# Patient Record
Sex: Female | Born: 1935
Health system: Southern US, Community
[De-identification: ages and names within clinical notes are randomized; demographics above are authoritative.]

## PROBLEM LIST (undated history)

## (undated) DIAGNOSIS — G459 Transient cerebral ischemic attack, unspecified: Secondary | ICD-10-CM

## (undated) DIAGNOSIS — M1990 Unspecified osteoarthritis, unspecified site: Secondary | ICD-10-CM

## (undated) DIAGNOSIS — I10 Essential (primary) hypertension: Secondary | ICD-10-CM

## (undated) DIAGNOSIS — F419 Anxiety disorder, unspecified: Secondary | ICD-10-CM

## (undated) DIAGNOSIS — E785 Hyperlipidemia, unspecified: Secondary | ICD-10-CM

## (undated) DIAGNOSIS — K645 Perianal venous thrombosis: Secondary | ICD-10-CM

## (undated) DIAGNOSIS — C50919 Malignant neoplasm of unspecified site of unspecified female breast: Secondary | ICD-10-CM

## (undated) DIAGNOSIS — R05 Cough: Secondary | ICD-10-CM

## (undated) DIAGNOSIS — K219 Gastro-esophageal reflux disease without esophagitis: Secondary | ICD-10-CM

## (undated) DIAGNOSIS — K222 Esophageal obstruction: Secondary | ICD-10-CM

## (undated) DIAGNOSIS — R059 Cough, unspecified: Secondary | ICD-10-CM

## (undated) HISTORY — DX: Perianal venous thrombosis: K64.5

## (undated) HISTORY — PX: ABDOMINAL HYSTERECTOMY: SHX81

## (undated) HISTORY — DX: Esophageal obstruction: K22.2

## (undated) HISTORY — PX: TONSILLECTOMY: SUR1361

## (undated) HISTORY — PX: MASTECTOMY, PARTIAL: SHX709

## (undated) HISTORY — PX: CYSTECTOMY: SUR359

## (undated) HISTORY — PX: HEEL SPUR SURGERY: SHX665

## (undated) HISTORY — DX: Cough, unspecified: R05.9

## (undated) HISTORY — DX: Cough: R05

## (undated) HISTORY — DX: Essential (primary) hypertension: I10

## (undated) HISTORY — DX: Malignant neoplasm of unspecified site of unspecified female breast: C50.919

## (undated) HISTORY — DX: Gastro-esophageal reflux disease without esophagitis: K21.9

## (undated) HISTORY — DX: Anxiety disorder, unspecified: F41.9

## (undated) HISTORY — DX: Hyperlipidemia, unspecified: E78.5

## (undated) HISTORY — DX: Transient cerebral ischemic attack, unspecified: G45.9

## (undated) HISTORY — DX: Unspecified osteoarthritis, unspecified site: M19.90

---

## 1998-03-15 ENCOUNTER — Ambulatory Visit (HOSPITAL_COMMUNITY): Admission: RE | Admit: 1998-03-15 | Discharge: 1998-03-15 | Payer: Self-pay | Admitting: Cardiology

## 1998-03-30 ENCOUNTER — Observation Stay (HOSPITAL_COMMUNITY): Admission: RE | Admit: 1998-03-30 | Discharge: 1998-03-31 | Payer: Self-pay | Admitting: Surgery

## 1999-10-16 ENCOUNTER — Encounter: Admission: RE | Admit: 1999-10-16 | Discharge: 1999-10-16 | Payer: Self-pay | Admitting: Cardiology

## 1999-10-16 ENCOUNTER — Encounter: Payer: Self-pay | Admitting: Cardiology

## 2000-10-07 HISTORY — PX: CHOLECYSTECTOMY: SHX55

## 2000-10-17 ENCOUNTER — Encounter: Admission: RE | Admit: 2000-10-17 | Discharge: 2000-10-17 | Payer: Self-pay | Admitting: Internal Medicine

## 2000-10-17 ENCOUNTER — Encounter: Payer: Self-pay | Admitting: Internal Medicine

## 2000-12-16 ENCOUNTER — Encounter: Payer: Self-pay | Admitting: Internal Medicine

## 2000-12-16 ENCOUNTER — Ambulatory Visit (HOSPITAL_COMMUNITY): Admission: RE | Admit: 2000-12-16 | Discharge: 2000-12-16 | Payer: Self-pay | Admitting: Internal Medicine

## 2001-02-03 ENCOUNTER — Other Ambulatory Visit: Admission: RE | Admit: 2001-02-03 | Discharge: 2001-02-03 | Payer: Self-pay | Admitting: Internal Medicine

## 2001-04-21 ENCOUNTER — Encounter: Admission: RE | Admit: 2001-04-21 | Discharge: 2001-04-21 | Payer: Self-pay | Admitting: *Deleted

## 2001-04-21 ENCOUNTER — Encounter: Payer: Self-pay | Admitting: *Deleted

## 2001-07-31 ENCOUNTER — Encounter: Payer: Self-pay | Admitting: *Deleted

## 2001-07-31 ENCOUNTER — Encounter: Admission: RE | Admit: 2001-07-31 | Discharge: 2001-07-31 | Payer: Self-pay | Admitting: *Deleted

## 2001-10-19 ENCOUNTER — Encounter: Admission: RE | Admit: 2001-10-19 | Discharge: 2001-10-19 | Payer: Self-pay | Admitting: Internal Medicine

## 2001-10-19 ENCOUNTER — Encounter: Payer: Self-pay | Admitting: Internal Medicine

## 2002-10-20 ENCOUNTER — Encounter: Admission: RE | Admit: 2002-10-20 | Discharge: 2002-10-20 | Payer: Self-pay | Admitting: Internal Medicine

## 2002-10-20 ENCOUNTER — Encounter: Payer: Self-pay | Admitting: Internal Medicine

## 2002-10-22 ENCOUNTER — Encounter: Payer: Self-pay | Admitting: Internal Medicine

## 2002-10-22 ENCOUNTER — Encounter: Admission: RE | Admit: 2002-10-22 | Discharge: 2002-10-22 | Payer: Self-pay | Admitting: Internal Medicine

## 2002-10-27 ENCOUNTER — Ambulatory Visit (HOSPITAL_COMMUNITY): Admission: RE | Admit: 2002-10-27 | Discharge: 2002-10-27 | Payer: Self-pay | Admitting: Internal Medicine

## 2002-10-27 ENCOUNTER — Encounter: Payer: Self-pay | Admitting: Internal Medicine

## 2003-03-31 ENCOUNTER — Encounter: Payer: Self-pay | Admitting: Internal Medicine

## 2003-03-31 ENCOUNTER — Encounter: Admission: RE | Admit: 2003-03-31 | Discharge: 2003-03-31 | Payer: Self-pay | Admitting: Internal Medicine

## 2004-04-17 ENCOUNTER — Encounter: Admission: RE | Admit: 2004-04-17 | Discharge: 2004-04-17 | Payer: Self-pay | Admitting: Internal Medicine

## 2004-08-08 ENCOUNTER — Ambulatory Visit: Payer: Self-pay | Admitting: Internal Medicine

## 2004-08-21 ENCOUNTER — Ambulatory Visit: Payer: Self-pay | Admitting: Internal Medicine

## 2004-08-21 ENCOUNTER — Encounter (INDEPENDENT_AMBULATORY_CARE_PROVIDER_SITE_OTHER): Payer: Self-pay | Admitting: *Deleted

## 2005-04-24 ENCOUNTER — Encounter: Admission: RE | Admit: 2005-04-24 | Discharge: 2005-04-24 | Payer: Self-pay | Admitting: Internal Medicine

## 2005-05-11 ENCOUNTER — Emergency Department (HOSPITAL_COMMUNITY): Admission: EM | Admit: 2005-05-11 | Discharge: 2005-05-11 | Payer: Self-pay | Admitting: Emergency Medicine

## 2006-04-29 ENCOUNTER — Encounter: Admission: RE | Admit: 2006-04-29 | Discharge: 2006-04-29 | Payer: Self-pay | Admitting: Internal Medicine

## 2007-05-01 ENCOUNTER — Encounter: Admission: RE | Admit: 2007-05-01 | Discharge: 2007-05-01 | Payer: Self-pay | Admitting: Internal Medicine

## 2007-05-08 ENCOUNTER — Ambulatory Visit (HOSPITAL_COMMUNITY): Admission: RE | Admit: 2007-05-08 | Discharge: 2007-05-08 | Payer: Self-pay | Admitting: Internal Medicine

## 2007-08-27 ENCOUNTER — Encounter (HOSPITAL_COMMUNITY): Admission: RE | Admit: 2007-08-27 | Discharge: 2007-10-07 | Payer: Self-pay | Admitting: Internal Medicine

## 2008-01-13 ENCOUNTER — Encounter: Admission: RE | Admit: 2008-01-13 | Discharge: 2008-01-13 | Payer: Self-pay | Admitting: Internal Medicine

## 2008-04-15 DIAGNOSIS — K222 Esophageal obstruction: Secondary | ICD-10-CM

## 2008-04-15 DIAGNOSIS — E785 Hyperlipidemia, unspecified: Secondary | ICD-10-CM | POA: Insufficient documentation

## 2008-04-15 DIAGNOSIS — M129 Arthropathy, unspecified: Secondary | ICD-10-CM | POA: Insufficient documentation

## 2008-04-15 DIAGNOSIS — K219 Gastro-esophageal reflux disease without esophagitis: Secondary | ICD-10-CM

## 2008-04-15 DIAGNOSIS — I10 Essential (primary) hypertension: Secondary | ICD-10-CM | POA: Insufficient documentation

## 2008-04-15 DIAGNOSIS — G459 Transient cerebral ischemic attack, unspecified: Secondary | ICD-10-CM | POA: Insufficient documentation

## 2008-04-18 ENCOUNTER — Ambulatory Visit: Payer: Self-pay | Admitting: Internal Medicine

## 2008-04-18 DIAGNOSIS — K644 Residual hemorrhoidal skin tags: Secondary | ICD-10-CM | POA: Insufficient documentation

## 2008-04-18 DIAGNOSIS — K5900 Constipation, unspecified: Secondary | ICD-10-CM | POA: Insufficient documentation

## 2008-04-18 LAB — CONVERTED CEMR LAB
Basophils Relative: 0.4 % (ref 0.0–1.0)
Eosinophils Absolute: 0.2 10*3/uL (ref 0.0–0.7)
Eosinophils Relative: 3.3 % (ref 0.0–5.0)
HCT: 39.4 % (ref 36.0–46.0)
Lymphocytes Relative: 31.1 % (ref 12.0–46.0)
MCV: 88 fL (ref 78.0–100.0)
Neutro Abs: 4.1 10*3/uL (ref 1.4–7.7)
Platelets: 280 10*3/uL (ref 150–400)
RBC: 4.48 M/uL (ref 3.87–5.11)

## 2008-04-19 ENCOUNTER — Ambulatory Visit: Payer: Self-pay | Admitting: Internal Medicine

## 2008-04-25 ENCOUNTER — Ambulatory Visit: Payer: Self-pay | Admitting: Internal Medicine

## 2008-04-28 LAB — CONVERTED CEMR LAB
OCCULT 1: POSITIVE — AB
OCCULT 2: POSITIVE — AB
OCCULT 4: NEGATIVE

## 2008-05-03 ENCOUNTER — Encounter: Admission: RE | Admit: 2008-05-03 | Discharge: 2008-05-03 | Payer: Self-pay | Admitting: Internal Medicine

## 2008-10-07 HISTORY — PX: BREAST LUMPECTOMY: SHX2

## 2009-05-05 ENCOUNTER — Encounter: Admission: RE | Admit: 2009-05-05 | Discharge: 2009-05-05 | Payer: Self-pay | Admitting: Internal Medicine

## 2009-05-11 ENCOUNTER — Encounter (INDEPENDENT_AMBULATORY_CARE_PROVIDER_SITE_OTHER): Payer: Self-pay | Admitting: Internal Medicine

## 2009-05-11 ENCOUNTER — Encounter: Admission: RE | Admit: 2009-05-11 | Discharge: 2009-05-11 | Payer: Self-pay | Admitting: Internal Medicine

## 2009-05-17 ENCOUNTER — Encounter: Admission: RE | Admit: 2009-05-17 | Discharge: 2009-05-17 | Payer: Self-pay | Admitting: Internal Medicine

## 2009-05-29 ENCOUNTER — Encounter (INDEPENDENT_AMBULATORY_CARE_PROVIDER_SITE_OTHER): Payer: Self-pay | Admitting: General Surgery

## 2009-05-29 ENCOUNTER — Encounter: Admission: RE | Admit: 2009-05-29 | Discharge: 2009-05-29 | Payer: Self-pay | Admitting: General Surgery

## 2009-05-29 ENCOUNTER — Ambulatory Visit (HOSPITAL_COMMUNITY): Admission: RE | Admit: 2009-05-29 | Discharge: 2009-05-29 | Payer: Self-pay | Admitting: General Surgery

## 2009-06-07 ENCOUNTER — Ambulatory Visit: Payer: Self-pay | Admitting: Oncology

## 2009-06-14 LAB — CBC WITH DIFFERENTIAL/PLATELET
BASO%: 0.6 % (ref 0.0–2.0)
EOS%: 6.9 % (ref 0.0–7.0)
LYMPH%: 32 % (ref 14.0–49.7)
MCH: 30 pg (ref 25.1–34.0)
MCHC: 34.6 g/dL (ref 31.5–36.0)
MONO#: 0.5 10*3/uL (ref 0.1–0.9)
NEUT%: 52.4 % (ref 38.4–76.8)
Platelets: 260 10*3/uL (ref 145–400)
RBC: 4.52 10*6/uL (ref 3.70–5.45)
WBC: 5.7 10*3/uL (ref 3.9–10.3)
lymph#: 1.8 10*3/uL (ref 0.9–3.3)

## 2009-06-15 LAB — COMPREHENSIVE METABOLIC PANEL
ALT: 20 U/L (ref 0–35)
AST: 22 U/L (ref 0–37)
CO2: 27 mEq/L (ref 19–32)
Creatinine, Ser: 1.09 mg/dL (ref 0.40–1.20)
Sodium: 142 mEq/L (ref 135–145)
Total Bilirubin: 0.5 mg/dL (ref 0.3–1.2)
Total Protein: 6.7 g/dL (ref 6.0–8.3)

## 2009-06-15 LAB — LACTATE DEHYDROGENASE: LDH: 175 U/L (ref 94–250)

## 2009-06-23 ENCOUNTER — Ambulatory Visit (HOSPITAL_COMMUNITY): Admission: RE | Admit: 2009-06-23 | Discharge: 2009-06-23 | Payer: Self-pay | Admitting: Oncology

## 2009-09-22 ENCOUNTER — Ambulatory Visit: Payer: Self-pay | Admitting: Oncology

## 2009-09-26 LAB — CBC WITH DIFFERENTIAL/PLATELET
BASO%: 0.7 % (ref 0.0–2.0)
Eosinophils Absolute: 0.2 10*3/uL (ref 0.0–0.5)
MONO#: 0.4 10*3/uL (ref 0.1–0.9)
NEUT#: 3.8 10*3/uL (ref 1.5–6.5)
RBC: 4.59 10*6/uL (ref 3.70–5.45)
RDW: 12.7 % (ref 11.2–14.5)
WBC: 6.6 10*3/uL (ref 3.9–10.3)
lymph#: 2.2 10*3/uL (ref 0.9–3.3)

## 2009-09-26 LAB — COMPREHENSIVE METABOLIC PANEL
ALT: 19 U/L (ref 0–35)
Albumin: 4.3 g/dL (ref 3.5–5.2)
CO2: 27 mEq/L (ref 19–32)
Glucose, Bld: 104 mg/dL — ABNORMAL HIGH (ref 70–99)
Potassium: 3.8 mEq/L (ref 3.5–5.3)
Sodium: 139 mEq/L (ref 135–145)
Total Protein: 7.2 g/dL (ref 6.0–8.3)

## 2009-09-26 LAB — VITAMIN D 25 HYDROXY (VIT D DEFICIENCY, FRACTURES): Vit D, 25-Hydroxy: 40 ng/mL (ref 30–89)

## 2010-03-20 ENCOUNTER — Ambulatory Visit: Payer: Self-pay | Admitting: Oncology

## 2010-03-22 LAB — CBC WITH DIFFERENTIAL/PLATELET
BASO%: 0.5 % (ref 0.0–2.0)
Basophils Absolute: 0 10*3/uL (ref 0.0–0.1)
EOS%: 1.4 % (ref 0.0–7.0)
HGB: 13.4 g/dL (ref 11.6–15.9)
LYMPH%: 28.3 % (ref 14.0–49.7)
MONO%: 7.6 % (ref 0.0–14.0)
RBC: 4.63 10*6/uL (ref 3.70–5.45)
RDW: 12.6 % (ref 11.2–14.5)
lymph#: 1.7 10*3/uL (ref 0.9–3.3)

## 2010-03-23 LAB — COMPREHENSIVE METABOLIC PANEL
AST: 24 U/L (ref 0–37)
BUN: 21 mg/dL (ref 6–23)
CO2: 27 mEq/L (ref 19–32)
Calcium: 9.4 mg/dL (ref 8.4–10.5)
Potassium: 4 mEq/L (ref 3.5–5.3)
Sodium: 138 mEq/L (ref 135–145)
Total Bilirubin: 0.5 mg/dL (ref 0.3–1.2)

## 2010-05-30 ENCOUNTER — Encounter: Admission: RE | Admit: 2010-05-30 | Discharge: 2010-05-30 | Payer: Self-pay | Admitting: Oncology

## 2010-09-19 ENCOUNTER — Ambulatory Visit: Payer: Self-pay | Admitting: Oncology

## 2010-09-21 LAB — CBC WITH DIFFERENTIAL/PLATELET
BASO%: 0.3 % (ref 0.0–2.0)
Basophils Absolute: 0 10*3/uL (ref 0.0–0.1)
HCT: 39.4 % (ref 34.8–46.6)
LYMPH%: 23.5 % (ref 14.0–49.7)
MCH: 30 pg (ref 25.1–34.0)
MCHC: 33.9 g/dL (ref 31.5–36.0)
MONO#: 0.5 10*3/uL (ref 0.1–0.9)
NEUT#: 5 10*3/uL (ref 1.5–6.5)
NEUT%: 68.3 % (ref 38.4–76.8)
Platelets: 259 10*3/uL (ref 145–400)
RBC: 4.45 10*6/uL (ref 3.70–5.45)
WBC: 7.3 10*3/uL (ref 3.9–10.3)
lymph#: 1.7 10*3/uL (ref 0.9–3.3)

## 2010-09-22 LAB — LACTATE DEHYDROGENASE: LDH: 164 U/L (ref 94–250)

## 2010-09-22 LAB — COMPREHENSIVE METABOLIC PANEL
Albumin: 4.2 g/dL (ref 3.5–5.2)
BUN: 26 mg/dL — ABNORMAL HIGH (ref 6–23)
Calcium: 9.6 mg/dL (ref 8.4–10.5)
Chloride: 101 mEq/L (ref 96–112)
Glucose, Bld: 85 mg/dL (ref 70–99)
Potassium: 4.1 mEq/L (ref 3.5–5.3)

## 2010-09-22 LAB — CANCER ANTIGEN 27.29: CA 27.29: 25 U/mL (ref 0–39)

## 2011-01-12 LAB — CBC
HCT: 40.4 % (ref 36.0–46.0)
MCV: 88.9 fL (ref 78.0–100.0)
Platelets: 241 10*3/uL (ref 150–400)
WBC: 8.2 10*3/uL (ref 4.0–10.5)

## 2011-01-12 LAB — DIFFERENTIAL
Basophils Absolute: 0 10*3/uL (ref 0.0–0.1)
Lymphocytes Relative: 28 % (ref 12–46)
Neutro Abs: 4.9 10*3/uL (ref 1.7–7.7)
Neutrophils Relative %: 60 % (ref 43–77)

## 2011-01-12 LAB — COMPREHENSIVE METABOLIC PANEL
Albumin: 4 g/dL (ref 3.5–5.2)
BUN: 18 mg/dL (ref 6–23)
CO2: 30 mEq/L (ref 19–32)
Chloride: 100 mEq/L (ref 96–112)
Creatinine, Ser: 1.08 mg/dL (ref 0.4–1.2)
GFR calc non Af Amer: 49 mL/min — ABNORMAL LOW (ref >60)
Glucose, Bld: 101 mg/dL — ABNORMAL HIGH (ref 70–99)
Total Bilirubin: 0.8 mg/dL (ref 0.3–1.2)

## 2011-01-12 LAB — URINE MICROSCOPIC-ADD ON

## 2011-01-12 LAB — URINALYSIS, ROUTINE W REFLEX MICROSCOPIC
Ketones, ur: NEGATIVE mg/dL
Nitrite: NEGATIVE
Protein, ur: NEGATIVE mg/dL

## 2011-01-12 LAB — LACTATE DEHYDROGENASE: LDH: 171 U/L (ref 94–250)

## 2011-02-15 ENCOUNTER — Encounter (INDEPENDENT_AMBULATORY_CARE_PROVIDER_SITE_OTHER): Payer: Self-pay | Admitting: General Surgery

## 2011-02-19 NOTE — Op Note (Signed)
[name], [name]               ACCOUNT NO.:  [id number]   MEDICAL RECORD NO.:  [id number]          PATIENT TYPE:  AMB   LOCATION:  SDS                          FACILITY:  [facility]   PHYSICIAN:  Angelia Mould. Derrell Lolling, M.D.DATE OF BIRTH:  1926/08/25   DATE OF PROCEDURE:  05/29/2009  DATE OF DISCHARGE:                               OPERATIVE REPORT   PREOPERATIVE DIAGNOSIS:  Invasive ductal carcinoma left breast, clinical  stage T1cN0.   POSTOPERATIVE DIAGNOSIS:  Invasive ductal carcinoma left breast,  clinical stage T1cN0.   OPERATION PERFORMED:  1. Inject blue dye left breast.  2. Left partial mastectomy with needle localization and specimen      mammogram.  3. Left axillary sentinel node mapping and biopsy.   SURGEON:  Angelia Mould. Derrell Lolling, MD   OPERATIVE INDICATIONS:  This is an 77 year old Caucasian female who had  screening mammograms recently which showed a small spiculated mass in  the left breast at the 1 o'clock position.  This was thought to be about  1.3 cm in maximum dimension.  Image-guided biopsy showed intermediate  grade invasive ductal carcinoma.  Her MRI showed a 1.9-cm mass in the  anterior third of the upper central left breast with a clip artifact.  This was a solitary lesion.  She was interested in breast conservation  surgery.  I discussed that with her.  I told her I thought she was a  reasonable candidate for that.  She is brought to the operating room  electively.   OPERATIVE TECHNIQUE:  The patient underwent wire localization at the  [facility] and the wire was placed from superiorly and  directed inferiorly and posteriorly and I went right through the marker  clip.  The patient was brought to the holding area at [facility].  Nuclear medicine technician injected the radionuclide into the left  subareolar area in the holding area.  The patient was then taken to the  operating room.  General anesthesia with an LMA device was used.  The  patient was identified as the correct patient, correct procedure and  correct site.  Following an alcohol prep, I injected 5 mL of blue dye  into the left subareolar area.  This was 2 mL of methylene blue mixed  with 3 mL of saline.  The breast was massaged for 5 minutes.   The left breast, shoulder, axilla and chest wall were then prepped and  draped in a sterile fashion.  Intravenous antibiotics were given.  Marcaine 0.5% with epinephrine was used as a local infiltration  anesthetic.  I used a marking pen and drew a somewhat transverse  circumareolar elliptical incision in the upper central left breast.  This encompassed the localizing wire.  This incision was made and  carried down deep into the breast tissue all around the localizing wire.  Inferiorly, I found some old blood thought to be left over hematoma from  the old biopsy and I had gone widely around this.  The specimen was  marked with the six color margin marker kit.  This was sent to the  [facility].  The radiologist took a look at the  specimen and said that the wire clip and the tumor was in the exact  center of the specimen and it looked very good.  The specimen was sent  to pathology for routine histology.  The left lumpectomy wound was  irrigated with saline.  Hemostasis was excellent and achieved with  electrocautery.  The deeper breast tissues were closed with interrupted  sutures of 3-0 Vicryl, and the skin closed with a running subcuticular  suture of 4-0 Monocryl and Dermabond.   We then used the NeoProbe and identified an area of focal radioactivity  in the left axilla.  Transverse incision was made at this site which was  about at the hairline.  Dissection was carried down through the  subcutaneous tissue.  The clavipectoral fascia was incised and we  entered the axilla.  We traced out and found one very hot very blue  lymph node and we found a second sentinel node which was hot but was not   blue.  These were both sent for routine histology.  The left axillary  wound looked good.  Hemostasis was excellent.  The deeper tissues were  closed with interrupted sutures of 3-0 Vicryl, and the skin closed with  a running subcuticular suture of 4-0 Monocryl and Dermabond.  The  patient was taken to the recovery room in stable condition.  Estimated  blood loss was about 25 mL.  Complications none.  Sponge, needle and  instrument counts were correct.      Angelia Mould. Derrell Lolling, M.D.  Electronically Signed     HMI/MEDQ  D:  05/29/2009  T:  05/30/2009  Job:  [id number]   cc:   Loraine Leriche A. Perini, M.D.

## 2011-03-26 ENCOUNTER — Other Ambulatory Visit: Payer: Self-pay | Admitting: Oncology

## 2011-03-26 ENCOUNTER — Encounter (HOSPITAL_BASED_OUTPATIENT_CLINIC_OR_DEPARTMENT_OTHER): Payer: BC Managed Care – HMO | Admitting: Oncology

## 2011-03-26 DIAGNOSIS — C50119 Malignant neoplasm of central portion of unspecified female breast: Secondary | ICD-10-CM

## 2011-03-26 LAB — CBC WITH DIFFERENTIAL/PLATELET
Basophils Absolute: 0 10*3/uL (ref 0.0–0.1)
Eosinophils Absolute: 0.2 10*3/uL (ref 0.0–0.5)
HGB: 13.1 g/dL (ref 11.6–15.9)
MCV: 87.1 fL (ref 79.5–101.0)
MONO%: 10.4 % (ref 0.0–14.0)
NEUT#: 2.7 10*3/uL (ref 1.5–6.5)
Platelets: 243 10*3/uL (ref 145–400)
RDW: 12.7 % (ref 11.2–14.5)

## 2011-03-27 LAB — VITAMIN D 25 HYDROXY (VIT D DEFICIENCY, FRACTURES): Vit D, 25-Hydroxy: 63 ng/mL (ref 30–89)

## 2011-03-27 LAB — COMPREHENSIVE METABOLIC PANEL
Albumin: 4.4 g/dL (ref 3.5–5.2)
Alkaline Phosphatase: 53 U/L (ref 39–117)
BUN: 26 mg/dL — ABNORMAL HIGH (ref 6–23)
CO2: 27 mEq/L (ref 19–32)
Calcium: 10.6 mg/dL — ABNORMAL HIGH (ref 8.4–10.5)
Glucose, Bld: 102 mg/dL — ABNORMAL HIGH (ref 70–99)
Potassium: 4 mEq/L (ref 3.5–5.3)

## 2011-04-02 ENCOUNTER — Encounter: Payer: Medicare Other | Admitting: Oncology

## 2011-04-02 ENCOUNTER — Other Ambulatory Visit: Payer: Self-pay | Admitting: Oncology

## 2011-04-02 DIAGNOSIS — Z853 Personal history of malignant neoplasm of breast: Secondary | ICD-10-CM

## 2011-05-29 ENCOUNTER — Encounter (INDEPENDENT_AMBULATORY_CARE_PROVIDER_SITE_OTHER): Payer: Self-pay | Admitting: General Surgery

## 2011-06-03 ENCOUNTER — Ambulatory Visit
Admission: RE | Admit: 2011-06-03 | Discharge: 2011-06-03 | Disposition: A | Discharge status: Home / Self Care | Payer: Medicare Other | Source: Ambulatory Visit | Attending: Oncology | Admitting: Oncology

## 2011-06-03 ENCOUNTER — Other Ambulatory Visit: Payer: Self-pay | Admitting: Oncology

## 2011-06-03 DIAGNOSIS — Z853 Personal history of malignant neoplasm of breast: Secondary | ICD-10-CM

## 2011-06-03 DIAGNOSIS — R921 Mammographic calcification found on diagnostic imaging of breast: Secondary | ICD-10-CM

## 2011-06-04 ENCOUNTER — Ambulatory Visit
Admission: RE | Admit: 2011-06-04 | Discharge: 2011-06-04 | Disposition: A | Discharge status: Home / Self Care | Payer: Medicare Other | Source: Ambulatory Visit | Attending: Oncology | Admitting: Oncology

## 2011-06-04 DIAGNOSIS — R921 Mammographic calcification found on diagnostic imaging of breast: Secondary | ICD-10-CM

## 2011-06-05 ENCOUNTER — Other Ambulatory Visit: Payer: Self-pay | Admitting: Oncology

## 2011-06-05 DIAGNOSIS — R921 Mammographic calcification found on diagnostic imaging of breast: Secondary | ICD-10-CM

## 2011-06-20 ENCOUNTER — Encounter (INDEPENDENT_AMBULATORY_CARE_PROVIDER_SITE_OTHER): Payer: Self-pay | Admitting: General Surgery

## 2011-06-20 ENCOUNTER — Ambulatory Visit (INDEPENDENT_AMBULATORY_CARE_PROVIDER_SITE_OTHER): Payer: Medicare Other | Admitting: General Surgery

## 2011-06-20 ENCOUNTER — Other Ambulatory Visit (INDEPENDENT_AMBULATORY_CARE_PROVIDER_SITE_OTHER): Payer: Self-pay | Admitting: General Surgery

## 2011-06-20 VITALS — BP 130/70 | HR 64 | Temp 97.4°F | Ht 65.75 in | Wt 167.8 lb

## 2011-06-20 DIAGNOSIS — R921 Mammographic calcification found on diagnostic imaging of breast: Secondary | ICD-10-CM

## 2011-06-20 DIAGNOSIS — Z853 Personal history of malignant neoplasm of breast: Secondary | ICD-10-CM

## 2011-06-20 DIAGNOSIS — N6089 Other benign mammary dysplasias of unspecified breast: Secondary | ICD-10-CM

## 2011-06-20 DIAGNOSIS — N6099 Unspecified benign mammary dysplasia of unspecified breast: Secondary | ICD-10-CM

## 2011-06-20 NOTE — Patient Instructions (Signed)
The recent biopsy of your left breast revealed atypical ductal hyperplasia, which is a precancerous condition. The area of calcifications behind the lumpectomy site need to be completely removed to find out whether there is any cancer present or not. I think that this is the next step. We will schedule the surgery to be done in the near future.

## 2011-06-20 NOTE — Progress Notes (Signed)
Chief Complaint  Patient presents with  . Other    est pt new prob- eval lt br atypical hyperplasia     HPI [name] is a 77 y.o. female.    This patient underwent left partial mastectomy and left sentinel node biopsy on May 29, 2009. Final pathologic stage was T1c., N0, (i positive), receptor positive, Ki-67 60%, HER-2-negative.  She had adjuvant radiation therapy. Dr. Donnie Coffin has been seeinn her every 6 months. She has had lots of side effects from her antiestrogen therapy and is known exemestane..  She had mammograms on June 03, 2011. This showed an increasing area of microcalcifications posterior to the left lumpectomy site. Image guided biopsy showed atypical ductal hyperplasia. She was referred to me for consideration of excisional biopsy to rule out occult carcinoma.  She has asked me whether she needs a mastectomy, and I have told her that that does not appear to be necessary, at least at this point in time. HPI  Past Medical History  Diagnosis Date  . Hyperlipidemia   . Hypertension   . Cancer     breast - lft    Past Surgical History  Procedure Date  . Abdominal hysterectomy   . Cystectomy in her 20's  . Cholecystectomy 2002  . Tonsillectomy   . Breast lumpectomy 2010    left    Family History  Problem Relation Age of Onset  . Cancer Brother     lung  . Cancer Sister     cervical    Social History History  Substance Use Topics  . Smoking status: Never Smoker   . Smokeless tobacco: Not on file  . Alcohol Use: No    Allergies  Allergen Reactions  . Ibandronate Sodium     REACTION: joint aches  . Sulfonamide Derivatives     Current Outpatient Prescriptions  Medication Sig Dispense Refill  . ALPRAZolam (XANAX) 0.5 MG tablet Take 0.5 mg by mouth at bedtime as needed.        Marland Kitchen amitriptyline (ELAVIL) 50 MG tablet daily.      . ATENOLOL PO Take by mouth.        Marland Kitchen BIOTIN PO Take by mouth daily.        . calcium carbonate (OS-CAL) 600 MG  TABS Take 600 mg by mouth daily.        . Cholecalciferol (VITAMIN D3) 2000 UNITS TABS Take by mouth daily.        Marland Kitchen exemestane (AROMASIN) 25 MG tablet daily.      . felodipine (PLENDIL) 10 MG 24 hr tablet daily.      . fish oil-omega-3 fatty acids 1000 MG capsule Take 2 g by mouth daily.        . furosemide (LASIX) 40 MG tablet daily.      Marland Kitchen levothyroxine (SYNTHROID, LEVOTHROID) 50 MCG tablet Ad lib.      Ailene Ards 3-6-9 Fatty Acids (TRIPLE OMEGA COMPLEX PO) Take by mouth daily.        . potassium chloride SA (K-DUR,KLOR-CON) 20 MEQ tablet daily.      . Ranitidine HCl (ZANTAC PO) Take by mouth.        . Rosuvastatin Calcium (CRESTOR PO) Take by mouth.        . Valsartan (DIOVAN PO) Take by mouth.          Review of Systems Review of Systems 12 system review of systems is performed and is negative except as described above. Blood pressure 130/70,  pulse 64, temperature 97.4 F (36.3 C), temperature source Temporal, height 5' 5.75" (1.67 m), weight 167 lb 12.8 oz (76.114 kg).  Physical Exam Physical Exam The patient is elderly, slightly deconditioned, and ambulating independently and otherwise alert and has good insight into her problems.  Neck reveals no adenopathy or mass. There is no jugular venous distention.  Lungs clear to auscultation her chest wall tenderness.  Heart regular rate and rhythm. No ectopy. No murmur.  Left breast reveals a well-healed transverse lobectomy scar superiorly. I do not feel any masses in either breast. There is no skin change in either breast otherwise. There is no axillary adenopathy. Data Reviewed I have reviewed her recent mammograms and the mammogram report. I reviewed the pathology  report and Dr. Renelda Loma recent office notes.  Assessment    Atypical ductal hyperplasia, left breast, posterior to lumpectomy site. This area should be excised to exclude occult carcinoma.  Invasive carcinoma left breast, pathologic stage TIc., N0, receptor positive,  HER-2-negative. Status post left partial mastectomy and sentinel biopsy May 29, 2009.  Hypertension.  Status post hysterectomy and right salpingo-oophorectomy.  Status post cholecystectomy.  Anxiety.     Plan    We had a long discussion. We decided to proceed with left partial mastectomy with needle localization in the near future.  We discussed the indications and details of the surgery. Risks and complications  were outlined, including but not limited to bleeding, infection, cosmetic deformity, reoperation if cancer is found, cardiac pulmonary and thromboembolic problems. She understands these issues well. At this time all of her questions are answered. She is in full agreement with this plan.       Rickard Kennerly M 06/20/2011, 10:23 AM

## 2011-06-26 ENCOUNTER — Encounter (HOSPITAL_COMMUNITY)
Admission: RE | Admit: 2011-06-26 | Discharge: 2011-06-26 | Disposition: A | Discharge status: Home / Self Care | Payer: Medicare Other | Source: Ambulatory Visit | Attending: General Surgery | Admitting: General Surgery

## 2011-06-26 ENCOUNTER — Other Ambulatory Visit (INDEPENDENT_AMBULATORY_CARE_PROVIDER_SITE_OTHER): Payer: Self-pay | Admitting: General Surgery

## 2011-06-26 DIAGNOSIS — R921 Mammographic calcification found on diagnostic imaging of breast: Secondary | ICD-10-CM

## 2011-06-26 LAB — DIFFERENTIAL
Basophils Relative: 1 % (ref 0–1)
Lymphocytes Relative: 31 % (ref 12–46)
Lymphs Abs: 2.1 10*3/uL (ref 0.7–4.0)
Monocytes Relative: 12 % (ref 3–12)
Neutro Abs: 3.3 10*3/uL (ref 1.7–7.7)
Neutrophils Relative %: 50 % (ref 43–77)

## 2011-06-26 LAB — CBC
HCT: 38.7 % (ref 36.0–46.0)
Hemoglobin: 13.4 g/dL (ref 12.0–15.0)
MCH: 29.7 pg (ref 26.0–34.0)
MCV: 85.8 fL (ref 78.0–100.0)
RBC: 4.51 MIL/uL (ref 3.87–5.11)

## 2011-06-26 LAB — COMPREHENSIVE METABOLIC PANEL
ALT: 20 U/L (ref 0–35)
BUN: 22 mg/dL (ref 6–23)
CO2: 31 mEq/L (ref 19–32)
Calcium: 10.2 mg/dL (ref 8.4–10.5)
GFR calc Af Amer: 46 mL/min — ABNORMAL LOW (ref >60)
GFR calc non Af Amer: 38 mL/min — ABNORMAL LOW (ref >60)
Glucose, Bld: 98 mg/dL (ref 70–99)
Sodium: 141 mEq/L (ref 135–145)

## 2011-06-26 LAB — SURGICAL PCR SCREEN: MRSA, PCR: NEGATIVE

## 2011-07-01 ENCOUNTER — Ambulatory Visit (INDEPENDENT_AMBULATORY_CARE_PROVIDER_SITE_OTHER): Payer: Medicare Other | Admitting: General Surgery

## 2011-07-03 ENCOUNTER — Ambulatory Visit
Admission: RE | Admit: 2011-07-03 | Discharge: 2011-07-03 | Disposition: A | Discharge status: Home / Self Care | Payer: Medicare Other | Source: Ambulatory Visit | Attending: General Surgery | Admitting: General Surgery

## 2011-07-03 ENCOUNTER — Other Ambulatory Visit (INDEPENDENT_AMBULATORY_CARE_PROVIDER_SITE_OTHER): Payer: Self-pay | Admitting: General Surgery

## 2011-07-03 ENCOUNTER — Ambulatory Visit (HOSPITAL_COMMUNITY)
Admission: RE | Admit: 2011-07-03 | Discharge: 2011-07-03 | Disposition: A | Discharge status: Home / Self Care | Payer: Medicare Other | Source: Ambulatory Visit | Attending: General Surgery | Admitting: General Surgery

## 2011-07-03 DIAGNOSIS — R920 Mammographic microcalcification found on diagnostic imaging of breast: Secondary | ICD-10-CM

## 2011-07-03 DIAGNOSIS — R921 Mammographic calcification found on diagnostic imaging of breast: Secondary | ICD-10-CM

## 2011-07-03 DIAGNOSIS — Z01818 Encounter for other preprocedural examination: Secondary | ICD-10-CM | POA: Insufficient documentation

## 2011-07-03 DIAGNOSIS — Z853 Personal history of malignant neoplasm of breast: Secondary | ICD-10-CM | POA: Insufficient documentation

## 2011-07-03 DIAGNOSIS — I10 Essential (primary) hypertension: Secondary | ICD-10-CM | POA: Insufficient documentation

## 2011-07-03 DIAGNOSIS — Z8673 Personal history of transient ischemic attack (TIA), and cerebral infarction without residual deficits: Secondary | ICD-10-CM | POA: Insufficient documentation

## 2011-07-03 DIAGNOSIS — Z01812 Encounter for preprocedural laboratory examination: Secondary | ICD-10-CM | POA: Insufficient documentation

## 2011-07-03 DIAGNOSIS — I498 Other specified cardiac arrhythmias: Secondary | ICD-10-CM | POA: Insufficient documentation

## 2011-07-03 DIAGNOSIS — N6089 Other benign mammary dysplasias of unspecified breast: Secondary | ICD-10-CM | POA: Insufficient documentation

## 2011-07-03 DIAGNOSIS — K219 Gastro-esophageal reflux disease without esophagitis: Secondary | ICD-10-CM | POA: Insufficient documentation

## 2011-07-03 DIAGNOSIS — Z01810 Encounter for preprocedural cardiovascular examination: Secondary | ICD-10-CM | POA: Insufficient documentation

## 2011-07-03 LAB — URINALYSIS, ROUTINE W REFLEX MICROSCOPIC
Glucose, UA: NEGATIVE mg/dL
Hgb urine dipstick: NEGATIVE
Ketones, ur: NEGATIVE mg/dL
Leukocytes, UA: NEGATIVE
Protein, ur: NEGATIVE mg/dL

## 2011-07-10 ENCOUNTER — Telehealth (INDEPENDENT_AMBULATORY_CARE_PROVIDER_SITE_OTHER): Payer: Self-pay | Admitting: General Surgery

## 2011-07-13 NOTE — Op Note (Signed)
NAMESHERMA, VANMETRE NO.:  [id number]  MEDICAL RECORD NO.:  [id number]  LOCATION:  SDSC                         FACILITY:  [facility]  PHYSICIAN:  Angelia Mould. Derrell Lolling, M.D.DATE OF BIRTH:  [birth year]  DATE OF PROCEDURE:  07/03/2011 DATE OF DISCHARGE:                              OPERATIVE REPORT   PREOPERATIVE DIAGNOSES: 1. Atypical ductal hyperplasia, left breast. 2. History left partial mastectomy and sentinel node biopsy for     invasive ductal carcinoma.  POSTOPERATIVE DIAGNOSES: 1. Atypical ductal hyperplasia, left breast. 2. History left partial mastectomy and sentinel node biopsy for     invasive ductal carcinoma.  OPERATION PERFORMED:  Left partial mastectomy with needle localization.  SURGEON:  Angelia Mould. Derrell Lolling, MD.  OPERATIVE INDICATIONS:  This is an 77 year old female who has a history of a left partial mastectomy and left sentinel node biopsy performed on May 29, 2009.  Pathologic stage of her tumor was T1c, N0, (i+), receptor positive, Ki67 60%, HER2 negative.  She had adjuvant radiation therapy and adjuvant antiestrogen therapy and has no known recurrence to date.  Recent mammograms show an area of increasing microcalcifications in the left breast, posterior and slightly lateral to the lumpectomy site.  Image-guided biopsy showed atypical ductal hyperplasia.  It was felt appropriate to completely excise this area to rule out occult breast cancer.  The patient agreed with this.  She was brought to operating room electively.  OPERATIVE TECHNIQUE:  The patient underwent wire localization of the area of microcalcifications by Dr. Cain Saupe.  The wire entered laterally and went medially through the area of interest.  The patient was then brought to [facility].  She was taken to operating room. General anesthesia was induced.  The left breast was prepped and draped in a sterile fashion.  Surgical time-out was held and identifying correct  patient, correct procedure, and correct site.  0.5% Marcaine with epinephrine was used as a local infiltration anesthetic.  A transverse radially oriented incision was made in the left breast at the 3 o'clock position.  Dissection was carried down through the breast tissue and around the localizing wire.  We took the dissection very posterior and somewhat medial as indicated by the wire and the imaging studies.  The specimen was removed and was marked with the 6-color margin marker kit.  The specimen mammogram was performed and confirmed removal of the wire intact and the area of calcifications and marker clip completely.  The specimen was sent to pathology.  Hemostasis was excellent and achieved with electrocautery.  The wound was irrigated with saline.  The deeper breast tissues were reapproximated with interrupted sutures of 3-0 Vicryl and the skin closed with running subcuticular suture of 4-0 Monocryl and Dermabond.  The patient tolerated the procedure well and was taken to recovery room in stable condition.  Estimated blood loss was about 10 mL.  Complications none. Sponge, needle, and instrument counts were correct.     Angelia Mould. Derrell Lolling, M.D.     HMI/MEDQ  D:  07/03/2011  T:  07/03/2011  Job:  [id number]  cc:   Loraine Leriche A. Perini, M.D. Pierce Crane, M.D., F.R.C.P.C. [place], MD  Electronically Signed  by Claud Kelp M.D. on 07/13/2011 04:02:50 PM

## 2011-07-15 ENCOUNTER — Telehealth (INDEPENDENT_AMBULATORY_CARE_PROVIDER_SITE_OTHER): Payer: Self-pay | Admitting: General Surgery

## 2011-07-15 NOTE — Telephone Encounter (Signed)
Message copied by Latricia Heft on Mon Jul 15, 2011  2:38 PM ------      Message from: Ernestene Mention      Created: Mon Jul 15, 2011 12:40 PM       Be sure we have called path report to patient.

## 2011-07-15 NOTE — Telephone Encounter (Signed)
Contacted the patient and gave her results to pathology.

## 2011-07-22 ENCOUNTER — Telehealth: Payer: Self-pay | Admitting: Internal Medicine

## 2011-07-22 NOTE — Telephone Encounter (Signed)
Pt states that she is having trouble with her throat, difficulty swallowing and would like to be seen. Pt states she recently had breast surgery and she thinks she started having problems when they put the tube down her throat. Pt scheduled to see Dr. Marina Goodell Friday 08/02/11@1 :45pm. Pt aware of appt date and time.

## 2011-07-29 ENCOUNTER — Encounter: Payer: Self-pay | Admitting: Internal Medicine

## 2011-08-01 ENCOUNTER — Encounter: Payer: Self-pay | Admitting: *Deleted

## 2011-08-02 ENCOUNTER — Encounter: Payer: Self-pay | Admitting: Internal Medicine

## 2011-08-02 ENCOUNTER — Ambulatory Visit (INDEPENDENT_AMBULATORY_CARE_PROVIDER_SITE_OTHER): Payer: Medicare Other | Admitting: Internal Medicine

## 2011-08-02 VITALS — BP 118/60 | HR 49 | Ht 65.0 in | Wt 160.2 lb

## 2011-08-02 DIAGNOSIS — K219 Gastro-esophageal reflux disease without esophagitis: Secondary | ICD-10-CM

## 2011-08-02 DIAGNOSIS — R1310 Dysphagia, unspecified: Secondary | ICD-10-CM

## 2011-08-02 NOTE — Progress Notes (Signed)
HISTORY OF PRESENT ILLNESS:  [name] is a 77 y.o. female with the below illicit medical history who presents today regarding swallowing difficulty. Patient was seen in 2005 for screening colonoscopy which was negative. At that time she underwent upper endoscopy to evaluate dysphagia and reflux symptoms. She was found to have mild esophagitis and a peptic stricture which was dilated with a 54 [place] Maloney dilator. She was subsequently seen in 2009 regarding recurrent solid food dysphagia. Repeat upper endoscopy was performed. No significant stricture. However, dilation was carried out with a 54 [place] Maloney dilator. He reports to me that she had improvement in symptoms at that time. Current complaints, however, somewhat different. He describes discomfort with swallowing "rough foods". Also problems with cough. She has had this for greater than one year. Not really describing classic esophageal dysphagia. Of reflux symptoms. She takes ranitidine for GERD. No difficulty with liquids. GI review of systems is otherwise remarkable for weight loss.  REVIEW OF SYSTEMS:  All non-GI ROS negative except for joint aches  Past Medical History  Diagnosis Date  . Hyperlipidemia   . Hypertension   . Breast cancer     left  . GERD (gastroesophageal reflux disease)   . Esophageal stricture   . External hemorrhoids   . Arthritis   . TIA (transient ischemic attack)     Past Surgical History  Procedure Date  . Abdominal hysterectomy   . Cystectomy in her 20's  . Cholecystectomy 2002  . Tonsillectomy   . Breast lumpectomy 2010    left    Social History [name]  reports that she has never smoked. She has never used smokeless tobacco. She reports that she does not drink alcohol or use illicit drugs.  family history includes Cervical cancer in her sister; Hypertension in her father; and Lung cancer in her brother.  Allergies  Allergen Reactions  . Ibandronate Sodium     REACTION:  joint aches  . Sulfonamide Derivatives        PHYSICAL EXAMINATION: Vital signs: BP 118/60  Pulse 49  Ht 5\' 5"  (1.651 m)  Wt 160 lb 3.2 oz (72.666 kg)  BMI 26.66 kg/m2  SpO2 96% General: Well-developed, well-nourished, no acute distress HEENT: Sclerae are anicteric, conjunctiva pink. Oral mucosa intact Lungs: Clear Heart: Regular Abdomen: soft, nontender, nondistended, no obvious ascites, no peritoneal signs, normal bowel sounds. No organomegaly. Extremities: No edema Psychiatric: alert and oriented x3. Cooperative     ASSESSMENT:  #1. Dysphagia. Not typical esophageal type. #2. GERD #3. History of peptic stricture status post dilation 2005 and 2009 #4. Multiple medical problems     PLAN:  #1. Barium swallow with tablet. The stricture found or delayed passage of tablet, I would set her up for upper endoscopy with esophageal dilation. However, if the esophagram unremarkable, consider modified barium swallow with speech pathology

## 2011-08-02 NOTE — Patient Instructions (Signed)
You have been scheduled for a Barium Esophogram with tablet at [facility] (1st floor of the hospital) on Friday 08/09/11 at 11:00. Please arrive 15 minutes prior to your appointment for registration. Make certain not to have anything to eat or drink 6 hours prior to your test. If you need to reschedule for any reason, please contact radiology at [phone] to do so.

## 2011-08-08 ENCOUNTER — Ambulatory Visit (INDEPENDENT_AMBULATORY_CARE_PROVIDER_SITE_OTHER): Payer: Medicare Other | Admitting: General Surgery

## 2011-08-08 ENCOUNTER — Encounter (INDEPENDENT_AMBULATORY_CARE_PROVIDER_SITE_OTHER): Payer: Self-pay | Admitting: General Surgery

## 2011-08-08 VITALS — BP 138/62 | HR 70 | Temp 96.8°F | Ht 65.0 in | Wt 158.6 lb

## 2011-08-08 DIAGNOSIS — Z9889 Other specified postprocedural states: Secondary | ICD-10-CM

## 2011-08-08 NOTE — Patient Instructions (Signed)
You are healing uneventfully from your recent left breast biopsy. The pathology report from the initial biopsy showed atypical ductal hyperplasia, but following the complete excision there was no evidence of any atypia or malignancy. I do not think that any further surgery needs to be done. Your at somewhat increased risk for breast cancer and we need to follow you annually. Please be sure to get mammograms in August of 2013. I will see you in September 2013. Keep your appointment with Dr. Donnie Coffin.

## 2011-08-08 NOTE — Progress Notes (Signed)
Subjective:     Patient ID: [name], female   DOB: 10-Feb-1926, 77 y.o.   MRN: [id number]  HPI This patient returns for a postop check. She recently underwent image guided biopsy of the left breast which revealed atypical ductal hyperplasia. She underwent excision of this area with needle localization and the final pathology report shows no evidence of any atypia or malignancy.  Significant history is that she underwent left partial mastectomy and sentinel biopsy on May 29, 2009 for invasive cancer, stage TIc., N0(i+), receptor positive, HER-2/neu negative. She had postop radiation therapy. She is taking antiestrogen therapy off and on but has had lots of difficulty taking his medications. She is followed by Dr. Pierce Crane and Dr. Rodrigo Ran.  She has no complaints about her wound healing Review of Systems     Objective:   Physical Exam Patient looks well and is in no distress.  Left breast reveals a recent scar laterally and an old scar superiorly. The tissues are soft. There is no fluid collection or infection or tenderness. There is no axillary adenopathy.    Assessment:     Atypical ductal hyperplasia left breast, recovering uneventfully following wide local excision. No indication for further surgical intervention at this time.  History invasive cancer left breast, 12:00 position, status post a partial mastectomy and sentinel the biopsy for T1c., N0(i+), receptor positive HER-2 negative cancer.  She is at increased risk for developing another cancer in the future.    Plan:     She is advised to keep all of her regularly scheduled with Dr. Pierce Crane, the next being in December 2012.  Next mammograms are due in August of 2013.  See me in September 2013.

## 2011-08-09 ENCOUNTER — Ambulatory Visit (HOSPITAL_COMMUNITY)
Admission: RE | Admit: 2011-08-09 | Discharge: 2011-08-09 | Disposition: A | Discharge status: Home / Self Care | Payer: Medicare Other | Source: Ambulatory Visit | Attending: Internal Medicine | Admitting: Internal Medicine

## 2011-08-09 ENCOUNTER — Telehealth: Payer: Self-pay

## 2011-08-09 DIAGNOSIS — R05 Cough: Secondary | ICD-10-CM | POA: Insufficient documentation

## 2011-08-09 DIAGNOSIS — K2289 Other specified disease of esophagus: Secondary | ICD-10-CM | POA: Insufficient documentation

## 2011-08-09 DIAGNOSIS — K228 Other specified diseases of esophagus: Secondary | ICD-10-CM | POA: Insufficient documentation

## 2011-08-09 DIAGNOSIS — K219 Gastro-esophageal reflux disease without esophagitis: Secondary | ICD-10-CM

## 2011-08-09 DIAGNOSIS — R634 Abnormal weight loss: Secondary | ICD-10-CM | POA: Insufficient documentation

## 2011-08-09 DIAGNOSIS — R1310 Dysphagia, unspecified: Secondary | ICD-10-CM | POA: Insufficient documentation

## 2011-08-09 DIAGNOSIS — R059 Cough, unspecified: Secondary | ICD-10-CM | POA: Insufficient documentation

## 2011-08-09 DIAGNOSIS — R1312 Dysphagia, oropharyngeal phase: Secondary | ICD-10-CM

## 2011-08-09 NOTE — Telephone Encounter (Signed)
Pt scheduled for modified barium swallow with speech pathology for 08/22/11 pt to check in with radiology on the 1st floor at [facility] @9 :45am, procedure scheduled for 10am. Pt aware of appt date and time.

## 2011-08-09 NOTE — Telephone Encounter (Signed)
Message copied by Michele Mcalpine on Fri Aug 09, 2011  2:08 PM ------      Message from: Hilarie Fredrickson      Created: Caleen Essex Aug 09, 2011 12:36 PM       Let patient know that the barium swallow is unremarkable.  Set her up for MODIFIED BARIUM SWALLOW WITH SPEECH PATHOLOGY to assess for oropharyngeal dysphagia (assesses coordination of swallowing)

## 2011-08-12 NOTE — Telephone Encounter (Signed)
Pt aware of appt date and time

## 2011-08-14 ENCOUNTER — Other Ambulatory Visit (HOSPITAL_COMMUNITY): Payer: Self-pay | Admitting: Internal Medicine

## 2011-08-22 ENCOUNTER — Telehealth: Payer: Self-pay

## 2011-08-22 ENCOUNTER — Ambulatory Visit (HOSPITAL_COMMUNITY)
Admission: RE | Admit: 2011-08-22 | Discharge: 2011-08-22 | Disposition: A | Discharge status: Home / Self Care | Payer: Medicare Other | Source: Ambulatory Visit | Attending: Internal Medicine | Admitting: Internal Medicine

## 2011-08-22 ENCOUNTER — Other Ambulatory Visit: Payer: Self-pay | Admitting: Dermatology

## 2011-08-22 DIAGNOSIS — R1310 Dysphagia, unspecified: Secondary | ICD-10-CM | POA: Insufficient documentation

## 2011-08-22 DIAGNOSIS — R1312 Dysphagia, oropharyngeal phase: Secondary | ICD-10-CM

## 2011-08-22 NOTE — Procedures (Addendum)
Modified Barium Swallow Procedure Note Patient Details  Name: [name] MRN: [id number] Date of Birth: 1926/04/18  Today's Date: 08/22/2011 Time: 1005 - 1110    Past Medical History:  Past Medical History  Diagnosis Date  . Hyperlipidemia   . Hypertension   . Breast cancer     left  . GERD (gastroesophageal reflux disease)   . Esophageal stricture   . External hemorrhoids   . Arthritis   . TIA (transient ischemic attack)   . Cough    Past Surgical History:  Past Surgical History  Procedure Date  . Abdominal hysterectomy   . Cystectomy in her 20's  . Cholecystectomy 2002  . Tonsillectomy   . Breast lumpectomy 2010    left      Recommendation/Prognosis  Clinical Impression Dysphagia Diagnosis: Mild oral phase dysphagia;Mild pharyngeal phase dysphagia Clinical impression: Pt appears with mild oropharyngeal dysphagia, suspected primarily due to xerostomia.  Pt with resultant severe pharyngeal stasis of cracker at vallecular space (0% entered esophagus with first swallow) with intact sensation.  Dry and liquid swallow fully cleared cracker.  Pt's dysphagia did not appear consistent as she was able to swallow pudding and barium pill without residuals or difficulties.  Suspect mild discoordination and xerostomia.  No aspiration or penetration was observed.  Provided pt with written tips to compensate for xerostomia and dysphagia.  Pt verbalized understanding to information provided.     Recommendations Recommended Consults: Other (Comment) (review medications with PharmD/MD re xerostomia side effects) Solid Consistency: Dysphagia 3 (Mechanical soft) (extra gravy and sauce) Liquid Consistency: Thin Medication Administration: Other (Comment) (as tolerated, chewable if able, with pudding if problematic) Compensations: Slow rate;Follow solids with liquid;Small sips/bites Postural Changes and/or Swallow Maneuvers: Upright 30-60 min after meal;Seated upright 90 degrees Oral  Care Recommendations: Oral care BID   Individuals Consulted Consulted and Agree with Results and Recommendations: Patient    General:  Other Pertinent Information: 77 year old referred by Dr Marina Goodell to evaluate for oropharyngeal dysphagia.  PMH + for hyperlipidemia, TIA, HTN, hemorrhoids external, esophageal stricture s/p dilation 2005, 2009, constipation, GERD and arthritis.  Pt  complains of dysphagia to solids with sensation of stasis in pharynx and occasional choking on liquids for "a long time" without significant worsening.  Pt also acknowledged problems swallowing pills at times requiring her to apply pressure to right side of neck and swallow water to clear.  Pt acknowledges that she now eats small amounts at a time,  as it "keeps food from coming up".  Xerostomia reported worsening in the last few months and pt compensates by drinking water or tea throughout the day.  Esophagram 08/09/2011 revealed mild presbyesophagus within normal limits for age.  CXR 06/16/11 no acute, ? pulmonary arterial HTN.  Weight loss recently reported by pt due to "bad taste in mouth" since surgery September 2012, which pt stated is improving. No pneumonias reported by pt.       Type of Study: Initial MBS Diet Prior to this Study: Regular;Thin liquids Behavior/Cognition: Alert Oral Cavity - Dentition: Adequate natural dentition Oral Motor / Sensory Function: Within functional limits Vision: Functional for self-feeding Patient Positioning: Upright in chair Baseline Vocal Quality: Normal Volitional Cough: Strong Volitional Swallow: Able to elicit Anatomy: Within functional limits Ice chips: Not tested  Oral Phase Oral Preparation/Oral Phase Oral Phase: WFL (trace amount of oral residual spill into pharynx s/p swallow) Pharyngeal Phase  Pharyngeal Phase Pharyngeal Phase: Impaired Pharyngeal - Solids Pharyngeal - Regular: Pharyngeal residue - valleculae;Compensatory  strategies attempted (Comment) (severe  residuals required liquid swallow to clear) Cervical Esophageal Phase  Cervical Esophageal Phase Cervical Esophageal Phase: Impaired Cervical Esophageal Phase - Comment Cervical Esophageal Comment: appearance of minimally delayed clearance of pudding barium at proximal esophagus, ? appearance of mild osteophytes mildly impinging into pharynx/proximal esophagus at C5-C6, C6-C7, C7-T1 that did not impair barium flow , radiologist not present to confirm findings     Chales Abrahams 08/22/2011, 11:50 AM

## 2011-08-22 NOTE — Telephone Encounter (Signed)
Pt aware.

## 2011-08-22 NOTE — Telephone Encounter (Signed)
Message copied by Michele Mcalpine on Thu Aug 22, 2011  1:55 PM ------      Message from: Hilarie Fredrickson      Created: Thu Aug 22, 2011  1:25 PM       Bonita Quin, please let the patient know that I have reviewed her modified barium swallow with speech pathology. She should follow the recommendations of the speech pathologist for mild oropharyngeal dysphagia. No further GI workup indicated at this time. Return to the care of her primary provider. GI followup as needed

## 2011-09-02 ENCOUNTER — Telehealth: Payer: Self-pay | Admitting: *Deleted

## 2011-09-02 NOTE — Telephone Encounter (Signed)
patient confirmed over the phone the new date and time of the appointment on 09-30-2011 at 9:30

## 2011-09-27 ENCOUNTER — Other Ambulatory Visit: Payer: Medicare Other | Admitting: Lab

## 2011-09-30 ENCOUNTER — Ambulatory Visit: Payer: Medicare Other | Admitting: Oncology

## 2011-10-04 ENCOUNTER — Ambulatory Visit: Payer: Medicare Other | Admitting: Oncology

## 2011-10-18 ENCOUNTER — Ambulatory Visit: Payer: Medicare Other | Admitting: Oncology

## 2011-10-18 ENCOUNTER — Other Ambulatory Visit: Payer: Medicare Other | Admitting: Lab

## 2011-11-06 ENCOUNTER — Other Ambulatory Visit: Payer: Self-pay | Admitting: Oncology

## 2011-11-08 ENCOUNTER — Ambulatory Visit (HOSPITAL_BASED_OUTPATIENT_CLINIC_OR_DEPARTMENT_OTHER): Payer: Medicare Other | Admitting: Oncology

## 2011-11-08 ENCOUNTER — Telehealth: Payer: Self-pay | Admitting: Oncology

## 2011-11-08 ENCOUNTER — Other Ambulatory Visit (HOSPITAL_BASED_OUTPATIENT_CLINIC_OR_DEPARTMENT_OTHER): Payer: Medicare Other | Admitting: Lab

## 2011-11-08 VITALS — BP 149/68 | HR 58 | Ht 65.0 in | Wt 159.9 lb

## 2011-11-08 DIAGNOSIS — C50919 Malignant neoplasm of unspecified site of unspecified female breast: Secondary | ICD-10-CM

## 2011-11-08 DIAGNOSIS — E559 Vitamin D deficiency, unspecified: Secondary | ICD-10-CM

## 2011-11-08 DIAGNOSIS — Z853 Personal history of malignant neoplasm of breast: Secondary | ICD-10-CM

## 2011-11-08 DIAGNOSIS — Z79811 Long term (current) use of aromatase inhibitors: Secondary | ICD-10-CM

## 2011-11-08 DIAGNOSIS — C50119 Malignant neoplasm of central portion of unspecified female breast: Secondary | ICD-10-CM

## 2011-11-08 LAB — CBC WITH DIFFERENTIAL/PLATELET
BASO%: 0.5 % (ref 0.0–2.0)
EOS%: 1.9 % (ref 0.0–7.0)
HCT: 39.1 % (ref 34.8–46.6)
LYMPH%: 25.6 % (ref 14.0–49.7)
MCH: 29.5 pg (ref 25.1–34.0)
MCHC: 34.3 g/dL (ref 31.5–36.0)
NEUT%: 62.3 % (ref 38.4–76.8)
Platelets: 235 10*3/uL (ref 145–400)

## 2011-11-08 NOTE — Telephone Encounter (Signed)
gve the pt her aug 2013 appt calendar along with the ct scan appt for feb

## 2011-11-09 LAB — COMPREHENSIVE METABOLIC PANEL
ALT: 16 U/L (ref 0–35)
AST: 21 U/L (ref 0–37)
Creatinine, Ser: 1.25 mg/dL — ABNORMAL HIGH (ref 0.50–1.10)
Total Bilirubin: 0.5 mg/dL (ref 0.3–1.2)

## 2011-11-09 LAB — VITAMIN D 25 HYDROXY (VIT D DEFICIENCY, FRACTURES): Vit D, 25-Hydroxy: 70 ng/mL (ref 30–89)

## 2011-11-10 NOTE — Progress Notes (Signed)
Hematology and Oncology Follow Up Visit  [name] [id number] 05-21-1926 78 y.o. 11/10/2011 7:16 PM PCP Dr [name]  Principle Diagnosis: 78 yo with hx of t1C N0, er/pr+ breast cancer 05/28/09, currently on aromasin  Interim History:  There have been no intercurrent illness, hospitalizations or medication changes. She has been doing well on aromasin. She has some reflux issues and as esophageal stricture issues. Last mammogram in august revealed abnormality in lt breast , subsequent biopsy revealed ADH. Medications: I have reviewed the patient's current medications.  Allergies:  Allergies  Allergen Reactions  . Ibandronate Sodium     REACTION: joint aches  . Sulfonamide Derivatives     Past Medical History, Surgical history, Social history, and Family History were reviewed and updated.  Review of Systems: Constitutional:  Negative for fever, chills, night sweats, anorexia, weight loss, pain. Cardiovascular: no chest pain or dyspnea on exertion Respiratory: no cough, shortness of breath, or wheezing Neurological: negative Dermatological: negative ENT: negative Skin Gastrointestinal: no abdominal pain, change in bowel habits, or black or bloody stools Genito-Urinary: no dysuria, trouble voiding, or hematuria Hematological and Lymphatic: negative Breast: negative for breast lumps Musculoskeletal: negative Remaining ROS negative.  Physical Exam: Blood pressure 149/68, pulse 58, height 5\' 5"  (1.651 m), weight 159 lb 14.4 oz (72.53 kg). ECOG: 0 General appearance: alert, cooperative and appears stated age Head: Normocephalic, without obvious abnormality, atraumatic Neck: no adenopathy, no carotid bruit, no JVD, supple, symmetrical, trachea midline and thyroid not enlarged, symmetric, no tenderness/mass/nodules Lymph nodes: Cervical, supraclavicular, and axillary nodes normal. Cardiac : regular rate and rhythm, no murmurs or gallops Pulmonary:clear to auscultation bilaterally  and normal percussion bilaterally Breasts: inspection negative, no nipple discharge or bleeding, no masses or nodularity palpable, both axillae are negative Lt breast did  Not reveal any abnormalities. Abdomen:soft, non-tender; bowel sounds normal; no masses,  no organomegaly Extremities negative Neuro: alert, oriented, normal speech, no focal findings or movement disorder noted  Lab Results: Lab Results  Component Value Date   WBC 6.4 11/08/2011   HGB 13.4 11/08/2011   HCT 39.1 11/08/2011   MCV 86.3 11/08/2011   PLT 235 11/08/2011     Chemistry      Component Value Date/Time   NA 140 11/08/2011 1022   K 3.1* 11/08/2011 1022   CL 99 11/08/2011 1022   CO2 31 11/08/2011 1022   BUN 23 11/08/2011 1022   CREATININE 1.25* 11/08/2011 1022      Component Value Date/Time   CALCIUM 10.0 11/08/2011 1022   ALKPHOS 73 11/08/2011 1022   AST 21 11/08/2011 1022   ALT 16 11/08/2011 1022   BILITOT 0.5 11/08/2011 1022      .pathology. Radiological Studies: chest X-ray n/a Mammogram As above Bone density n/a  Impression and Plan: [name] is doing well, I will see her in 6 months with appropriate imaging studies.  More than 50% of the visit was spent in patient-related counselling   Pierce Crane, MD 2/3/20137:16 PM

## 2011-11-13 ENCOUNTER — Ambulatory Visit (HOSPITAL_COMMUNITY)
Admission: RE | Admit: 2011-11-13 | Discharge: 2011-11-13 | Disposition: A | Discharge status: Home / Self Care | Payer: Medicare Other | Source: Ambulatory Visit | Attending: Oncology | Admitting: Oncology

## 2011-11-13 ENCOUNTER — Encounter (HOSPITAL_COMMUNITY): Payer: Self-pay

## 2011-11-13 ENCOUNTER — Other Ambulatory Visit: Payer: Self-pay | Admitting: Oncology

## 2011-11-13 DIAGNOSIS — R918 Other nonspecific abnormal finding of lung field: Secondary | ICD-10-CM | POA: Insufficient documentation

## 2011-11-13 DIAGNOSIS — R079 Chest pain, unspecified: Secondary | ICD-10-CM | POA: Insufficient documentation

## 2011-11-13 DIAGNOSIS — C50919 Malignant neoplasm of unspecified site of unspecified female breast: Secondary | ICD-10-CM

## 2011-11-13 DIAGNOSIS — I359 Nonrheumatic aortic valve disorder, unspecified: Secondary | ICD-10-CM | POA: Insufficient documentation

## 2011-11-13 DIAGNOSIS — I700 Atherosclerosis of aorta: Secondary | ICD-10-CM | POA: Insufficient documentation

## 2011-11-13 DIAGNOSIS — N289 Disorder of kidney and ureter, unspecified: Secondary | ICD-10-CM | POA: Insufficient documentation

## 2011-11-13 DIAGNOSIS — Z9089 Acquired absence of other organs: Secondary | ICD-10-CM | POA: Insufficient documentation

## 2011-11-13 DIAGNOSIS — E559 Vitamin D deficiency, unspecified: Secondary | ICD-10-CM

## 2011-11-13 DIAGNOSIS — I2510 Atherosclerotic heart disease of native coronary artery without angina pectoris: Secondary | ICD-10-CM | POA: Insufficient documentation

## 2011-11-13 DIAGNOSIS — Z79899 Other long term (current) drug therapy: Secondary | ICD-10-CM | POA: Insufficient documentation

## 2011-11-13 MED ORDER — IOHEXOL 300 MG/ML  SOLN
80.0000 mL | Freq: Once | INTRAMUSCULAR | Status: AC | PRN
  Administered 2011-11-13: 80 mL via INTRAVENOUS

## 2012-01-14 ENCOUNTER — Other Ambulatory Visit: Payer: Self-pay | Admitting: Internal Medicine

## 2012-01-16 ENCOUNTER — Encounter: Payer: Self-pay | Admitting: Internal Medicine

## 2012-01-16 ENCOUNTER — Ambulatory Visit (INDEPENDENT_AMBULATORY_CARE_PROVIDER_SITE_OTHER): Payer: Medicare Other | Admitting: Internal Medicine

## 2012-01-16 ENCOUNTER — Other Ambulatory Visit (INDEPENDENT_AMBULATORY_CARE_PROVIDER_SITE_OTHER): Payer: Medicare Other

## 2012-01-16 ENCOUNTER — Other Ambulatory Visit: Payer: Self-pay | Admitting: Internal Medicine

## 2012-01-16 VITALS — BP 120/58 | HR 60 | Ht 65.0 in | Wt 160.1 lb

## 2012-01-16 DIAGNOSIS — R932 Abnormal findings on diagnostic imaging of liver and biliary tract: Secondary | ICD-10-CM

## 2012-01-16 LAB — HEPATIC FUNCTION PANEL
AST: 170 U/L — ABNORMAL HIGH (ref 0–37)
Albumin: 3.8 g/dL (ref 3.5–5.2)
Alkaline Phosphatase: 110 U/L (ref 39–117)
Total Protein: 7 g/dL (ref 6.0–8.3)

## 2012-01-16 NOTE — Progress Notes (Signed)
HISTORY OF PRESENT ILLNESS:  [name] is a 78 y.o. female with the below listed medical history who presents today regarding abnormal liver function tests. She was last seen in October 2012 regarding atypical dysphagia. See that dictation. She does have a history of peptic stricture requiring esophageal dilation. Workup for the atypical dysphagia included a barium esophagram which was unremarkable. Subsequent modified barium swallow with speech pathology revealed mild oral pharyngeal dysphagia felt to be secondary to xerostomia. Speech pathology recommendations implemented. Patient has been doing better. She has also been seen previously for screening colonoscopy, which in 2005 was negative. The current history is that of acute right hip pain with radiation into the lower back about one month ago. She subsequently took a pain pill (which he had for left shoulder issues) which helped. She subsequently developed problems with nausea and dizziness. She saw Dr. Waynard Anderson. Review of laboratories from 01/02/2012 reveal elevated hepatic transaminases with an SGOT of 67, SGPT 132, alkaline phosphatase 149, and total bilirubin 0.6. Previously, liver function studies have been normal. She had been on medication for breast cancer which was stopped about 6 weeks ago. As well, her Crestor was stopped. She did undergo a CT scan of the abdomen and pelvis (reviewed) on 01/09/2012 ([facility]). This revealed mild intra-and extrahepatic biliary dilation without evident etiology. She is status post cholecystectomy. The patient has denied any problems with abdominal pain. No fevers or change in urine color. Currently, she is feeling well except for nonspecific fatigue.  REVIEW OF SYSTEMS:  All non-GI ROS negative except for back pain, visual change, fatigue, increased thirst, occasional cough  Past Medical History  Diagnosis Date  . Hyperlipidemia   . Hypertension   . GERD (gastroesophageal reflux disease)   .  Esophageal stricture   . External hemorrhoids   . Arthritis   . TIA (transient ischemic attack)   . Cough   . Breast cancer     left  . Anxiety     Past Surgical History  Procedure Date  . Abdominal hysterectomy   . Cystectomy in her 20's  . Cholecystectomy 2002  . Tonsillectomy   . Breast lumpectomy 2010    left  . Mastectomy, partial     left    Social History [name]  reports that she has never smoked. She has never used smokeless tobacco. She reports that she does not drink alcohol or use illicit drugs.  family history includes Cervical cancer in her sister; Hypertension in her father; and Lung cancer in her brother.  Allergies  Allergen Reactions  . Ibandronate Sodium     REACTION: joint aches  . Sulfonamide Derivatives        PHYSICAL EXAMINATION: Vital signs: BP 120/58  Pulse 60  Ht 5\' 5"  (1.651 m)  Wt 160 lb 2 oz (72.632 kg)  BMI 26.65 kg/m2  Constitutional: generally well-appearing, no acute distress Psychiatric: alert and oriented x3, cooperative Eyes: extraocular movements intact, anicteric, conjunctiva pink Mouth: oral pharynx moist, no lesions Neck: supple no lymphadenopathy Cardiovascular: heart regular rate and rhythm, no murmur Lungs: clear to auscultation bilaterally Abdomen: soft, nontender, nondistended, no obvious ascites, no peritoneal signs, normal bowel sounds, no organomegaly Extremities: no lower extremity edema bilaterally Skin: no lesions on visible extremities Neuro: No focal deficits. No asterixis.    ASSESSMENT:  #1. Mildly elevated liver tests. Etiology unclear. May be medication related. Given CT scan, rule out biliary obstructive process such as stone. #2. Abnormal biliary imaging as described #  3. Status post cholecystectomy #4. History of GERD complicated by peptic stricture. Asymptomatic at present #5. History of oral pharyngeal dysphagia. Workup as outlined. Doing better. #6. Colon cancer screening. Up to date.  Last exam 2005 #7. General medical problems. Under the care of Dr. Waynard Anderson   PLAN:  #1. Repeat liver tests today #2. Keep plans for MRCP on 01/22/2012 as already scheduled #3. Continue to hold Crestor until further notice #4. Further plans to be determined after the results from above available. If liver tests improved and MRCP negative, no further workup. If liver tests abnormal and MRCP negative, then continue to monitor LFTs and additional workup maybe required. Finally, if MRCP abnormal (i.e. Stone or other pathology), may need ERCP.

## 2012-01-16 NOTE — Patient Instructions (Signed)
Your physician has requested that you go to the basement for lab work before leaving today.  Please keep your appointment for an MRI on 01-22-12.  We will obtain the results for Dr. Marina Goodell to review

## 2012-01-17 ENCOUNTER — Other Ambulatory Visit (INDEPENDENT_AMBULATORY_CARE_PROVIDER_SITE_OTHER): Payer: Medicare Other

## 2012-01-17 ENCOUNTER — Telehealth: Payer: Self-pay | Admitting: Internal Medicine

## 2012-01-17 DIAGNOSIS — R7989 Other specified abnormal findings of blood chemistry: Secondary | ICD-10-CM

## 2012-01-17 NOTE — Telephone Encounter (Signed)
Spoke with pt and she states she has something hanging out of her rectum, she thought it might be a blood clot. States her husband looked at it and told her it looked like "meat." Discussed with pt that it could be a hemorrhoid. Let pt know that we did not have any appts today and that if she needed to be seen she would need to see her PCP or Urgent care. Pt verbalized understanding.

## 2012-01-20 ENCOUNTER — Encounter (INDEPENDENT_AMBULATORY_CARE_PROVIDER_SITE_OTHER): Payer: Self-pay | Admitting: Surgery

## 2012-01-20 ENCOUNTER — Ambulatory Visit (INDEPENDENT_AMBULATORY_CARE_PROVIDER_SITE_OTHER): Payer: Medicare Other | Admitting: Surgery

## 2012-01-20 VITALS — BP 132/74 | HR 64 | Temp 97.8°F | Resp 18 | Ht 65.0 in | Wt 161.0 lb

## 2012-01-20 DIAGNOSIS — K645 Perianal venous thrombosis: Secondary | ICD-10-CM

## 2012-01-20 HISTORY — DX: Perianal venous thrombosis: K64.5

## 2012-01-20 NOTE — Patient Instructions (Signed)
ANORECTAL SURGERY: POST OP INSTRUCTIONS  1. Take your usually prescribed home medications unless otherwise directed. 2. DIET: Follow a light bland diet the first 24 hours after arrival home, such as soup, liquids, crackers, etc.  Be sure to include lots of fluids daily.  Avoid fast food or heavy meals as your are more likely to get nauseated.  Eat a low fat the next few days after surgery.   3. PAIN CONTROL: a. Pain is best controlled by a usual combination of three different methods TOGETHER: i. Ice/Heat ii. Over the counter pain medication iii. Prescription pain medication b. Most patients will experience some swelling and discomfort in the anus/rectal area. and incisions.  Ice packs or heat (30-60 minutes up to 6 times a day) will help. Use ice for the first few days to help decrease swelling and bruising, then switch to heat such as warm towels, sitz baths, warm baths, etc to help relax tight/sore spots and speed recovery.  Some people prefer to use ice alone, heat alone, alternating between ice & heat.  Experiment to what works for you.  Swelling and bruising can take several weeks to resolve.   c. It is helpful to take an over-the-counter pain medication regularly for the first few weeks.  Choose one of the following that works best for you: i. Naproxen (Aleve, etc)  Two 220mg tabs twice a day ii. Ibuprofen (Advil, etc) Three 200mg tabs four times a day (every meal & bedtime) iii. Acetaminophen (Tylenol, etc) 500-650mg four times a day (every meal & bedtime) d. A  prescription for pain medication (such as oxycodone, hydrocodone, etc) should be given to you upon discharge.  Take your pain medication as prescribed.  i. If you are having problems/concerns with the prescription medicine (does not control pain, nausea, vomiting, rash, itching, etc), please call us [phone] to see if we need to switch you to a different pain medicine that will work better for you and/or control your side effect  better. ii. If you need a refill on your pain medication, please contact your pharmacy.  They will contact our office to request authorization. Prescriptions will not be filled after 5 pm or on week-ends. 4. KEEP YOUR BOWELS REGULAR a. The goal is one bowel movement a day b. Avoid getting constipated.  Between the surgery and the pain medications, it is common to experience some constipation.  Increasing fluid intake and taking a fiber supplement (such as Metamucil, Citrucel, FiberCon, MiraLax, etc) 1-2 times a day regularly will usually help prevent this problem from occurring.  A mild laxative (prune juice, Milk of Magnesia, MiraLax, etc) should be taken according to package directions if there are no bowel movements after 48 hours. c. Watch out for diarrhea.  If you have many loose bowel movements, simplify your diet to bland foods & liquids for a few days.  Stop any stool softeners and decrease your fiber supplement.  Switching to mild anti-diarrheal medications (Kayopectate, Pepto Bismol) can help.  If this worsens or does not improve, please call us.  5. Wound Care a. Remove your bandages the day after surgery.  Unless discharge instructions indicate otherwise, leave your bandage dry and in place overnight.  Remove the bandage during your first bowel movement.   b. Allow the wound packing to fall out over the next few days.  You can trim exposed gauze / ribbon as it falls out.  You do not need to repack the wound unless instructed otherwise.  Wear an   absorbent pad or soft cotton gauze in your underwear as needed to catch any drainage and help keep the area  c. Keep the area clean and dry.  Bathe / shower every day.  Keep the area clean by showering / bathing over the incision / wound.   It is okay to soak an open wound to help wash it.  Wet wipes or showers / gentle washing after bowel movements is often less traumatic than regular toilet paper. d. You may have some styrofoam-like soft packing in  the rectum which will come out with the first bowel movement.  e. You will often notice bleeding with bowel movements.  This should slow down by the end of the first week of surgery f. Expect some drainage.  This should slow down, too, by the end of the first week of surgery.  Wear an absorbent pad or soft cotton gauze in your underwear until the drainage stops. 6. ACTIVITIES as tolerated:   a. You may resume regular (light) daily activities beginning the next day-such as daily self-care, walking, climbing stairs-gradually increasing activities as tolerated.  If you can walk 30 minutes without difficulty, it is safe to try more intense activity such as jogging, treadmill, bicycling, low-impact aerobics, swimming, etc. b. Save the most intensive and strenuous activity for last such as sit-ups, heavy lifting, contact sports, etc  Refrain from any heavy lifting or straining until you are off narcotics for pain control.   c. DO NOT PUSH THROUGH PAIN.  Let pain be your guide: If it hurts to do something, don't do it.  Pain is your body warning you to avoid that activity for another week until the pain goes down. d. You may drive when you are no longer taking prescription pain medication, you can comfortably sit for long periods of time, and you can safely maneuver your car and apply brakes. e. You may have sexual intercourse when it is comfortable.  7. FOLLOW UP in our office a. Please call [facility] at [phone] to set up an appointment to see your surgeon in the office for a follow-up appointment approximately 2 weeks after your surgery. b. Make sure that you call for this appointment the day you arrive home to insure a convenient appointment time. 10. IF YOU HAVE DISABILITY OR FAMILY LEAVE FORMS, BRING THEM TO THE OFFICE FOR PROCESSING.  DO NOT GIVE THEM TO YOUR DOCTOR.        WHEN TO CALL US [phone]: 1. Poor pain control 2. Reactions / problems with new medications (rash/itching, nausea,  etc)  3. Fever over 101.5 F (38.5 C) 4. Inability to urinate 5. Nausea and/or vomiting 6. Worsening swelling or bruising 7. Continued bleeding from incision. 8. Increased pain, redness, or drainage from the incision  The clinic staff is available to answer your questions during regular business hours (8:30am-5pm).  Please don't hesitate to call and ask to speak to one of our nurses for clinical concerns.   A surgeon from [facility] is always on call at the hospitals   If you have a medical emergency, go to the nearest emergency room or call 911.    [facility] [address], [address], [place], [place]  [address] ? MAIN: [phone] ? TOLL FREE: [phone] ? FAX [phone] [email or web address]     HEMORRHOIDS   The rectum is the last few inches of your colon, and it naturally stretches to hold stool.  Hemorrhoidal piles are natural   clusters of blood vessels that help the rectum stretch to hold stool and allow bowel movements to eliminate feces.  Hemorrhoids are abnormally swollen blood vessels in the rectum.  Too much pressure in the rectum causes hemorrhoids by forcing blood to stretch and bulge the walls of the veins, sometimes even rupturing them.  Hemorrhoids can become like varicose veins you might see on a person's legs. When bulging hemorrhoidal veins are irritated, they can swell, burn, itch, become very painful, and bleed. Once the rectal veins have been stretched out and hemorrhoids created, they are difficult to get rid of completely and tend to recur with less straining than it took to cause them in the first place. Fortunately, good habits and simple medical treatment usually control hemorrhoids well, and surgery is only recommended in unusually severe cases. Some of the most frequent causes of hemorrhoids:    Constant sitting    Straining with bowel movements (from constipation or hard stools)    Diarrhea    Sitting  on the toilet for a long time    Severe coughing    Childbirth    Heavy Lifting  Types of Hemorrhoids:    Internal hemorrhoids usually don't hurt or itch; they are deep inside the rectum and usually have no sensation. However, internal hemorrhoids can bleed.  Such bleeding should not be ignored and mask blood from a dangerous source like colorectal cancer, so persistent rectal bleeding should be investigated with a colonoscopy.    External hemorrhoids cause most of the symptoms - pain, burning, and itching. Unirritated hemorrhoids can look like small skin tags coming out of the anus.     Thrombosed hemorrhoids can form when a hemorrhoid blood vessel bursts and causes the hemorrhoid to swell.  A purple blood clot can form in it and become an excruciatingly painful lump at the anus. Because of these unpleasant symptoms, immediate incision and drainage by a surgeon at an office visit can provide much relief of the pain.    PREVENTION Avoiding the causes listed in above will prevent most cases of hemorrhoids, but this advice is sometimes hard to follow:  How can you avoid sitting all day if you have a seated job? Also, we try to avoid coughing and diarrhea, but sometimes it's beyond your control.  Still, there are some practical hints to help:    If your main job activity is seated, always stand or walk during your breaks. Make it a point to stand and walk at least 5 minutes every hour and try to shift frequently in your chair to avoid direct rectal pressure.    Always exhale as you strain or lift. Don't hold your breath.    Treat coughing, diarrhea and constipation early since irritated hemorrhoids may soon follow.    Do not delay or try to prevent a bowel movement when the urge is present.   Exercise regularly (walking or jogging 60 minutes a day) to stimulate the bowels to move.   Avoid dry toilet paper when cleaning after bowel movements.  Moistened tissues such as baby wipes are less irritating.   Lightly pat the rectal area dry.  Using irrigating showers or bottle irrigation washing can more gently clean this sensitive area.   Keep the anal and genital area clean and  dry.  Talcum or baby powders can help   GET YOUR STOOLS SOFT.   This is the most important way to prevent irritated hemorrhoids.  Hard stools are like sandpaper to the anorectal   canal and will cause more problems.   The goal: ONE SOFT BOWEL MOVEMENT A DAY!  To have soft, regular bowel movements:    Drink at least 8 tall glasses of water a day.     AVOID CONSTIPATION    Take plenty of fiber.  Fiber is the undigested part of plant food that passes into the colon, acting s "natures broom" to encourage bowel motility and movement.  Fiber can absorb and hold large amounts of water. This results in a larger, bulkier stool, which is soft and easier to pass. Work gradually over several weeks up to 6 servings a day of fiber (25g a day even more if needed) in the form of: o Vegetables -- Root (potatoes, carrots, turnips), leafy green (lettuce, salad greens, celery, spinach), or cooked high residue (cabbage, broccoli, etc) o Fruit -- Fresh (unpeeled skin & pulp), Dried (prunes, apricots, cherries, etc ),  or stewed ( applesauce)  o Whole grain breads, pasta, etc (whole wheat)  o Bran cereals    Bulking Agents -- This type of water-retaining fiber generally is easily obtained each day by one of the following:  o Psyllium bran -- The psyllium plant is remarkable because its ground seeds can retain so much water. This product is available as Metamucil, Konsyl, Effersyllium, Per Diem Fiber, or the less expensive generic preparation in drug and health food stores. Although labeled a laxative, it really is not a laxative.  o Methylcellulose -- This is another fiber derived from wood which also retains water. It is available as Citrucel. o Polyethylene Glycol - and "artificial" fiber commonly called Miralax or Glycolax.  It is helpful for people  with gassy or bloated feelings with regular fiber o Flax Seed - a less gassy fiber than psyllium   No reading or other relaxing activity while on the toilet. If bowel movements take longer than 5 minutes, you are too constipated   Laxatives can be useful for a short period if constipation is severe o Osmotics (Milk of Magnesia, Fleets phosphosoda, Magnesium citrate, MiraLax, GoLytely) are safer than  o Stimulants (Senokot, Castor Oil, Dulcolax, Ex Lax)    o Do not take laxatives for more than 7days in a row.   Laxatives are not a good long-term solution as it can stress the intestine and colon and causes too much mineral and fluid losses.    If badly constipated, try a Bowel Retraining Program: o Do not use laxatives.  o Eat a diet high in roughage, such as bran cereals and leafy vegetables.  o Drink six (6) ounces of prune or apricot juice each morning.  o Eat two (2) large servings of stewed fruit each day.  o Take one (1) heaping dose of a bulking agent (ex. Metamucil, Citrucel, Miralax) twice a day.  o Use sugar-free sweetener when possible to avoid excessive calories.  o Eat a normal breakfast.  o Set aside 15 minutes after breakfast to sit on the toilet, but do not strain to have a bowel movement.  o If you do not have a bowel movement by the third day, use an enema and repeat the above steps.    AVOID DIARRHEA o Switch to liquids and simpler foods for a few days to avoid stressing your intestines further. o Avoid dairy products (especially milk & ice cream) for a short time.  The intestines often can lose the ability to digest lactose when stressed. o Avoid foods that cause gassiness or bloating.  Typical foods   include beans and other legumes, cabbage, broccoli, and dairy foods.  Every person has some sensitivity to other foods, so listen to our body and avoid those foods that trigger problems for you. o Adding fiber (Citrucel, Metamucil, psyllium, Miralax) gradually can help thicken  stools by absorbing excess fluid and retrain the intestines to act more normally.  Slowly increase the dose over a few weeks.  Too much fiber too soon can backfire and cause cramping & bloating. o Probiotics (such as active yogurt, Align, etc) may help repopulate the intestines and colon with normal bacteria and calm down a sensitive digestive tract.  Most studies show it to be of mild help, though, and such products can be costly. o Medicines:   Bismuth subsalicylate (ex. Kayopectate, Pepto Bismol) every 30 minutes for up to 6 doses can help control diarrhea.  Avoid if pregnant.   Loperamide (Immodium) can slow down diarrhea.  Start with two tablets (4mg total) first and then try one tablet every 6 hours.  Avoid if you are having fevers or severe pain.  If you are not better or start feeling worse, stop all medicines and call your doctor for advice o Call your doctor if you are getting worse or not better.  Sometimes further testing (cultures, endoscopy, X-ray studies, bloodwork, etc) may be needed to help diagnose and treat the cause of the diarrhea.   If these preventive measures fail, you must take action right away! Hemorrhoids are one condition that can be mild in the morning and become intolerable by nightfall.      

## 2012-01-20 NOTE — Progress Notes (Signed)
Subjective:     Patient ID: [name], female   DOB: 1926-06-05, 78 y.o.   MRN: [id number]  HPI  NATHIFA RITTHALER  1973/09/28 [id number]  Patient Care Team: Ezequiel Kayser, MD as PCP - General (Internal Medicine)  This patient is a 78 y.o.female who presents today for surgical evaluation at the request of Dr. Waynard Edwards.   Reason for visit: Thrombosed hemorrhoid  Patient is a pleasant elderly woman. She struggles with constipation. She started on MiraLax. She actually was running loose and stopped it. She noticed pain and swelling a few days ago. She noticed a hard lump. It is tender. She's had hemorrhoid problems in the past. She thinks she's had a thrombosed hemorrhoid drained in the past. It feels like that. No sick contacts or travel history.  Patient Active Problem List  Diagnoses  . HYPERLIPIDEMIA  . HYPERTENSION  . TRANSIENT ISCHEMIC ATTACK  . ESOPHAGEAL STRICTURE  . GERD  . CONSTIPATION  . ARTHRITIS  . Thrombosed external hemorrhoid, R posterior    Past Medical History  Diagnosis Date  . Hyperlipidemia   . Hypertension   . GERD (gastroesophageal reflux disease)   . Esophageal stricture   . External hemorrhoids   . Arthritis   . TIA (transient ischemic attack)   . Cough   . Breast cancer     left  . Anxiety     Past Surgical History  Procedure Date  . Abdominal hysterectomy   . Cystectomy in her 20's  . Cholecystectomy 2002  . Tonsillectomy   . Breast lumpectomy 2010    left  . Mastectomy, partial     left    History   Social History  . Marital Status: Married    Spouse Name: N/A    Number of Children: 1  . Years of Education: N/A   Occupational History  . retired    Social History Main Topics  . Smoking status: Never Smoker   . Smokeless tobacco: Never Used  . Alcohol Use: No  . Drug Use: No  . Sexually Active: Not on file   Other Topics Concern  . Not on file   Social History Narrative  . No narrative on file    Family History    Problem Relation Age of Onset  . Lung cancer Brother   . Cervical cancer Sister   . Hypertension Father     Current Outpatient Prescriptions  Medication Sig Dispense Refill  . ALPRAZolam (XANAX) 0.5 MG tablet Take 0.5 mg by mouth at bedtime as needed.        Marland Kitchen amitriptyline (ELAVIL) 50 MG tablet daily.      Marland Kitchen aspirin 81 MG tablet Take 81 mg by mouth daily.      . ATENOLOL PO Take 50 mg by mouth daily.       Marland Kitchen BIOTIN PO Take by mouth daily.        . calcium carbonate (OS-CAL) 600 MG TABS Take 600 mg by mouth daily.        . Cholecalciferol (VITAMIN D3) 2000 UNITS TABS Take by mouth daily.        . felodipine (PLENDIL) 10 MG 24 hr tablet daily.      . fish oil-omega-3 fatty acids 1000 MG capsule Take 2 g by mouth daily.        . furosemide (LASIX) 40 MG tablet Take 20 mg by mouth daily.       . irbesartan (AVAPRO) 300 MG tablet Take  300 mg by mouth daily.       Marland Kitchen levothyroxine (SYNTHROID, LEVOTHROID) 50 MCG tablet Ad lib.      Ailene Ards 3-6-9 Fatty Acids (TRIPLE OMEGA COMPLEX PO) Take by mouth daily.        . potassium chloride SA (K-DUR,KLOR-CON) 20 MEQ tablet Take 20 mEq by mouth daily.       . Rosuvastatin Calcium (CRESTOR PO) Take by mouth.          Allergies  Allergen Reactions  . Ibandronate Sodium     REACTION: joint aches  . Sulfonamide Derivatives     BP 132/74  Pulse 64  Temp(Src) 97.8 F (36.6 C) (Temporal)  Resp 18  Ht 5\' 5"  (1.651 m)  Wt 161 lb (73.029 kg)  BMI 26.79 kg/m2     Review of Systems  Constitutional: Negative for fever, chills and diaphoresis.  HENT: Negative for ear pain, sore throat and trouble swallowing.   Eyes: Negative for photophobia and visual disturbance.  Respiratory: Negative for cough and choking.   Cardiovascular: Negative for chest pain and palpitations.  Gastrointestinal: Negative for nausea, vomiting, abdominal pain, diarrhea, constipation, anal bleeding and rectal pain.  Genitourinary: Negative for dysuria, frequency and  difficulty urinating.  Musculoskeletal: Negative for myalgias and gait problem.  Skin: Negative for color change, pallor and rash.  Neurological: Negative for dizziness, speech difficulty, weakness and numbness.  Hematological: Negative for adenopathy.  Psychiatric/Behavioral: Negative for confusion and agitation. The patient is not nervous/anxious.        Objective:   Physical Exam  Constitutional: She is oriented to person, place, and time. She appears well-developed and well-nourished. No distress.  HENT:  Head: Normocephalic.  Mouth/Throat: Oropharynx is clear and moist. No oropharyngeal exudate.  Eyes: Conjunctivae and EOM are normal. Pupils are equal, round, and reactive to light. No scleral icterus.  Neck: Normal range of motion. No tracheal deviation present.  Cardiovascular: Normal rate and intact distal pulses.   Pulmonary/Chest: Effort normal. No respiratory distress. She exhibits no tenderness.  Abdominal: Soft. She exhibits no distension. There is no tenderness. Hernia confirmed negative in the right inguinal area and confirmed negative in the left inguinal area.       Incisions clean with normal healing ridges.  No hernias  Genitourinary: No vaginal discharge found.       Perianal skin clean with good hygiene.  No pruritis.  No pilonidal disease.  No fissure.  No abscess/fistula.    R posterior thrombosed hemorrhoid  Tolerates digital rectal exam.  Normal sphincter tone.   No rectal masses.  Other hemorrhoidal piles WNL   Musculoskeletal: Normal range of motion. She exhibits no tenderness.  Lymphadenopathy:       Right: No inguinal adenopathy present.       Left: No inguinal adenopathy present.  Neurological: She is alert and oriented to person, place, and time. No cranial nerve deficit. She exhibits normal muscle tone. Coordination normal.  Skin: Skin is warm and dry. No rash noted. She is not diaphoretic.  Psychiatric: She has a normal mood and affect. Her behavior  is normal.       Assessment:     Thrombosed hemorrhoid    Plan:     I lanced & drained it:  The anatomy & physiology of the anorectal region was discussed.  The pathophysiology of hemorrhoids and differential diagnosis was discussed.  Natural history progression  of worsening swelling with eventual resolution over weeks to months was discussed.   I  stressed the importance of a bowel regimen to have daily soft bowel movements to minimize progression of disease.     The patient's symptoms are not adequately controlled.  Therefore, I recommended incision to drain the thrombosed hemorrhoid..  I went over the technique, risks, benefits, and alternatives.   Questions were answered.  The patient expressed understanding & wished to proceed.  The patient was positioned in the lateral decubitus position.  I placed a field block of local anaesthetic.  Perianal & rectal examination was done.  I incised & decompressed the thrombosed hemorrhoid.  The patient tolerated the procedure well.    As long as bleeding and tenderness resolved, she can followup p.r.n. Otherwise if her symptoms worsen or do not improve, return to clinic.  Educational handouts further explaining the pathology, treatment options, and bowel regimen were given as well.

## 2012-01-22 ENCOUNTER — Ambulatory Visit
Admission: RE | Admit: 2012-01-22 | Discharge: 2012-01-22 | Disposition: A | Discharge status: Home / Self Care | Payer: Medicare Other | Source: Ambulatory Visit | Attending: Internal Medicine | Admitting: Internal Medicine

## 2012-01-22 MED ORDER — GADOBENATE DIMEGLUMINE 529 MG/ML IV SOLN
14.0000 mL | Freq: Once | INTRAVENOUS | Status: AC | PRN
  Administered 2012-01-22: 14 mL via INTRAVENOUS

## 2012-01-29 ENCOUNTER — Telehealth: Payer: Self-pay

## 2012-01-29 NOTE — Telephone Encounter (Signed)
Message copied by Chrystie Nose on Wed Jan 29, 2012  4:55 PM ------      Message from: Hilarie Fredrickson      Created: Wed Jan 29, 2012  4:44 PM      Regarding: MRCP results and plans       Please contact the patient and tell her that I have reviewed her MRCP. No significant abnormalities. I would like her to come in for additional blood work including repeat LFTs, ANA, AMA, anti-smooth muscle antibody, anti-kidney liver microsomal antibody, and acute hepatitis serologies. She should followup with me, in the office, in 4 weeks. Thanks. Please convert this to a phone note for documentation purposes.

## 2012-01-29 NOTE — Telephone Encounter (Signed)
Spoke with pt and she is aware. Appt made for OV.

## 2012-01-30 ENCOUNTER — Other Ambulatory Visit (INDEPENDENT_AMBULATORY_CARE_PROVIDER_SITE_OTHER): Payer: Medicare Other

## 2012-01-30 DIAGNOSIS — R7989 Other specified abnormal findings of blood chemistry: Secondary | ICD-10-CM

## 2012-01-30 LAB — HEPATIC FUNCTION PANEL
ALT: 29 U/L (ref 0–35)
AST: 32 U/L (ref 0–37)
Alkaline Phosphatase: 90 U/L (ref 39–117)
Bilirubin, Direct: 0.1 mg/dL (ref 0.0–0.3)
Total Bilirubin: 0.4 mg/dL (ref 0.3–1.2)

## 2012-01-31 LAB — ANTI-SMOOTH MUSCLE ANTIBODY, IGG: Smooth Muscle Ab: 33 U — ABNORMAL HIGH (ref <20)

## 2012-02-01 LAB — ANTI-MICROSOMAL ANTIBODY LIVER / KIDNEY: LKM1 Ab: <=20 U (ref <=20.0)

## 2012-02-03 LAB — HEPATITIS PANEL, ACUTE
Hep A IgM: NEGATIVE
Hep B C IgM: NEGATIVE

## 2012-02-03 LAB — MITOCHONDRIAL ANTIBODIES: Mitochondrial M2 Ab, IgG: 0.23 (ref <0.91)

## 2012-02-10 ENCOUNTER — Encounter (INDEPENDENT_AMBULATORY_CARE_PROVIDER_SITE_OTHER): Payer: Self-pay | Admitting: Surgery

## 2012-02-10 ENCOUNTER — Ambulatory Visit (INDEPENDENT_AMBULATORY_CARE_PROVIDER_SITE_OTHER): Payer: Medicare Other | Admitting: Surgery

## 2012-02-10 VITALS — BP 122/68 | HR 68 | Temp 97.5°F | Resp 14 | Ht 64.0 in | Wt 161.0 lb

## 2012-02-10 DIAGNOSIS — K645 Perianal venous thrombosis: Secondary | ICD-10-CM | POA: Insufficient documentation

## 2012-02-10 NOTE — Progress Notes (Signed)
Subjective:     Patient ID: [name], female   DOB: August 18, 1926, 78 y.o.   MRN: [id number]  HPI  [name]  Apr 05, 1926 [id number]  Patient Care Team: Ezequiel Kayser, MD as PCP - General (Internal Medicine)  This patient is a 78 y.o.female who presents today for surgical evaluation.   Reason for visit: Hemorrhoid pain. Probable thrombosed hemorrhoid.  The patient is a pleasant elderly female who struggles with irregular bowel habits. I drained a thrombosed hemorrhoid in the right posterior aspect last month. She's been tried to be more compliant with her MiraLax. Using prune juice as well. She noted she was running a little loose so she stopped everything. She became severely constipated. She had a hard bowel movement a few days ago. She had pain and swelling. It is not improved. She feels a hard lump. She's worried that she has another thrombosed hemorrhoid.  Patient Active Problem List  Diagnoses  . HYPERLIPIDEMIA  . HYPERTENSION  . TRANSIENT ISCHEMIC ATTACK  . ESOPHAGEAL STRICTURE  . GERD  . CONSTIPATION  . ARTHRITIS  . Hemorrhoids, internal, thrombosed, Left lateral    Past Medical History  Diagnosis Date  . Hyperlipidemia   . Hypertension   . GERD (gastroesophageal reflux disease)   . Esophageal stricture   . External hemorrhoids   . Arthritis   . TIA (transient ischemic attack)   . Cough   . Breast cancer     left  . Anxiety   . Thrombosed external hemorrhoid, R posterior 01/20/2012    Past Surgical History  Procedure Date  . Abdominal hysterectomy   . Cystectomy in her 20's  . Cholecystectomy 2002  . Tonsillectomy   . Breast lumpectomy 2010    left  . Mastectomy, partial     left    History   Social History  . Marital Status: Married    Spouse Name: N/A    Number of Children: 1  . Years of Education: N/A   Occupational History  . retired    Social History Main Topics  . Smoking status: Never Smoker   . Smokeless tobacco: Never Used    . Alcohol Use: No  . Drug Use: No  . Sexually Active: Not on file   Other Topics Concern  . Not on file   Social History Narrative  . No narrative on file    Family History  Problem Relation Age of Onset  . Lung cancer Brother   . Cervical cancer Sister   . Hypertension Father     Current Outpatient Prescriptions  Medication Sig Dispense Refill  . ALPRAZolam (XANAX) 0.5 MG tablet Take 0.5 mg by mouth at bedtime as needed.        Marland Kitchen amitriptyline (ELAVIL) 50 MG tablet daily.      Marland Kitchen aspirin 81 MG tablet Take 81 mg by mouth daily.      . ATENOLOL PO Take 50 mg by mouth daily.       Marland Kitchen BIOTIN PO Take by mouth daily.        . calcium carbonate (OS-CAL) 600 MG TABS Take 600 mg by mouth daily.        . Cholecalciferol (VITAMIN D3) 2000 UNITS TABS Take by mouth daily.        . felodipine (PLENDIL) 10 MG 24 hr tablet daily.      . fish oil-omega-3 fatty acids 1000 MG capsule Take 2 g by mouth daily.        Marland Kitchen  furosemide (LASIX) 40 MG tablet Take 20 mg by mouth daily.       Marland Kitchen levothyroxine (SYNTHROID, LEVOTHROID) 50 MCG tablet Ad lib.      Ailene Ards 3-6-9 Fatty Acids (TRIPLE OMEGA COMPLEX PO) Take by mouth daily.        . potassium chloride SA (K-DUR,KLOR-CON) 20 MEQ tablet Take 20 mEq by mouth daily.       . irbesartan (AVAPRO) 300 MG tablet Take 300 mg by mouth daily.       . Rosuvastatin Calcium (CRESTOR PO) Take by mouth.          Allergies  Allergen Reactions  . Ibandronate Sodium     REACTION: joint aches  . Sulfonamide Derivatives     BP 122/68  Pulse 68  Temp(Src) 97.5 F (36.4 C) (Temporal)  Resp 14  Ht 5\' 4"  (1.626 m)  Wt 161 lb (73.029 kg)  BMI 27.64 kg/m2     Review of Systems  Constitutional: Negative for fever, chills and diaphoresis.  HENT: Negative for ear pain, sore throat and trouble swallowing.   Eyes: Negative for photophobia and visual disturbance.  Respiratory: Negative for cough and choking.   Cardiovascular: Negative for chest pain and  palpitations.  Gastrointestinal: Positive for diarrhea, constipation and rectal pain. Negative for nausea, vomiting, abdominal pain and anal bleeding.  Genitourinary: Negative for dysuria, frequency and difficulty urinating.  Musculoskeletal: Negative for myalgias and gait problem.  Skin: Negative for color change, pallor and rash.  Neurological: Negative for dizziness, speech difficulty, weakness and numbness.  Hematological: Negative for adenopathy.  Psychiatric/Behavioral: Negative for confusion and agitation. The patient is not nervous/anxious.        Objective:   Physical Exam  Constitutional: She is oriented to person, place, and time. She appears well-developed and well-nourished. No distress.  HENT:  Head: Normocephalic.  Mouth/Throat: Oropharynx is clear and moist. No oropharyngeal exudate.  Eyes: Conjunctivae and EOM are normal. Pupils are equal, round, and reactive to light. No scleral icterus.  Neck: Normal range of motion. No tracheal deviation present.  Cardiovascular: Normal rate and intact distal pulses.   Pulmonary/Chest: Effort normal. No respiratory distress. She exhibits no tenderness.  Abdominal: Soft. She exhibits no distension. There is no tenderness. Hernia confirmed negative in the right inguinal area and confirmed negative in the left inguinal area.       Incisions clean with normal healing ridges.  No hernias  Genitourinary: No vaginal discharge found.       Perianal skin clean with good hygiene.  No pruritis.  No external skin tags / hemorrhoids of significance.  No pilonidal disease.  No fissure.  No abscess/fistula.    Tolerates digital and anoscopic rectal exam.  Low Normal sphincter tone.  No rectal masses.  Hemorrhoidal piles WNL in right side.  Left lat hem side edematous with 1.5cm nodule c/w thromb int prolapsing hem   Musculoskeletal: Normal range of motion. She exhibits no tenderness.  Lymphadenopathy:       Right: No inguinal adenopathy present.        Left: No inguinal adenopathy present.  Neurological: She is alert and oriented to person, place, and time. No cranial nerve deficit. She exhibits normal muscle tone. Coordination normal.  Skin: Skin is warm and dry. No rash noted. She is not diaphoretic.  Psychiatric: She has a normal mood and affect. Her behavior is normal.       Assessment:     Left lateral prolapsing hemorrhoid with prob thrombosis  in it    Plan:     The anatomy & physiology of the anorectal region was discussed.  The pathophysiology of hemorrhoids and differential diagnosis was discussed.  Natural history progression  of worsening swelling with eventual resolution over weeks to months was discussed.   I again stressed the importance of a bowel regimen to have daily soft bowel movements to minimize progression of disease.     The patient's symptoms are not adequately controlled.  Therefore, I recommended incision to drain & excise the thrombosed hemorrhoid complex.  I went over the technique, risks, benefits, and alternatives.   Questions were answered.  The patient expressed understanding & wished to proceed.  The patient was positioned in the lateral decubitus position.  I placed a field block of local anaesthetic.  Perianal & rectal examination was done.  I incised & decompressed the thrombosed hemorrhoid.  It had numerous loculated clots. I gradually debrided that out. I used sharp dissection to surgically remove most of the remaining hemorrhoidal complex. That helped decompress the area well. I closed the wound using a running 4-0 Vicryl stitch leaving a gap at the distal end to allow the area to drain. I held pressure for 5 minutes. Hemostasis was excellent. She tolerated it well.    Educational handouts further explaining the pathology, treatment options, and bowel regimen were given as well.

## 2012-02-10 NOTE — Patient Instructions (Signed)
ANORECTAL SURGERY: POST OP INSTRUCTIONS  1. Take your usually prescribed home medications unless otherwise directed. 2. DIET: Follow a light bland diet the first 24 hours after arrival home, such as soup, liquids, crackers, etc.  Be sure to include lots of fluids daily.  Avoid fast food or heavy meals as your are more likely to get nauseated.  Eat a low fat the next few days after surgery.   3. PAIN CONTROL: a. Pain is best controlled by a usual combination of three different methods TOGETHER: i. Ice/Heat ii. Over the counter pain medication iii. Prescription pain medication b. Most patients will experience some swelling and discomfort in the anus/rectal area. and incisions.  Ice packs or heat (30-60 minutes up to 6 times a day) will help. Use ice for the first few days to help decrease swelling and bruising, then switch to heat such as warm towels, sitz baths, warm baths, etc to help relax tight/sore spots and speed recovery.  Some people prefer to use ice alone, heat alone, alternating between ice & heat.  Experiment to what works for you.  Swelling and bruising can take several weeks to resolve.   c. It is helpful to take an over-the-counter pain medication regularly for the first few weeks.  Choose one of the following that works best for you: i. Naproxen (Aleve, etc)  Two 220mg tabs twice a day ii. Ibuprofen (Advil, etc) Three 200mg tabs four times a day (every meal & bedtime) iii. Acetaminophen (Tylenol, etc) 500-650mg four times a day (every meal & bedtime) d. A  prescription for pain medication (such as oxycodone, hydrocodone, etc) should be given to you upon discharge.  Take your pain medication as prescribed.  i. If you are having problems/concerns with the prescription medicine (does not control pain, nausea, vomiting, rash, itching, etc), please call us [phone] to see if we need to switch you to a different pain medicine that will work better for you and/or control your side effect  better. ii. If you need a refill on your pain medication, please contact your pharmacy.  They will contact our office to request authorization. Prescriptions will not be filled after 5 pm or on week-ends. 4. KEEP YOUR BOWELS REGULAR a. The goal is one bowel movement a day b. Avoid getting constipated.  Between the surgery and the pain medications, it is common to experience some constipation.  Increasing fluid intake and taking a fiber supplement (such as Metamucil, Citrucel, FiberCon, MiraLax, etc) 1-2 times a day regularly will usually help prevent this problem from occurring.  A mild laxative (prune juice, Milk of Magnesia, MiraLax, etc) should be taken according to package directions if there are no bowel movements after 48 hours. c. Watch out for diarrhea.  If you have many loose bowel movements, simplify your diet to bland foods & liquids for a few days.  Stop any stool softeners and decrease your fiber supplement.  Switching to mild anti-diarrheal medications (Kayopectate, Pepto Bismol) can help.  If this worsens or does not improve, please call us.  5. Wound Care a. Remove your bandages the day after surgery.  Unless discharge instructions indicate otherwise, leave your bandage dry and in place overnight.  Remove the bandage during your first bowel movement.   b. Allow the wound packing to fall out over the next few days.  You can trim exposed gauze / ribbon as it falls out.  You do not need to repack the wound unless instructed otherwise.  Wear an   absorbent pad or soft cotton gauze in your underwear as needed to catch any drainage and help keep the area  c. Keep the area clean and dry.  Bathe / shower every day.  Keep the area clean by showering / bathing over the incision / wound.   It is okay to soak an open wound to help wash it.  Wet wipes or showers / gentle washing after bowel movements is often less traumatic than regular toilet paper. d. You may have some styrofoam-like soft packing in  the rectum which will come out with the first bowel movement.  e. You will often notice bleeding with bowel movements.  This should slow down by the end of the first week of surgery f. Expect some drainage.  This should slow down, too, by the end of the first week of surgery.  Wear an absorbent pad or soft cotton gauze in your underwear until the drainage stops. 6. ACTIVITIES as tolerated:   a. You may resume regular (light) daily activities beginning the next day-such as daily self-care, walking, climbing stairs-gradually increasing activities as tolerated.  If you can walk 30 minutes without difficulty, it is safe to try more intense activity such as jogging, treadmill, bicycling, low-impact aerobics, swimming, etc. b. Save the most intensive and strenuous activity for last such as sit-ups, heavy lifting, contact sports, etc  Refrain from any heavy lifting or straining until you are off narcotics for pain control.   c. DO NOT PUSH THROUGH PAIN.  Let pain be your guide: If it hurts to do something, don't do it.  Pain is your body warning you to avoid that activity for another week until the pain goes down. d. You may drive when you are no longer taking prescription pain medication, you can comfortably sit for long periods of time, and you can safely maneuver your car and apply brakes. e. You may have sexual intercourse when it is comfortable.  7. FOLLOW UP in our office a. Please call [facility] at [phone] to set up an appointment to see your surgeon in the office for a follow-up appointment approximately 2 weeks after your surgery. b. Make sure that you call for this appointment the day you arrive home to insure a convenient appointment time. 10. IF YOU HAVE DISABILITY OR FAMILY LEAVE FORMS, BRING THEM TO THE OFFICE FOR PROCESSING.  DO NOT GIVE THEM TO YOUR DOCTOR.        WHEN TO CALL US [phone]: 1. Poor pain control 2. Reactions / problems with new medications (rash/itching, nausea,  etc)  3. Fever over 101.5 F (38.5 C) 4. Inability to urinate 5. Nausea and/or vomiting 6. Worsening swelling or bruising 7. Continued bleeding from incision. 8. Increased pain, redness, or drainage from the incision  The clinic staff is available to answer your questions during regular business hours (8:30am-5pm).  Please don't hesitate to call and ask to speak to one of our nurses for clinical concerns.   A surgeon from [facility] is always on call at the hospitals   If you have a medical emergency, go to the nearest emergency room or call 911.    [facility] [address], [address], [place], [place]  [address] ? MAIN: [phone] ? TOLL FREE: [phone] ? FAX [phone] [email or web address]     HEMORRHOIDS   The rectum is the last few inches of your colon, and it naturally stretches to hold stool.  Hemorrhoidal piles are natural   clusters of blood vessels that help the rectum stretch to hold stool and allow bowel movements to eliminate feces.  Hemorrhoids are abnormally swollen blood vessels in the rectum.  Too much pressure in the rectum causes hemorrhoids by forcing blood to stretch and bulge the walls of the veins, sometimes even rupturing them.  Hemorrhoids can become like varicose veins you might see on a person's legs. When bulging hemorrhoidal veins are irritated, they can swell, burn, itch, become very painful, and bleed. Once the rectal veins have been stretched out and hemorrhoids created, they are difficult to get rid of completely and tend to recur with less straining than it took to cause them in the first place. Fortunately, good habits and simple medical treatment usually control hemorrhoids well, and surgery is only recommended in unusually severe cases. Some of the most frequent causes of hemorrhoids:    Constant sitting    Straining with bowel movements (from constipation or hard stools)    Diarrhea    Sitting  on the toilet for a long time    Severe coughing    Childbirth    Heavy Lifting  Types of Hemorrhoids:    Internal hemorrhoids usually don't hurt or itch; they are deep inside the rectum and usually have no sensation. However, internal hemorrhoids can bleed.  Such bleeding should not be ignored and mask blood from a dangerous source like colorectal cancer, so persistent rectal bleeding should be investigated with a colonoscopy.    External hemorrhoids cause most of the symptoms - pain, burning, and itching. Unirritated hemorrhoids can look like small skin tags coming out of the anus.     Thrombosed hemorrhoids can form when a hemorrhoid blood vessel bursts and causes the hemorrhoid to swell.  A purple blood clot can form in it and become an excruciatingly painful lump at the anus. Because of these unpleasant symptoms, immediate incision and drainage by a surgeon at an office visit can provide much relief of the pain.    PREVENTION Avoiding the causes listed in above will prevent most cases of hemorrhoids, but this advice is sometimes hard to follow:  How can you avoid sitting all day if you have a seated job? Also, we try to avoid coughing and diarrhea, but sometimes it's beyond your control.  Still, there are some practical hints to help:    If your main job activity is seated, always stand or walk during your breaks. Make it a point to stand and walk at least 5 minutes every hour and try to shift frequently in your chair to avoid direct rectal pressure.    Always exhale as you strain or lift. Don't hold your breath.    Treat coughing, diarrhea and constipation early since irritated hemorrhoids may soon follow.    Do not delay or try to prevent a bowel movement when the urge is present.   Exercise regularly (walking or jogging 60 minutes a day) to stimulate the bowels to move.   Avoid dry toilet paper when cleaning after bowel movements.  Moistened tissues such as baby wipes are less irritating.   Lightly pat the rectal area dry.  Using irrigating showers or bottle irrigation washing can more gently clean this sensitive area.   Keep the anal and genital area clean and  dry.  Talcum or baby powders can help   GET YOUR STOOLS SOFT.   This is the most important way to prevent irritated hemorrhoids.  Hard stools are like sandpaper to the anorectal   canal and will cause more problems.   The goal: ONE SOFT BOWEL MOVEMENT A DAY!  To have soft, regular bowel movements:    Drink at least 8 tall glasses of water a day.     AVOID CONSTIPATION    Take plenty of fiber.  Fiber is the undigested part of plant food that passes into the colon, acting s "natures broom" to encourage bowel motility and movement.  Fiber can absorb and hold large amounts of water. This results in a larger, bulkier stool, which is soft and easier to pass. Work gradually over several weeks up to 6 servings a day of fiber (25g a day even more if needed) in the form of: o Vegetables -- Root (potatoes, carrots, turnips), leafy green (lettuce, salad greens, celery, spinach), or cooked high residue (cabbage, broccoli, etc) o Fruit -- Fresh (unpeeled skin & pulp), Dried (prunes, apricots, cherries, etc ),  or stewed ( applesauce)  o Whole grain breads, pasta, etc (whole wheat)  o Bran cereals    Bulking Agents -- This type of water-retaining fiber generally is easily obtained each day by one of the following:  o Psyllium bran -- The psyllium plant is remarkable because its ground seeds can retain so much water. This product is available as Metamucil, Konsyl, Effersyllium, Per Diem Fiber, or the less expensive generic preparation in drug and health food stores. Although labeled a laxative, it really is not a laxative.  o Methylcellulose -- This is another fiber derived from wood which also retains water. It is available as Citrucel. o Polyethylene Glycol - and "artificial" fiber commonly called Miralax or Glycolax.  It is helpful for people  with gassy or bloated feelings with regular fiber o Flax Seed - a less gassy fiber than psyllium   No reading or other relaxing activity while on the toilet. If bowel movements take longer than 5 minutes, you are too constipated   Laxatives can be useful for a short period if constipation is severe o Osmotics (Milk of Magnesia, Fleets phosphosoda, Magnesium citrate, MiraLax, GoLytely) are safer than  o Stimulants (Senokot, Castor Oil, Dulcolax, Ex Lax)    o Do not take laxatives for more than 7days in a row.   Laxatives are not a good long-term solution as it can stress the intestine and colon and causes too much mineral and fluid losses.    If badly constipated, try a Bowel Retraining Program: o Do not use laxatives.  o Eat a diet high in roughage, such as bran cereals and leafy vegetables.  o Drink six (6) ounces of prune or apricot juice each morning.  o Eat two (2) large servings of stewed fruit each day.  o Take one (1) heaping dose of a bulking agent (ex. Metamucil, Citrucel, Miralax) twice a day.  o Use sugar-free sweetener when possible to avoid excessive calories.  o Eat a normal breakfast.  o Set aside 15 minutes after breakfast to sit on the toilet, but do not strain to have a bowel movement.  o If you do not have a bowel movement by the third day, use an enema and repeat the above steps.    AVOID DIARRHEA o Switch to liquids and simpler foods for a few days to avoid stressing your intestines further. o Avoid dairy products (especially milk & ice cream) for a short time.  The intestines often can lose the ability to digest lactose when stressed. o Avoid foods that cause gassiness or bloating.  Typical foods   include beans and other legumes, cabbage, broccoli, and dairy foods.  Every person has some sensitivity to other foods, so listen to our body and avoid those foods that trigger problems for you. o Adding fiber (Citrucel, Metamucil, psyllium, Miralax) gradually can help thicken  stools by absorbing excess fluid and retrain the intestines to act more normally.  Slowly increase the dose over a few weeks.  Too much fiber too soon can backfire and cause cramping & bloating. o Probiotics (such as active yogurt, Align, etc) may help repopulate the intestines and colon with normal bacteria and calm down a sensitive digestive tract.  Most studies show it to be of mild help, though, and such products can be costly. o Medicines:   Bismuth subsalicylate (ex. Kayopectate, Pepto Bismol) every 30 minutes for up to 6 doses can help control diarrhea.  Avoid if pregnant.   Loperamide (Immodium) can slow down diarrhea.  Start with two tablets (4mg total) first and then try one tablet every 6 hours.  Avoid if you are having fevers or severe pain.  If you are not better or start feeling worse, stop all medicines and call your doctor for advice o Call your doctor if you are getting worse or not better.  Sometimes further testing (cultures, endoscopy, X-ray studies, bloodwork, etc) may be needed to help diagnose and treat the cause of the diarrhea.   If these preventive measures fail, you must take action right away! Hemorrhoids are one condition that can be mild in the morning and become intolerable by nightfall.      

## 2012-02-13 ENCOUNTER — Encounter (INDEPENDENT_AMBULATORY_CARE_PROVIDER_SITE_OTHER): Payer: Medicare Other | Admitting: General Surgery

## 2012-02-14 ENCOUNTER — Encounter (INDEPENDENT_AMBULATORY_CARE_PROVIDER_SITE_OTHER): Payer: Self-pay | Admitting: Surgery

## 2012-02-14 ENCOUNTER — Ambulatory Visit (INDEPENDENT_AMBULATORY_CARE_PROVIDER_SITE_OTHER): Payer: Medicare Other | Admitting: Surgery

## 2012-02-14 DIAGNOSIS — K649 Unspecified hemorrhoids: Secondary | ICD-10-CM

## 2012-02-14 MED ORDER — HYDROCODONE-ACETAMINOPHEN 5-500 MG PO TABS: 1.0000 | ORAL_TABLET | ORAL | Status: DC | PRN

## 2012-02-14 NOTE — Progress Notes (Signed)
This lady returns to the urgent office with pain.  Exam reveals decent evacuation of clot.  She has nothing for pain.  She may have an element of spasm.   Will order narcotic for pain and Diltiezem cream for anal spasm.

## 2012-02-24 ENCOUNTER — Ambulatory Visit (INDEPENDENT_AMBULATORY_CARE_PROVIDER_SITE_OTHER): Payer: Medicare Other | Admitting: Surgery

## 2012-02-24 ENCOUNTER — Encounter (INDEPENDENT_AMBULATORY_CARE_PROVIDER_SITE_OTHER): Payer: Self-pay | Admitting: Surgery

## 2012-02-24 VITALS — BP 132/62 | HR 60 | Temp 97.6°F | Resp 16 | Ht 66.0 in | Wt 159.8 lb

## 2012-02-24 DIAGNOSIS — K645 Perianal venous thrombosis: Secondary | ICD-10-CM

## 2012-02-24 NOTE — Progress Notes (Signed)
Subjective:     Patient ID: [name], female   DOB: [birth year], 78 y.o.   MRN: [id number]  HPI   [name]  January 14, 1926 [id number]  Patient Care Team: Ezequiel Kayser, MD as PCP - General (Internal Medicine)  This patient is a 78 y.o.female who presents today for surgical evaluation.   Reason for visit: f/u I&D thrombosed hemorrhoid.  Patient had a recurrent thrombosed hemorrhoid that I lanced and partially closed the anoderm a few weeks ago. She had issues with spasm and improve diltiazem cream.  She feels better now. She claims she's having regular bowel movements. No bleeding. No fevers or chills  Patient Active Problem List  Diagnoses  . HYPERLIPIDEMIA  . HYPERTENSION  . TRANSIENT ISCHEMIC ATTACK  . ESOPHAGEAL STRICTURE  . GERD  . CONSTIPATION  . ARTHRITIS  . Hemorrhoids, internal, thrombosed, Left lateral    Past Medical History  Diagnosis Date  . Hyperlipidemia   . Hypertension   . GERD (gastroesophageal reflux disease)   . Esophageal stricture   . External hemorrhoids   . Arthritis   . TIA (transient ischemic attack)   . Cough   . Breast cancer     left  . Anxiety   . Thrombosed external hemorrhoid, R posterior 01/20/2012    Past Surgical History  Procedure Date  . Abdominal hysterectomy   . Cystectomy in her 20's  . Cholecystectomy 2002  . Tonsillectomy   . Breast lumpectomy 2010    left  . Mastectomy, partial     left    History   Social History  . Marital Status: Married    Spouse Name: N/A    Number of Children: 1  . Years of Education: N/A   Occupational History  . retired    Social History Main Topics  . Smoking status: Never Smoker   . Smokeless tobacco: Never Used  . Alcohol Use: No  . Drug Use: No  . Sexually Active: Not on file   Other Topics Concern  . Not on file   Social History Narrative  . No narrative on file    Family History  Problem Relation Age of Onset  . Lung cancer Brother   . Cervical cancer  Sister   . Hypertension Father     Current Outpatient Prescriptions  Medication Sig Dispense Refill  . ALPRAZolam (XANAX) 0.5 MG tablet Take 0.5 mg by mouth at bedtime as needed.        Marland Kitchen amitriptyline (ELAVIL) 50 MG tablet daily.      Marland Kitchen aspirin 81 MG tablet Take 81 mg by mouth daily.      . ATENOLOL PO Take 50 mg by mouth daily.       Marland Kitchen BIOTIN PO Take by mouth daily.        . calcium carbonate (OS-CAL) 600 MG TABS Take 600 mg by mouth daily.        . Cholecalciferol (VITAMIN D3) 2000 UNITS TABS Take by mouth daily.        . felodipine (PLENDIL) 10 MG 24 hr tablet daily.      . fish oil-omega-3 fatty acids 1000 MG capsule Take 2 g by mouth daily.        . furosemide (LASIX) 40 MG tablet Take 20 mg by mouth daily.       Marland Kitchen HYDROcodone-acetaminophen (VICODIN) 5-500 MG per tablet Take 1 tablet by mouth every 4 (four) hours as needed for pain.  20 tablet  1  .  levothyroxine (SYNTHROID, LEVOTHROID) 50 MCG tablet Ad lib.      Ailene Ards 3-6-9 Fatty Acids (TRIPLE OMEGA COMPLEX PO) Take by mouth daily.        . potassium chloride SA (K-DUR,KLOR-CON) 20 MEQ tablet Take 20 mEq by mouth daily.       . irbesartan (AVAPRO) 300 MG tablet Take 300 mg by mouth daily.       . Rosuvastatin Calcium (CRESTOR PO) Take by mouth.          Allergies  Allergen Reactions  . Ibandronate Sodium     REACTION: joint aches  . Sulfonamide Derivatives     BP 132/62  Pulse 60  Temp(Src) 97.6 F (36.4 C) (Temporal)  Resp 16  Ht 5\' 6"  (1.676 m)  Wt 159 lb 12.8 oz (72.485 kg)  BMI 25.79 kg/m2     Review of Systems  Constitutional: Negative for fever, chills and diaphoresis.  HENT: Negative for ear pain, sore throat and trouble swallowing.   Eyes: Negative for photophobia and visual disturbance.  Respiratory: Negative for cough and choking.   Cardiovascular: Negative for chest pain and palpitations.  Gastrointestinal: Positive for diarrhea, constipation and rectal pain. Negative for nausea, vomiting,  abdominal pain and anal bleeding.  Genitourinary: Negative for dysuria, frequency and difficulty urinating.  Musculoskeletal: Negative for myalgias and gait problem.  Skin: Negative for color change, pallor and rash.  Neurological: Negative for dizziness, speech difficulty, weakness and numbness.  Hematological: Negative for adenopathy.  Psychiatric/Behavioral: Negative for confusion and agitation. The patient is not nervous/anxious.        Objective:   Physical Exam  Constitutional: She is oriented to person, place, and time. She appears well-developed and well-nourished. No distress.  HENT:  Head: Normocephalic.  Mouth/Throat: Oropharynx is clear and moist. No oropharyngeal exudate.  Eyes: Conjunctivae and EOM are normal. Pupils are equal, round, and reactive to light. No scleral icterus.  Neck: Normal range of motion. No tracheal deviation present.  Cardiovascular: Normal rate and intact distal pulses.   Pulmonary/Chest: Effort normal. No respiratory distress. She exhibits no tenderness.  Abdominal: Soft. She exhibits no distension. There is no tenderness. Hernia confirmed negative in the right inguinal area and confirmed negative in the left inguinal area.       Incisions clean with normal healing ridges.  No hernias  Genitourinary: No vaginal discharge found.       Perianal skin clean with good hygiene.  No pruritis.  No external skin tags / hemorrhoids of significance.  No pilonidal disease.  No fissure.  No abscess/fistula.    Normal sphincter tone.  No rectal masses.  Left lat hem side minimal edema  Musculoskeletal: Normal range of motion. She exhibits no tenderness.  Lymphadenopathy:       Right: No inguinal adenopathy present.       Left: No inguinal adenopathy present.  Neurological: She is alert and oriented to person, place, and time. No cranial nerve deficit. She exhibits normal muscle tone. Coordination normal.  Skin: Skin is warm and dry. No rash noted. She is not  diaphoretic.  Psychiatric: She has a normal mood and affect. Her behavior is normal.       Assessment:     Left lateral prolapsing hemorrhoid with prob thrombosis in it s/p redo lancing, anal spasm improved.  Better with better bowel regimen    Plan:    Increase activity as tolerated.  Do not push through pain.  Advanced on diet as tolerated. Bowel regimen  to avoid problems.  Return to clinic p.r.n. The patient expressed understanding and appreciation

## 2012-02-24 NOTE — Patient Instructions (Signed)

## 2012-02-26 ENCOUNTER — Ambulatory Visit: Payer: Medicare Other | Admitting: Internal Medicine

## 2012-04-15 ENCOUNTER — Other Ambulatory Visit (INDEPENDENT_AMBULATORY_CARE_PROVIDER_SITE_OTHER): Payer: Self-pay | Admitting: General Surgery

## 2012-04-15 DIAGNOSIS — Z853 Personal history of malignant neoplasm of breast: Secondary | ICD-10-CM

## 2012-05-07 ENCOUNTER — Other Ambulatory Visit: Payer: Medicare Other | Admitting: Lab

## 2012-05-07 ENCOUNTER — Ambulatory Visit: Payer: Medicare Other | Admitting: Oncology

## 2012-05-11 ENCOUNTER — Encounter: Payer: Self-pay | Admitting: Internal Medicine

## 2012-05-21 ENCOUNTER — Encounter: Payer: Self-pay | Admitting: Internal Medicine

## 2012-05-21 ENCOUNTER — Ambulatory Visit (INDEPENDENT_AMBULATORY_CARE_PROVIDER_SITE_OTHER): Payer: Medicare Other | Admitting: Internal Medicine

## 2012-05-21 ENCOUNTER — Other Ambulatory Visit (INDEPENDENT_AMBULATORY_CARE_PROVIDER_SITE_OTHER): Payer: Medicare Other

## 2012-05-21 VITALS — BP 128/60 | HR 58 | Ht 65.0 in | Wt 154.0 lb

## 2012-05-21 DIAGNOSIS — R1310 Dysphagia, unspecified: Secondary | ICD-10-CM

## 2012-05-21 DIAGNOSIS — R1011 Right upper quadrant pain: Secondary | ICD-10-CM

## 2012-05-21 DIAGNOSIS — R1013 Epigastric pain: Secondary | ICD-10-CM

## 2012-05-21 DIAGNOSIS — M7918 Myalgia, other site: Secondary | ICD-10-CM

## 2012-05-21 DIAGNOSIS — IMO0001 Reserved for inherently not codable concepts without codable children: Secondary | ICD-10-CM

## 2012-05-21 DIAGNOSIS — R932 Abnormal findings on diagnostic imaging of liver and biliary tract: Secondary | ICD-10-CM

## 2012-05-21 LAB — HEPATIC FUNCTION PANEL
AST: 27 U/L (ref 0–37)
Alkaline Phosphatase: 64 U/L (ref 39–117)
Bilirubin, Direct: 0.1 mg/dL (ref 0.0–0.3)
Total Bilirubin: 0.6 mg/dL (ref 0.3–1.2)

## 2012-05-21 NOTE — Progress Notes (Signed)
HISTORY OF PRESENT ILLNESS:  [name] is a 78 y.o. female with hypertension, hyperlipidemia, GERD, arthritis, and breast cancer. She is also status post cholecystectomy, hysterectomy, tonsillectomy, and partial left mastectomy. The patient was evaluated in July of 2009 for dysphagia. Upper endoscopy was normal with no obvious stricture. She was empirically dilated with a 54 [place] Maloney dilator. She feels that this was helpful. She was subsequently seen in October 2012 regarding atypical dysphagia. A barium esophagram at that time was unremarkable. She did have modified barium swallow with speech pathology that revealed mild oropharyngeal dysphagia secondary to xerostomia. Screening colonoscopy in 2005 was negative. She was last seen in the office April 2013 regarding mildly elevated liver tests, abnormal biliary imaging with mild ductal dilation. Repeat liver tests that they were entirely normal. Previously scheduled MRCP revealed mild ductal dilation without other abnormality. Possibly physiologic postcholecystectomy in an octogenarian. She tells me that she was doing well until late May when she fell and broke her left arm. About a month ago she began to notice tenderness in the right upper quadrant in the area of the ribs. The discomfort is constant and sore to touch. It causes difficulty sleeping. She feels it is exacerbated by meals. Review of outside blood work from 05/04/2012 shows normal comprehensive metabolic panel and CBC. She mentioned the right-sided discomfort to Dr. Waynard Edwards during her office visit on August 5. This referral made. Since that time, the patient reports a 2 to three-week history of postprandial chest and epigastric discomfort. No nausea, vomiting, or melena. She does report weight loss. Her weight in April was 160. Today is 154 pounds. She does have intermittent problems with GERD symptoms for which she takes Zegerid infrequently. About twice per month.  REVIEW OF  SYSTEMS:  All non-GI ROS negative except for allergies, cough, insomnia  Past Medical History  Diagnosis Date  . Hyperlipidemia   . Hypertension   . GERD (gastroesophageal reflux disease)   . Esophageal stricture   . External hemorrhoids   . Arthritis   . TIA (transient ischemic attack)   . Cough   . Breast cancer     left  . Anxiety   . Thrombosed external hemorrhoid, R posterior 01/20/2012    Past Surgical History  Procedure Date  . Abdominal hysterectomy   . Cystectomy in her 20's  . Cholecystectomy 2002  . Tonsillectomy   . Breast lumpectomy 2010    left  . Mastectomy, partial     left    Social History [name]  reports that she has never smoked. She has never used smokeless tobacco. She reports that she does not drink alcohol or use illicit drugs.  family history includes Cervical cancer in her sister; Hypertension in her father; and Lung cancer in her brother.  There is no history of Colon cancer.  Allergies  Allergen Reactions  . Ibandronate Sodium     REACTION: joint aches  . Sulfonamide Derivatives        PHYSICAL EXAMINATION: Vital signs: BP 128/60  Pulse 58  Ht 5\' 5"  (1.651 m)  Wt 154 lb (69.854 kg)  BMI 25.63 kg/m2  Constitutional: generally well-appearing, no acute distress Psychiatric: alert and oriented x3, cooperative Eyes: extraocular movements intact, anicteric, conjunctiva pink Mouth: oral pharynx moist, no lesions Neck: supple no lymphadenopathy Cardiovascular: heart regular rate and rhythm, no murmur Lungs: clear to auscultation bilaterally Abdomen: soft, nontender, nondistended, no obvious ascites, no peritoneal signs, normal bowel sounds, no organomegaly. Point tenderness over  the right lower rib cage. This reproduces her pain Extremities: no lower extremity edema bilaterally Skin: no lesions on visible extremities Neuro: No focal deficits. No asterixis.     ASSESSMENT:  #1. Right-sided pain. Almost certainly  musculoskeletal. This may need further evaluation, particularly given her history of breast cancer as well as recent traumatic fall. I will leave further workup to Dr. Waynard Edwards. In the interim, symptomatic treatment with anti-inflammatories or analgesics such as Tylenol #2. GERD. Some breakthrough symptoms. Using Zegerid infrequently #3. Odynophagia/dysphagia to 3 weeks' duration and associated epigastric discomfort as described. Rule out acid peptic disorder #4. History of elevated LFTs. Resolved. History of mild biliary dilation without obstructive lesion on imaging studies   PLAN:  #1. Given the new problems with discomfort over the past few weeks, would like to check her LFTs one more time just to make sure that they have remained normal. If so, no further biliary workup at this time #2. Advised to take Zegerid once daily by cancer symptoms or acid peptic in nature #3. Schedule EGD to evaluate GI type pain and odynophagia/dysphagia.The nature of the procedure, as well as the risks, benefits, and alternatives were carefully and thoroughly reviewed with the patient. Ample time for discussion and questions allowed. The patient understood, was satisfied, and agreed to proceed.  #4. Return to Dr. Waynard Edwards regarding probable rib pain as noted above  ADDENDUM: Liver tests have returned normal. She will continue Zegerid and plans for endoscopy. Return to Dr. Waynard Edwards for musculoskeletal, probably rib pain.

## 2012-05-21 NOTE — Patient Instructions (Addendum)
Your physician has requested that you go to the basement for the following lab work before leaving today:  Liver functions   You have been scheduled for an endoscopy with propofol. Please follow written instructions given to you at your visit today. If you use inhalers (even only as needed), please bring them with you on the day of your procedure.   Continue taking your Zegrid as instructed

## 2012-05-22 ENCOUNTER — Other Ambulatory Visit: Payer: Self-pay | Admitting: Orthopedic Surgery

## 2012-05-22 DIAGNOSIS — R0781 Pleurodynia: Secondary | ICD-10-CM

## 2012-05-26 ENCOUNTER — Ambulatory Visit
Admission: RE | Admit: 2012-05-26 | Discharge: 2012-05-26 | Disposition: A | Discharge status: Home / Self Care | Payer: Medicare Other | Source: Ambulatory Visit | Attending: Orthopedic Surgery | Admitting: Orthopedic Surgery

## 2012-05-26 DIAGNOSIS — R0781 Pleurodynia: Secondary | ICD-10-CM

## 2012-06-03 ENCOUNTER — Ambulatory Visit
Admission: RE | Admit: 2012-06-03 | Discharge: 2012-06-03 | Disposition: A | Discharge status: Home / Self Care | Payer: Medicare Other | Source: Ambulatory Visit | Attending: General Surgery | Admitting: General Surgery

## 2012-06-03 DIAGNOSIS — Z853 Personal history of malignant neoplasm of breast: Secondary | ICD-10-CM

## 2012-06-09 ENCOUNTER — Ambulatory Visit (AMBULATORY_SURGERY_CENTER): Payer: Medicare Other | Admitting: Internal Medicine

## 2012-06-09 ENCOUNTER — Encounter: Payer: Self-pay | Admitting: Internal Medicine

## 2012-06-09 VITALS — BP 171/72 | HR 44 | Temp 95.9°F | Resp 18 | Ht 65.0 in | Wt 154.0 lb

## 2012-06-09 DIAGNOSIS — R1011 Right upper quadrant pain: Secondary | ICD-10-CM

## 2012-06-09 DIAGNOSIS — K219 Gastro-esophageal reflux disease without esophagitis: Secondary | ICD-10-CM

## 2012-06-09 DIAGNOSIS — R1013 Epigastric pain: Secondary | ICD-10-CM

## 2012-06-09 DIAGNOSIS — R1310 Dysphagia, unspecified: Secondary | ICD-10-CM

## 2012-06-09 DIAGNOSIS — R9389 Abnormal findings on diagnostic imaging of other specified body structures: Secondary | ICD-10-CM

## 2012-06-09 MED ORDER — SODIUM CHLORIDE 0.9 % IV SOLN: 500.0000 mL | INTRAVENOUS | Status: DC

## 2012-06-09 NOTE — Progress Notes (Signed)
Pt allergic to several cancer medicines; unsure of names

## 2012-06-09 NOTE — Patient Instructions (Addendum)
YOU HAD AN ENDOSCOPIC PROCEDURE TODAY AT THE [facility]: Refer to the procedure report that was given to you for any specific questions about what was found during the examination.  If the procedure report does not answer your questions, please call your gastroenterologist to clarify.  If you requested that your care partner not be given the details Some feelings of bloating in the abdomen. Passage of more gas than usual.     Drink plenty of fluids but you should avoid alcoholic beverages for 24 hours.  ACTIVITY: Your care partner should take you home directly after the procedure.  You should plan to take it easy, moving slowly for the rest of the day.  You can resume normal activity the day after the procedure however you should NOT DRIVE or use heavy machinery for 24 hours (because of the sedation medicines used during the test).    SYMPTOMS TO REPORT IMMEDIATELY: A gastroenterologist can be reached at any hour.  During normal business hours, 8:30 AM to 5:00 PM Monday through Friday, call [phone].  After hours and on weekends, please call the GI answering service at [phone] who will take a message and have the physician on call contact you.   Following upper endoscopy (EGD)  Vomiting of blood or coffee ground material  New chest pain or pain under the shoulder blades  Painful or persistently difficult swallowing  New shortness of breath  Fever of 100F or higher  Black, tarry-looking stools  FOLLOW UP: If any biopsies were taken you will be contacted by phone or by letter within the next 1-3 weeks.  Call your gastroenterologist if you have not heard about the biopsies in 3 weeks.  Our staff will call the home number listed on your records the next business day following your procedure to check on you and address any questions or concerns that you may have at that time regarding the information given to you following your procedure. This is a courtesy call and so if  there is no answer at the home number and we have not heard from you through the emergency physician on call, we will assume that you have returned to your regular daily activities without incident.  SIGNATURES/CONFIDENTIALITY: You and/or your care partner have signed paperwork which will be entered into your electronic medical record.  These signatures attest to the fact that that the information above on your After Visit Summary has been reviewed and is understood.  Full responsibility of the confidentiality of this discharge information lies with you and/or your care-partner.

## 2012-06-09 NOTE — Progress Notes (Signed)
Patient did not have preoperative order for IV antibiotic SSI prophylaxis. (G8918)  Patient did not experience any of the following events: a burn prior to discharge; a fall within the facility; wrong site/side/patient/procedure/implant event; or a hospital transfer or hospital admission upon discharge from the facility. (G8907)  

## 2012-06-09 NOTE — Op Note (Signed)
[facility] [address] [place], [address]   ENDOSCOPY PROCEDURE REPORT  PATIENT: [name], [name]  MR#: [id number] BIRTHDATE: [birth year] , 85  yrs. old GENDER: Female ENDOSCOPIST: Roxy Cedar, MD REFERRED BY:  Office PROCEDURE DATE:  06/09/2012 PROCEDURE:  EGD, diagnostic ASA CLASS:     Class II INDICATIONS:  epigastric pain, wt loss.  Much better on daily PPI MEDICATIONS: propofol (Diprivan) 90mg  IV   ; MASC, CRNA TOPICAL ANESTHETIC: Cetacaine Spray  DESCRIPTION OF PROCEDURE: After the risks benefits and alternatives of the procedure were thoroughly explained, informed consent was obtained.  The LB-GIF Q180 [id number] endoscope was introduced through the mouth and advanced to the second portion of the duodenum. Without limitations.  The instrument was slowly withdrawn as the mucosa was fully examined.    The upper, middle and distal third of the esophagus were carefully inspected and no abnormalities were noted.  The z-line was well seen at the GEJ.  The endoscope was pushed into the fundus which was normal including a retroflexed view.  The antrum, gastric body, first and second part of the duodenum were unremarkable. Retroflexed views revealed no abnormalities.     The scope was then withdrawn from the patient and the procedure completed.  COMPLICATIONS: There were no complications.  ENDOSCOPIC IMPRESSION: 1.   Normal EGD 2.    GERD  RECOMMENDATIONS: 1. continue PPI 2. GI follow up as needed  e]  eSigned:  Roxy Cedar, MD 06/09/2012 4:10 PM   NF:AOZH Perini, MD and The Patient

## 2012-06-10 ENCOUNTER — Telehealth: Payer: Self-pay | Admitting: *Deleted

## 2012-06-10 NOTE — Telephone Encounter (Signed)
  Follow up Call-  Call back number 06/09/2012  Post procedure Call Back phone  # [phone]  Permission to leave phone message Yes     Patient questions:  Do you have a fever, pain , or abdominal swelling? no Pain Score  0 *  Have you tolerated food without any problems? yes  Have you been able to return to your normal activities? yes  Do you have any questions about your discharge instructions: Diet   no Medications  no Follow up visit  no  Do you have questions or concerns about your Care? no  Actions: * If pain score is 4 or above: No action needed, pain <4. Spoke with pts husband who states she is fine with no problems at all. ewm

## 2012-06-16 ENCOUNTER — Encounter (INDEPENDENT_AMBULATORY_CARE_PROVIDER_SITE_OTHER): Payer: Self-pay | Admitting: General Surgery

## 2012-06-16 ENCOUNTER — Ambulatory Visit (INDEPENDENT_AMBULATORY_CARE_PROVIDER_SITE_OTHER): Payer: Medicare Other | Admitting: General Surgery

## 2012-06-16 VITALS — BP 148/60 | HR 64 | Temp 98.4°F | Resp 16 | Ht 66.0 in | Wt 156.0 lb

## 2012-06-16 DIAGNOSIS — C50419 Malignant neoplasm of upper-outer quadrant of unspecified female breast: Secondary | ICD-10-CM

## 2012-06-16 NOTE — Progress Notes (Signed)
Patient ID: [name], female   DOB: August 02, 1926, 78 y.o.   MRN: [id number]  Chief Complaint  Patient presents with  . Pre-op Exam    pre op breast surgery    HPI [name] is a 78 y.o. female.  She returned for long-term followup regarding her left breast cancer.  On 05/29/2009 this patient underwent left partial mastectomy and sentinel node biopsy for invasive cancer at the 12:30 position of the left breast. Final pathology showed T1c., N0(i+),   ER+, HER-2-negative cancer. She underwent adjuvant radiation therapy.  She has tried 3 different antiestrogen medications and is intolerant of all 3. In Jan.m, 2013 she had to stop the antiestrogens due to arthritic complaints and she feels much better now. She is followed by Dr. Rodrigo Ran and Dr. Pierce Crane.  Also she underwent a left breast biopsy in October 2012 which showed atypical ductal hyperplasia  Mammograms 06/03/2012 look good, no focal abnormality, category 2.  She has no complaints about her breast. Her health is been otherwise stable. HPI  Past Medical History  Diagnosis Date  . Hyperlipidemia   . Hypertension   . GERD (gastroesophageal reflux disease)   . Esophageal stricture   . External hemorrhoids   . Arthritis   . TIA (transient ischemic attack)   . Cough   . Breast cancer     left  . Anxiety   . Thrombosed external hemorrhoid, R posterior 01/20/2012    Past Surgical History  Procedure Date  . Abdominal hysterectomy   . Cystectomy in her 20's  . Cholecystectomy 2002  . Tonsillectomy   . Breast lumpectomy 2010    left  . Mastectomy, partial     left  . Heel spur surgery     right    Family History  Problem Relation Age of Onset  . Lung cancer Brother   . Cervical cancer Sister   . Hypertension Father   . Colon cancer Neg Hx   . Esophageal cancer Neg Hx   . Rectal cancer Neg Hx   . Stomach cancer Neg Hx     Social History History  Substance Use Topics  . Smoking status: Never  Smoker   . Smokeless tobacco: Never Used  . Alcohol Use: No    Allergies  Allergen Reactions  . Ibandronate Sodium     REACTION: joint aches  . Latex     unsure  . Sulfonamide Derivatives Hives  . Tessalon (Benzonatate)     Fast heart rate, kept pt awake    Current Outpatient Prescriptions  Medication Sig Dispense Refill  . ALPRAZolam (XANAX) 0.5 MG tablet Take 0.5 mg by mouth 2 (two) times daily.       . AMBULATORY NON FORMULARY MEDICATION Natures Pearls  2 Capsules by mouth once daily      . aspirin 81 MG tablet Take 81 mg by mouth daily.      . ATENOLOL PO Take 50 mg by mouth daily.       Marland Kitchen BIOTIN PO Take by mouth daily.        . calcium carbonate (OS-CAL) 600 MG TABS Take 600 mg by mouth daily.        . Cholecalciferol (VITAMIN D3) 2000 UNITS TABS Take by mouth daily.        . Coenzyme Q10 (COQ10) 200 MG CAPS Take 1 capsule by mouth daily.      . felodipine (PLENDIL) 10 MG 24 hr tablet Take 10 mg by  mouth daily.       . furosemide (LASIX) 40 MG tablet Take 40 mg by mouth daily.       . irbesartan (AVAPRO) 300 MG tablet Take 300 mg by mouth daily.       Maxwell Caul Bicarbonate (ZEGERID PO) Take by mouth as needed.      . potassium chloride SA (K-DUR,KLOR-CON) 20 MEQ tablet Take 20 mEq by mouth daily.       . Rosuvastatin Calcium (CRESTOR PO) Take by mouth.         Review of Systems Review of Systems  Constitutional: Negative for fever, chills and unexpected weight change.  HENT: Negative for hearing loss, congestion, sore throat, trouble swallowing and voice change.   Eyes: Negative for visual disturbance.  Respiratory: Negative for cough and wheezing.   Cardiovascular: Negative for chest pain, palpitations and leg swelling.  Gastrointestinal: Negative for nausea, vomiting, abdominal pain, diarrhea, constipation, blood in stool, abdominal distention and anal bleeding.  Genitourinary: Negative for hematuria, vaginal bleeding and difficulty urinating.    Musculoskeletal: Negative for arthralgias.  Skin: Negative for rash and wound.  Neurological: Negative for seizures, syncope and headaches.  Hematological: Negative for adenopathy. Does not bruise/bleed easily.  Psychiatric/Behavioral: Negative for confusion.    Blood pressure 148/60, pulse 64, temperature 98.4 F (36.9 C), resp. rate 16, height 5\' 6"  (1.676 m), weight 156 lb (70.761 kg).  Physical Exam Physical Exam  Constitutional: She is oriented to person, place, and time. She appears well-developed and well-nourished. No distress.  HENT:  Head: Normocephalic and atraumatic.  Eyes: Conjunctivae and EOM are normal. Pupils are equal, round, and reactive to light. Left eye exhibits no discharge. No scleral icterus.  Neck: Neck supple. No JVD present. No tracheal deviation present. No thyromegaly present.  Cardiovascular: Normal rate, regular rhythm, normal heart sounds and intact distal pulses.   No murmur heard. Pulmonary/Chest: Effort normal and breath sounds normal. No respiratory distress. She has no wheezes. She has no rales. She exhibits no tenderness.       Curvilinear scar left breast, 11:00 to 1:30 position. Well healed. No palpable mass in either breast. No axillary adenopathy either breast. No other skin changes.  Abdominal: Soft. Bowel sounds are normal. She exhibits no distension and no mass. There is no tenderness. There is no rebound and no guarding.  Musculoskeletal: She exhibits no edema and no tenderness.  Lymphadenopathy:    She has no cervical adenopathy.  Neurological: She is alert and oriented to person, place, and time. She exhibits normal muscle tone. Coordination normal.  Skin: Skin is warm. No rash noted. She is not diaphoretic. No erythema. No pallor.  Psychiatric: She has a normal mood and affect. Her behavior is normal. Judgment and thought content normal.    Data Reviewed My chart. Cancer center chart. Mammograms.  Assessment    Invasive cancer left  breast, upper outer quadrant, pathologic stage T1c.  , N0(i+),   ER-positive, HER-2-negative.  No evidence of recurrence 3 years following left partial mastectomy, sentinel node biopsy, and adjuvant radiation therapy.  Intolerance to antiestrogen therapy    Plan    Continue routine medical followup with Dr. Rodrigo Ran  Advised to followup with her oncologist, Dr. Pierce Crane  Repeat bilateral mammograms August 2014.  Return to see me in one year.       Angelia Mould. Derrell Lolling, M.D., [facility] General and Minimally invasive Surgery Breast and Colorectal Surgery Office:   [phone] Pager:  [phone]  06/16/2012, 3:11 PM

## 2012-06-16 NOTE — Patient Instructions (Signed)
Your physical exam shows no evidence of cancer in either breast or lymph nodes. Your recent mammograms are normal.  I advise you to keep your regular appointment with Dr. Pierce Crane.  Be sure to get sure annual mammogram in August of 2014, and return to see Dr. Derrell Lolling in September 2014.

## 2012-08-24 ENCOUNTER — Ambulatory Visit (INDEPENDENT_AMBULATORY_CARE_PROVIDER_SITE_OTHER): Payer: Medicare Other | Admitting: General Surgery

## 2012-08-24 ENCOUNTER — Encounter (INDEPENDENT_AMBULATORY_CARE_PROVIDER_SITE_OTHER): Payer: Self-pay | Admitting: General Surgery

## 2012-08-24 VITALS — BP 158/62 | HR 56 | Temp 98.0°F | Resp 16 | Ht 65.5 in | Wt 155.2 lb

## 2012-08-24 DIAGNOSIS — C50419 Malignant neoplasm of upper-outer quadrant of unspecified female breast: Secondary | ICD-10-CM

## 2012-08-24 NOTE — Progress Notes (Signed)
Patient ID: [name], female   DOB: 12/08/1925, 78 y.o.   MRN: [id number]  Chief Complaint  Patient presents with  . Breast Cancer Long Term Follow Up    HPI [name] is a 78 y.o. female.  She returns for interval breast check. She was having some pain laterally and was concerned.  Past history is significant for left partial mastectomy and sentinel the biopsy on 05/29/2009. She had invasive cancer at the 12:30 position, ER-positive, HER-2-negative, pathologic stage T1c, N0(i+).  She states that she did not receive radiation therapy. She tried to 3 different antiestrogen medications with Dr. Caron Presume and could not tolerate any of them. She is being followed with observation.  She she underwent subsequent left breast biopsy by me on October of 2012 showed atypical ductal hyperplasia. Met last mammograms were 06/03/2012 which looked fine. HPI  Past Medical History  Diagnosis Date  . Hyperlipidemia   . Hypertension   . GERD (gastroesophageal reflux disease)   . Esophageal stricture   . External hemorrhoids   . Arthritis   . TIA (transient ischemic attack)   . Cough   . Breast cancer     left  . Anxiety   . Thrombosed external hemorrhoid, R posterior 01/20/2012    Past Surgical History  Procedure Date  . Abdominal hysterectomy   . Cystectomy in her 20's  . Cholecystectomy 2002  . Tonsillectomy   . Breast lumpectomy 2010    left  . Mastectomy, partial     left  . Heel spur surgery     right    Family History  Problem Relation Age of Onset  . Lung cancer Brother   . Cervical cancer Sister   . Hypertension Father   . Colon cancer Neg Hx   . Esophageal cancer Neg Hx   . Rectal cancer Neg Hx   . Stomach cancer Neg Hx     Social History History  Substance Use Topics  . Smoking status: Never Smoker   . Smokeless tobacco: Never Used  . Alcohol Use: No    Allergies  Allergen Reactions  . Ibandronate Sodium     REACTION: joint aches  . Latex     unsure   . Sulfonamide Derivatives Hives  . Tessalon (Benzonatate)     Fast heart rate, kept pt awake    Current Outpatient Prescriptions  Medication Sig Dispense Refill  . ALPRAZolam (XANAX) 0.5 MG tablet Take 0.5 mg by mouth 2 (two) times daily.       . AMBULATORY NON FORMULARY MEDICATION Natures Pearls  2 Capsules by mouth once daily      . ATENOLOL PO Take 50 mg by mouth daily.       . Cholecalciferol (VITAMIN D3) 2000 UNITS TABS Take by mouth daily.        . Coenzyme Q10 (COQ10) 200 MG CAPS Take 1 capsule by mouth daily.      . felodipine (PLENDIL) 10 MG 24 hr tablet Take 10 mg by mouth daily.       . furosemide (LASIX) 40 MG tablet Take 40 mg by mouth daily.       . potassium chloride SA (K-DUR,KLOR-CON) 20 MEQ tablet Take 20 mEq by mouth daily.         Review of Systems Review of Systems  Constitutional: Negative for fever, chills and unexpected weight change.  HENT: Negative for hearing loss, congestion, sore throat, trouble swallowing and voice change.   Eyes: Negative for  visual disturbance.  Respiratory: Negative for cough and wheezing.   Cardiovascular: Negative for chest pain, palpitations and leg swelling.  Gastrointestinal: Positive for rectal pain. Negative for nausea, vomiting, abdominal pain, diarrhea, constipation, blood in stool, abdominal distention and anal bleeding.       Hemorrhoids.GERD.  Genitourinary: Negative for hematuria, vaginal bleeding and difficulty urinating.  Musculoskeletal: Negative for arthralgias.  Skin: Negative for rash and wound.  Neurological: Negative for seizures, syncope and headaches.  Hematological: Negative for adenopathy. Does not bruise/bleed easily.  Psychiatric/Behavioral: Negative for confusion.    Blood pressure 158/62, pulse 56, temperature 98 F (36.7 C), temperature source Temporal, resp. rate 16, height 5' 5.5" (1.664 m), weight 155 lb 3.2 oz (70.398 kg).  Physical Exam Physical Exam  Constitutional: She is oriented to  person, place, and time. She appears well-developed and well-nourished. No distress.  HENT:  Head: Normocephalic and atraumatic.  Eyes: Conjunctivae normal and EOM are normal. Pupils are equal, round, and reactive to light. Left eye exhibits no discharge. No scleral icterus.  Neck: Neck supple. No JVD present. No tracheal deviation present. No thyromegaly present.  Cardiovascular: Normal rate, regular rhythm and intact distal pulses.   Murmur heard.      Grade 2 systolic murmur  Pulmonary/Chest: Effort normal and breath sounds normal. No respiratory distress. She has no wheezes. She has no rales. She exhibits no tenderness.       Transverse scar left breast 12:00 position. Transverse scar left breast upper outer quadrant. No palpable mass in either breast. No other skin changes. No axillary adenopathy.  Musculoskeletal: She exhibits no edema and no tenderness.  Lymphadenopathy:    She has no cervical adenopathy.  Neurological: She is alert and oriented to person, place, and time. She exhibits normal muscle tone. Coordination normal.  Skin: Skin is warm. No rash noted. She is not diaphoretic. No erythema. No pallor.  Psychiatric: She has a normal mood and affect. Her behavior is normal. Judgment and thought content normal.    Data Reviewed Old chart. Mammograms.  Assessment    Invasive carcinoma left breast, upper outer quadrant, pathologic stage T1c, N0(i+), ER positive, HER-2 negative.  No evidence of recurrence 3 years following left partial mastectomy and sentinel node biopsy    Plan    Repeat bilateral mammograms August 2014.  Her to see me August 2014 after mammograms. I stressed the need for periodic breast followup because she had surgery and no other forms of adjuvant therapy.  Continue medical followup with Dr. Rodrigo Ran.       Angelia Mould. Derrell Lolling, M.D., [facility] General and Minimally invasive Surgery Breast and Colorectal Surgery Office:    [phone] Pager:   [phone]  08/24/2012, 3:51 PM

## 2012-08-24 NOTE — Patient Instructions (Signed)
Your breast exam today is normal. There is no evidence of cancer or any enlarged lymph nodes.  Return to see Dr. Derrell Lolling in August 2014 after you get your annual bilateral mammograms.

## 2013-04-26 ENCOUNTER — Other Ambulatory Visit (INDEPENDENT_AMBULATORY_CARE_PROVIDER_SITE_OTHER): Payer: Self-pay | Admitting: General Surgery

## 2013-04-26 DIAGNOSIS — Z853 Personal history of malignant neoplasm of breast: Secondary | ICD-10-CM

## 2013-06-04 ENCOUNTER — Ambulatory Visit
Admission: RE | Admit: 2013-06-04 | Discharge: 2013-06-04 | Disposition: A | Discharge status: Home / Self Care | Payer: Medicare Other | Source: Ambulatory Visit | Attending: General Surgery | Admitting: General Surgery

## 2013-06-04 DIAGNOSIS — Z853 Personal history of malignant neoplasm of breast: Secondary | ICD-10-CM

## 2013-07-15 ENCOUNTER — Ambulatory Visit (INDEPENDENT_AMBULATORY_CARE_PROVIDER_SITE_OTHER): Payer: Medicare Other | Admitting: General Surgery

## 2013-07-15 ENCOUNTER — Encounter (INDEPENDENT_AMBULATORY_CARE_PROVIDER_SITE_OTHER): Payer: Self-pay

## 2013-07-15 ENCOUNTER — Encounter (INDEPENDENT_AMBULATORY_CARE_PROVIDER_SITE_OTHER): Payer: Self-pay | Admitting: General Surgery

## 2013-07-15 VITALS — BP 108/62 | HR 60 | Temp 98.0°F | Ht 65.5 in | Wt 160.5 lb

## 2013-07-15 DIAGNOSIS — C50412 Malignant neoplasm of upper-outer quadrant of left female breast: Secondary | ICD-10-CM

## 2013-07-15 DIAGNOSIS — C50419 Malignant neoplasm of upper-outer quadrant of unspecified female breast: Secondary | ICD-10-CM

## 2013-07-15 NOTE — Patient Instructions (Signed)
Examination of your breast and all of the lymph node areas today is normal. Your mammograms are also normal. There is no evidence of cancer.  Repeat mammograms in August 2014  Return to see Dr. Derrell Lolling in one year for a breast cancer checkup.

## 2013-07-15 NOTE — Progress Notes (Signed)
Patient ID: [name], female   DOB: [birth year], 79 y.o.   MRN: [id number]  History: This patient returns for a breast cancer check. She  underwent image guided biopsy of the left breast  Oct., 2012 which revealed atypical ductal hyperplasia. She underwent excision of this area with needle localization and the final pathology report shows no evidence of any atypia or malignancy.  Significant history is that she underwent left partial mastectomy and sentinel biopsy on May 29, 2009 for invasive cancer, stage TIc., N0(i+), receptor positive, HER-2/neu negative. She did not have postop radiation therapy. She could not tolerate antiestrogen therapy. She is followed by Dr. Rodrigo Ran.she is no longer followed by a medical oncologist.le She has no complaints about her breast. She fell and injured her ft shoulder this year but it is a getting better. No surgery was required She had bilateral mammograms from 06/04/2013, category 2, no focal abnormality.  ROS: 10 system review of systems is negative except as described above. She is still independent and driving her car  Exam: Patient looks well. No distress. Neck: No adenopathy or mass Heart: Regular rate and rhythm. No murmur. No ectopy Lungs: Right clear to auscultation bilaterally Breasts: Left breast reveals circumareolar scar 12:00, radially oriented transverse scar at 3:00. Left axillary scar. No palpable mass in either breast. No other skin changes. No axillary adenopathy..   Assessment: Atypical ductal hyperplasia left breast, status post wide local excision, Oct., 2012.  History invasive cancer left breast, 12:00 position, status post a partial mastectomy and sentinel the biopsy for T1c., N0(i+), receptor positive HER-2 negative cancer.  She is at increased risk for developing another cancer in the future.  Plan: Because of her risk factors, we will continue annual screening. Mammograms in August 2015 See me in one  year.   Angelia Mould. Derrell Lolling, M.D., [facility] General and Minimally invasive Surgery Breast and Colorectal Surgery Office:   [phone] Pager:   [phone]

## 2014-02-16 ENCOUNTER — Encounter (INDEPENDENT_AMBULATORY_CARE_PROVIDER_SITE_OTHER): Payer: Self-pay

## 2014-02-16 ENCOUNTER — Encounter (INDEPENDENT_AMBULATORY_CARE_PROVIDER_SITE_OTHER): Payer: Self-pay | Admitting: General Surgery

## 2014-02-16 ENCOUNTER — Ambulatory Visit (INDEPENDENT_AMBULATORY_CARE_PROVIDER_SITE_OTHER): Payer: Commercial Managed Care - HMO | Admitting: General Surgery

## 2014-02-16 VITALS — BP 126/74 | HR 62 | Temp 97.6°F | Ht 65.0 in | Wt 156.0 lb

## 2014-02-16 DIAGNOSIS — K648 Other hemorrhoids: Secondary | ICD-10-CM | POA: Insufficient documentation

## 2014-02-16 MED ORDER — HYDROCORTISONE 2.5 % RE CREA: TOPICAL_CREAM | Freq: Two times a day (BID) | RECTAL | Status: DC

## 2014-02-16 MED ORDER — PSYLLIUM 28 % PO PACK: 1.0000 | PACK | Freq: Two times a day (BID) | ORAL | Status: AC

## 2014-02-16 NOTE — Assessment & Plan Note (Signed)
Patient's large posterior internal hemorrhoids were injected. I suspect that she likely had prolapse. Her description was such that this would be consistent with this.  I wrote her a prescription for Anusol HC. I advised her to take fiber supplements. She states when she takes stool softeners, she has incontinence. I also advised her to drink significant amounts of water. I discussed avoiding constipation. She should followup as needed.

## 2014-02-16 NOTE — Patient Instructions (Signed)
Hemorrhoids  Hemorrhoids are enlarged (dilated) veins around the rectum. There are 2 types of hemorrhoids, and the type of hemorrhoid is determined by its location. Internal hemorrhoids occur in the veins just inside the rectum.They are usually not painful, but they may bleed.However, they may poke through to the outside and become irritated and painful. External hemorrhoids involve the veins outside the anus and can be felt as a painful swelling or hard lump near the anus.They are often itchy and may crack and bleed. Sometimes clots will form in the veins. This makes them swollen and painful. These are called thrombosed hemorrhoids. CAUSES Causes of hemorrhoids include:  Pregnancy. This increases the pressure in the hemorrhoidal veins.   Constipation.   Straining to have a bowel movement.   Obesity.   Heavy lifting or other activity that caused you to strain.   TREATMENT Most of the time hemorrhoids improve in 1 to 2 weeks. However, if symptoms do not seem to be getting better or if you have a lot of rectal bleeding, your caregiver may perform a procedure to help make the hemorrhoids get smaller or remove them completely.Possible treatments include:  Rubber band ligation. A rubber band is placed at the base of the hemorrhoid to cut off the circulation.   Sclerotherapy. A chemical is injected to shrink the hemorrhoid.   Infrared light therapy. Tools are used to burn the hemorrhoid.   Hemorrhoidectomy. This is surgical removal of the hemorrhoid.   HOME CARE INSTRUCTIONS   Increase fiber in your diet. Ask your caregiver about using fiber supplements.   Drink enough water and fluids to keep your urine clear or pale yellow.   Exercise regularly.   Go to the bathroom when you have the urge to have a bowel movement. Do not wait.   Avoid straining to have bowel movements.   Keep the anal area dry and clean.   Only take over-the-counter or prescription medicines for pain,  discomfort, or fever as directed by your caregiver.   Take warm sitz baths for 20 to 30 minutes, 3 to 4 times per day.   If the hemorrhoids are very tender and swollen, place ice packs on the area as tolerated. Using ice packs between sitz baths may be helpful. Fill a plastic bag with ice. Place a towel between the bag of ice and your skin.   Medicated creams and suppositories may be used or applied as directed.   Do not use a donut-shaped pillow or sit on the toilet for long periods. This increases blood pooling and pain.   SEEK MEDICAL CARE IF:   You have increasing pain and swelling that is not controlled with your medicine.   You have uncontrolled bleeding.   You have difficulty or you are unable to have a bowel movement.   You have pain or inflammation outside the area of the hemorrhoids.   You have chills or an oral temperature above 102 F (38.9 C).     

## 2014-02-16 NOTE — Progress Notes (Signed)
Subjective:     Patient ID: [name], female   DOB: Jun 11, 1926, 80 y.o.   MRN: [id number]  HPI Patient is a breast cancer patient of Dr. Darrel Hoover who presents with complaints of hemorrhoids. She states that she has had an on and off for years. She feels like they swell and she felt like they are hard. She states that she has taken warm water baths and this helps some. She has hard stool on and off and has difficulty having bowel movements. She did try taking a daily stool softener earlier and that this made her have incontinence.  She has had some bleeding but not any significant bleeding. She tried some prescription cream in the past. She has required I and D. of a thrombosed hemorrhoid in the past.  Review of Systems  All other systems reviewed and are negative.      Objective:   Physical Exam  Constitutional: She is oriented to person, place, and time. She appears well-developed and well-nourished. No distress.  HENT:  Head: Normocephalic and atraumatic.  Eyes: Conjunctivae are normal. Pupils are equal, round, and reactive to light. No scleral icterus.  Cardiovascular: Normal rate.   Pulmonary/Chest: Effort normal. No respiratory distress.  Abdominal: Soft.  Genitourinary: Rectal exam shows internal hemorrhoid and anal tone abnormal (patulous sphincter).  Anoscopy demonstrated large posterior hemorrhoids internally.  These were injected with almond oil.    Neurological: She is alert and oriented to person, place, and time.  Skin: Skin is warm and dry. She is not diaphoretic.  Psychiatric: She has a normal mood and affect. Her behavior is normal. Judgment and thought content normal.       Assessment/Plan:     Internal hemorrhoids with complication with suspected prolapse Patient's large posterior internal hemorrhoids were injected. I suspect that she likely had prolapse. Her description was such that this would be consistent with this.  I wrote her a prescription for Anusol  HC. I advised her to take fiber supplements. She states when she takes stool softeners, she has incontinence. I also advised her to drink significant amounts of water. I discussed avoiding constipation. She should followup as needed.

## 2014-04-11 ENCOUNTER — Other Ambulatory Visit (INDEPENDENT_AMBULATORY_CARE_PROVIDER_SITE_OTHER): Payer: Self-pay | Admitting: General Surgery

## 2014-04-11 DIAGNOSIS — Z9889 Other specified postprocedural states: Secondary | ICD-10-CM

## 2014-04-11 DIAGNOSIS — Z853 Personal history of malignant neoplasm of breast: Secondary | ICD-10-CM

## 2014-05-31 ENCOUNTER — Encounter: Payer: Self-pay | Admitting: Internal Medicine

## 2014-06-06 ENCOUNTER — Encounter (INDEPENDENT_AMBULATORY_CARE_PROVIDER_SITE_OTHER): Payer: Self-pay

## 2014-06-06 ENCOUNTER — Ambulatory Visit
Admission: RE | Admit: 2014-06-06 | Discharge: 2014-06-06 | Disposition: A | Discharge status: Home / Self Care | Payer: Commercial Managed Care - HMO | Source: Ambulatory Visit | Attending: General Surgery | Admitting: General Surgery

## 2014-06-06 DIAGNOSIS — Z9889 Other specified postprocedural states: Secondary | ICD-10-CM

## 2014-06-06 DIAGNOSIS — Z853 Personal history of malignant neoplasm of breast: Secondary | ICD-10-CM

## 2015-04-12 ENCOUNTER — Ambulatory Visit (HOSPITAL_COMMUNITY)
Admission: RE | Admit: 2015-04-12 | Discharge: 2015-04-12 | Disposition: A | Discharge status: Home / Self Care | Payer: Commercial Managed Care - HMO | Source: Ambulatory Visit | Attending: Internal Medicine | Admitting: Internal Medicine

## 2015-04-24 ENCOUNTER — Ambulatory Visit (HOSPITAL_COMMUNITY): Payer: Commercial Managed Care - HMO

## 2015-05-11 ENCOUNTER — Other Ambulatory Visit: Payer: Self-pay | Admitting: General Surgery

## 2015-05-11 DIAGNOSIS — Z853 Personal history of malignant neoplasm of breast: Secondary | ICD-10-CM

## 2015-05-11 DIAGNOSIS — Z9889 Other specified postprocedural states: Secondary | ICD-10-CM

## 2015-06-13 ENCOUNTER — Ambulatory Visit
Admission: RE | Admit: 2015-06-13 | Discharge: 2015-06-13 | Disposition: A | Discharge status: Home / Self Care | Payer: Commercial Managed Care - HMO | Source: Ambulatory Visit | Attending: General Surgery | Admitting: General Surgery

## 2015-06-13 DIAGNOSIS — Z853 Personal history of malignant neoplasm of breast: Secondary | ICD-10-CM

## 2015-06-13 DIAGNOSIS — Z9889 Other specified postprocedural states: Secondary | ICD-10-CM

## 2015-07-24 ENCOUNTER — Emergency Department (HOSPITAL_BASED_OUTPATIENT_CLINIC_OR_DEPARTMENT_OTHER): Payer: Commercial Managed Care - HMO

## 2015-07-24 ENCOUNTER — Emergency Department (HOSPITAL_BASED_OUTPATIENT_CLINIC_OR_DEPARTMENT_OTHER)
Admission: EM | Admit: 2015-07-24 | Discharge: 2015-07-24 | Disposition: A | Discharge status: Home / Self Care | Payer: Commercial Managed Care - HMO | Attending: Emergency Medicine | Admitting: Emergency Medicine

## 2015-07-24 ENCOUNTER — Encounter (HOSPITAL_BASED_OUTPATIENT_CLINIC_OR_DEPARTMENT_OTHER): Payer: Self-pay | Admitting: Emergency Medicine

## 2015-07-24 DIAGNOSIS — Y998 Other external cause status: Secondary | ICD-10-CM | POA: Insufficient documentation

## 2015-07-24 DIAGNOSIS — Z8639 Personal history of other endocrine, nutritional and metabolic disease: Secondary | ICD-10-CM | POA: Diagnosis not present

## 2015-07-24 DIAGNOSIS — M1990 Unspecified osteoarthritis, unspecified site: Secondary | ICD-10-CM | POA: Insufficient documentation

## 2015-07-24 DIAGNOSIS — Y9289 Other specified places as the place of occurrence of the external cause: Secondary | ICD-10-CM | POA: Insufficient documentation

## 2015-07-24 DIAGNOSIS — S52611A Displaced fracture of right ulna styloid process, initial encounter for closed fracture: Secondary | ICD-10-CM

## 2015-07-24 DIAGNOSIS — Z853 Personal history of malignant neoplasm of breast: Secondary | ICD-10-CM | POA: Insufficient documentation

## 2015-07-24 DIAGNOSIS — Z7952 Long term (current) use of systemic steroids: Secondary | ICD-10-CM | POA: Insufficient documentation

## 2015-07-24 DIAGNOSIS — Z91040 Latex allergy status: Secondary | ICD-10-CM | POA: Diagnosis not present

## 2015-07-24 DIAGNOSIS — Z8719 Personal history of other diseases of the digestive system: Secondary | ICD-10-CM | POA: Insufficient documentation

## 2015-07-24 DIAGNOSIS — W010XXA Fall on same level from slipping, tripping and stumbling without subsequent striking against object, initial encounter: Secondary | ICD-10-CM | POA: Diagnosis not present

## 2015-07-24 DIAGNOSIS — F419 Anxiety disorder, unspecified: Secondary | ICD-10-CM | POA: Insufficient documentation

## 2015-07-24 DIAGNOSIS — Z79899 Other long term (current) drug therapy: Secondary | ICD-10-CM | POA: Diagnosis not present

## 2015-07-24 DIAGNOSIS — Z8673 Personal history of transient ischemic attack (TIA), and cerebral infarction without residual deficits: Secondary | ICD-10-CM | POA: Insufficient documentation

## 2015-07-24 DIAGNOSIS — Y9389 Activity, other specified: Secondary | ICD-10-CM | POA: Insufficient documentation

## 2015-07-24 DIAGNOSIS — I10 Essential (primary) hypertension: Secondary | ICD-10-CM | POA: Insufficient documentation

## 2015-07-24 DIAGNOSIS — S6991XA Unspecified injury of right wrist, hand and finger(s), initial encounter: Secondary | ICD-10-CM | POA: Diagnosis present

## 2015-07-24 MED ORDER — HYDROCODONE-ACETAMINOPHEN 5-325 MG PO TABS
1.0000 | ORAL_TABLET | Freq: Once | ORAL | Status: AC
  Administered 2015-07-24: 1 via ORAL
  Filled 2015-07-24: qty 1

## 2015-07-24 MED ORDER — HYDROCODONE-ACETAMINOPHEN 5-325 MG PO TABS: 1.0000 | ORAL_TABLET | Freq: Four times a day (QID) | ORAL | Status: DC | PRN

## 2015-07-24 NOTE — Discharge Instructions (Signed)
Return here as needed. Follow up with your orthopedist. °

## 2015-07-24 NOTE — ED Notes (Signed)
Patient states that she fell out of the door last night and is now hurting to her right lateral wrist.

## 2015-07-24 NOTE — ED Provider Notes (Signed)
81 year old female tripped and fell last night injuring her right wrist. She has tenderness along the ulnar aspect of the wrist. X-rays showed a fracture of the ulnar styloid. She is placed in a splint and referred to hand surgery for follow-up.  Medical screening examination/treatment/procedure(s) were conducted as a shared visit with non-physician practitioner(s) and myself.  I personally evaluated the patient during the encounter.    Delora Fuel, MD 31/12/16 [id number]

## 2015-08-22 NOTE — ED Provider Notes (Signed)
CSN: [id number]     Arrival date & time 07/24/15  1553 History   First MD Initiated Contact with Patient 07/24/15 1749     Chief Complaint  Patient presents with  . Wrist Pain     (Consider location/radiation/quality/duration/timing/severity/associated sxs/prior Treatment) HPI Patient presents to the emergency department with a right wrist injury that occurred last night.  Patient states she slipped and fell, landing on her right wrist.  Patient states she did not have any other injuries.  Patient denies chest pain, shortness of breath, weakness, dizziness, headache, blurred vision, back pain, neck pain, fever, abdominal pain, or syncope.  The patient states that he is no pain in her hips.  Patient also denies any other injuries.  Patient states that movement and palpation make the pain worse.  She did not take any medications prior to arrival for her symptoms Past Medical History  Diagnosis Date  . Hyperlipidemia   . Hypertension   . GERD (gastroesophageal reflux disease)   . Esophageal stricture   . External hemorrhoids   . Arthritis   . TIA (transient ischemic attack)   . Cough   . Breast cancer ([place])     left  . Anxiety   . Thrombosed external hemorrhoid, R posterior 01/20/2012   Past Surgical History  Procedure Laterality Date  . Abdominal hysterectomy    . Cystectomy  in her 20's  . Cholecystectomy  2002  . Tonsillectomy    . Breast lumpectomy  2010    left  . Mastectomy, partial      left  . Heel spur surgery      right   Family History  Problem Relation Age of Onset  . Lung cancer Brother   . Cervical cancer Sister   . Hypertension Father   . Colon cancer Neg Hx   . Esophageal cancer Neg Hx   . Rectal cancer Neg Hx   . Stomach cancer Neg Hx    Social History  Substance Use Topics  . Smoking status: Never Smoker   . Smokeless tobacco: Never Used  . Alcohol Use: No   OB History    No data available     Review of Systems All other systems negative  except as documented in the HPI. All pertinent positives and negatives as reviewed in the HPI.  Allergies  Ibandronate sodium; Latex; Sulfonamide derivatives; and Tessalon  Home Medications   Prior to Admission medications   Medication Sig Start Date End Date Taking? Authorizing Provider  ALPRAZolam Duanne Moron) 0.5 MG tablet Take 0.5 mg by mouth 2 (two) times daily.     Historical Provider, MD  AMBULATORY NON FORMULARY MEDICATION Natures Pearls  2 Capsules by mouth once daily    Historical Provider, MD  amitriptyline (ELAVIL) 50 MG tablet  02/02/14   Historical Provider, MD  ATENOLOL PO Take 50 mg by mouth daily.     Historical Provider, MD  Cholecalciferol (VITAMIN D3) 2000 UNITS TABS Take by mouth daily.      Historical Provider, MD  Coenzyme Q10 (COQ10) 200 MG CAPS Take 1 capsule by mouth daily.    Historical Provider, MD  felodipine (PLENDIL) 10 MG 24 hr tablet Take 10 mg by mouth daily.  06/06/11   Historical Provider, MD  furosemide (LASIX) 40 MG tablet Take 40 mg by mouth daily.  04/05/11   Historical Provider, MD  HYDROcodone-acetaminophen (NORCO/VICODIN) 5-325 MG tablet Take 1 tablet by mouth every 6 (six) hours as needed for moderate pain. 07/24/15  Dalia Heading, PA-C  hydrocortisone (PROCTOSOL HC) 2.5 % rectal cream Place rectally 2 (two) times daily. 02/16/14   Stark Klein, MD  potassium chloride SA (K-DUR,KLOR-CON) 20 MEQ tablet Take 20 mEq by mouth daily.  05/07/11   Historical Provider, MD  psyllium (METAMUCIL SMOOTH TEXTURE) 28 % packet Take 1 packet by mouth 2 (two) times daily. 02/16/14   Stark Klein, MD   BP 154/71 mmHg  Pulse 66  Temp(Src) 98.6 F (37 C) (Oral)  Resp 16  Ht 5\' 6"  (1.676 m)  Wt 151 lb (68.493 kg)  BMI 24.38 kg/m2  SpO2 100% Physical Exam  Constitutional: She is oriented to person, place, and time. She appears well-developed and well-nourished. No distress.  HENT:  Head: Normocephalic and atraumatic.  Mouth/Throat: Oropharynx is clear and moist.   Eyes: Pupils are equal, round, and reactive to light.  Neck: Normal range of motion. Neck supple.  Cardiovascular: Normal rate, regular rhythm and normal heart sounds.  Exam reveals no gallop and no friction rub.   No murmur heard. Pulmonary/Chest: Effort normal and breath sounds normal. No respiratory distress. She has no wheezes.  Musculoskeletal:       Right wrist: She exhibits decreased range of motion, tenderness, bony tenderness and swelling. She exhibits no effusion, no crepitus and no deformity.  Neurological: She is alert and oriented to person, place, and time. She exhibits normal muscle tone. Coordination normal.  Skin: Skin is warm and dry. No rash noted. No erythema.  Psychiatric: She has a normal mood and affect. Her behavior is normal.  Nursing note and vitals reviewed.   ED Course  Procedures (including critical care time) Labs Review Labs Reviewed - No data to display  Imaging Review No results found. I have personally reviewed and evaluated these images and lab results as part of my medical decision-making. Patient was splinted and will have follow-up with hand surgery.  Told to return here as needed.  Ice and elevate  Dalia Heading, PA-C [id number]  Delora Fuel, MD [id number]

## 2015-10-11 ENCOUNTER — Other Ambulatory Visit: Payer: Self-pay | Admitting: Internal Medicine

## 2015-10-11 DIAGNOSIS — R590 Localized enlarged lymph nodes: Secondary | ICD-10-CM

## 2015-10-16 ENCOUNTER — Other Ambulatory Visit: Payer: Commercial Managed Care - HMO

## 2015-10-17 ENCOUNTER — Ambulatory Visit
Admission: RE | Admit: 2015-10-17 | Discharge: 2015-10-17 | Disposition: A | Discharge status: Home / Self Care | Payer: Commercial Managed Care - HMO | Source: Ambulatory Visit | Attending: Internal Medicine | Admitting: Internal Medicine

## 2015-10-17 DIAGNOSIS — R590 Localized enlarged lymph nodes: Secondary | ICD-10-CM

## 2015-10-17 MED ORDER — IOPAMIDOL (ISOVUE-300) INJECTION 61%
60.0000 mL | Freq: Once | INTRAVENOUS | Status: AC | PRN
  Administered 2015-10-17: 60 mL via INTRAVENOUS

## 2016-02-26 ENCOUNTER — Encounter: Payer: Self-pay | Admitting: Internal Medicine

## 2016-07-22 ENCOUNTER — Other Ambulatory Visit: Payer: Self-pay | Admitting: General Surgery

## 2016-07-22 DIAGNOSIS — Z9889 Other specified postprocedural states: Secondary | ICD-10-CM

## 2016-07-22 DIAGNOSIS — Z853 Personal history of malignant neoplasm of breast: Secondary | ICD-10-CM

## 2016-07-31 ENCOUNTER — Inpatient Hospital Stay: Admission: RE | Admit: 2016-07-31 | Payer: Commercial Managed Care - HMO | Source: Ambulatory Visit

## 2016-10-10 DIAGNOSIS — E038 Other specified hypothyroidism: Secondary | ICD-10-CM | POA: Diagnosis not present

## 2016-10-10 DIAGNOSIS — R7301 Impaired fasting glucose: Secondary | ICD-10-CM | POA: Diagnosis not present

## 2016-10-10 DIAGNOSIS — I10 Essential (primary) hypertension: Secondary | ICD-10-CM | POA: Diagnosis not present

## 2016-10-10 DIAGNOSIS — E784 Other hyperlipidemia: Secondary | ICD-10-CM | POA: Diagnosis not present

## 2016-10-10 DIAGNOSIS — E559 Vitamin D deficiency, unspecified: Secondary | ICD-10-CM | POA: Diagnosis not present

## 2016-10-10 DIAGNOSIS — R8299 Other abnormal findings in urine: Secondary | ICD-10-CM | POA: Diagnosis not present

## 2016-10-17 DIAGNOSIS — Z1389 Encounter for screening for other disorder: Secondary | ICD-10-CM | POA: Diagnosis not present

## 2016-10-17 DIAGNOSIS — E784 Other hyperlipidemia: Secondary | ICD-10-CM | POA: Diagnosis not present

## 2016-10-17 DIAGNOSIS — Z0000 Encounter for general adult medical examination without abnormal findings: Secondary | ICD-10-CM | POA: Diagnosis not present

## 2016-10-17 DIAGNOSIS — Z6825 Body mass index (BMI) 25.0-25.9, adult: Secondary | ICD-10-CM | POA: Diagnosis not present

## 2016-10-17 DIAGNOSIS — Z1231 Encounter for screening mammogram for malignant neoplasm of breast: Secondary | ICD-10-CM | POA: Diagnosis not present

## 2016-10-17 DIAGNOSIS — R590 Localized enlarged lymph nodes: Secondary | ICD-10-CM | POA: Diagnosis not present

## 2016-10-17 DIAGNOSIS — I272 Other secondary pulmonary hypertension: Secondary | ICD-10-CM | POA: Diagnosis not present

## 2016-10-17 DIAGNOSIS — C50912 Malignant neoplasm of unspecified site of left female breast: Secondary | ICD-10-CM | POA: Diagnosis not present

## 2016-10-17 DIAGNOSIS — I6789 Other cerebrovascular disease: Secondary | ICD-10-CM | POA: Diagnosis not present

## 2016-10-17 DIAGNOSIS — M810 Age-related osteoporosis without current pathological fracture: Secondary | ICD-10-CM | POA: Diagnosis not present

## 2016-10-17 DIAGNOSIS — R011 Cardiac murmur, unspecified: Secondary | ICD-10-CM | POA: Diagnosis not present

## 2016-10-17 DIAGNOSIS — I10 Essential (primary) hypertension: Secondary | ICD-10-CM | POA: Diagnosis not present

## 2017-04-22 DIAGNOSIS — M810 Age-related osteoporosis without current pathological fracture: Secondary | ICD-10-CM | POA: Diagnosis not present

## 2017-04-28 DIAGNOSIS — M1990 Unspecified osteoarthritis, unspecified site: Secondary | ICD-10-CM | POA: Diagnosis not present

## 2017-04-28 DIAGNOSIS — I10 Essential (primary) hypertension: Secondary | ICD-10-CM | POA: Diagnosis not present

## 2017-04-28 DIAGNOSIS — Z6825 Body mass index (BMI) 25.0-25.9, adult: Secondary | ICD-10-CM | POA: Diagnosis not present

## 2017-04-28 DIAGNOSIS — E038 Other specified hypothyroidism: Secondary | ICD-10-CM | POA: Diagnosis not present

## 2017-04-28 DIAGNOSIS — M810 Age-related osteoporosis without current pathological fracture: Secondary | ICD-10-CM | POA: Diagnosis not present

## 2017-11-10 DIAGNOSIS — R7301 Impaired fasting glucose: Secondary | ICD-10-CM | POA: Diagnosis not present

## 2017-11-10 DIAGNOSIS — M810 Age-related osteoporosis without current pathological fracture: Secondary | ICD-10-CM | POA: Diagnosis not present

## 2017-11-10 DIAGNOSIS — R82998 Other abnormal findings in urine: Secondary | ICD-10-CM | POA: Diagnosis not present

## 2017-11-10 DIAGNOSIS — I10 Essential (primary) hypertension: Secondary | ICD-10-CM | POA: Diagnosis not present

## 2017-11-10 DIAGNOSIS — E7849 Other hyperlipidemia: Secondary | ICD-10-CM | POA: Diagnosis not present

## 2017-11-10 DIAGNOSIS — E038 Other specified hypothyroidism: Secondary | ICD-10-CM | POA: Diagnosis not present

## 2017-11-17 DIAGNOSIS — R590 Localized enlarged lymph nodes: Secondary | ICD-10-CM | POA: Diagnosis not present

## 2017-11-17 DIAGNOSIS — I6789 Other cerebrovascular disease: Secondary | ICD-10-CM | POA: Diagnosis not present

## 2017-11-17 DIAGNOSIS — I2789 Other specified pulmonary heart diseases: Secondary | ICD-10-CM | POA: Diagnosis not present

## 2017-11-17 DIAGNOSIS — C50912 Malignant neoplasm of unspecified site of left female breast: Secondary | ICD-10-CM | POA: Diagnosis not present

## 2017-11-17 DIAGNOSIS — K228 Other specified diseases of esophagus: Secondary | ICD-10-CM | POA: Diagnosis not present

## 2017-11-17 DIAGNOSIS — Z6825 Body mass index (BMI) 25.0-25.9, adult: Secondary | ICD-10-CM | POA: Diagnosis not present

## 2017-11-17 DIAGNOSIS — Z0000 Encounter for general adult medical examination without abnormal findings: Secondary | ICD-10-CM | POA: Diagnosis not present

## 2017-11-17 DIAGNOSIS — M810 Age-related osteoporosis without current pathological fracture: Secondary | ICD-10-CM | POA: Diagnosis not present

## 2017-11-17 DIAGNOSIS — R5383 Other fatigue: Secondary | ICD-10-CM | POA: Diagnosis not present

## 2017-11-17 DIAGNOSIS — Z1389 Encounter for screening for other disorder: Secondary | ICD-10-CM | POA: Diagnosis not present

## 2017-11-17 DIAGNOSIS — R197 Diarrhea, unspecified: Secondary | ICD-10-CM | POA: Diagnosis not present

## 2017-11-17 DIAGNOSIS — M25519 Pain in unspecified shoulder: Secondary | ICD-10-CM | POA: Diagnosis not present

## 2017-12-17 ENCOUNTER — Emergency Department (HOSPITAL_COMMUNITY): Payer: PPO

## 2017-12-17 ENCOUNTER — Observation Stay (HOSPITAL_COMMUNITY)
Admission: EM | Admit: 2017-12-17 | Discharge: 2017-12-18 | Disposition: A | Discharge status: Home / Self Care | Payer: PPO | Attending: Internal Medicine | Admitting: Internal Medicine

## 2017-12-17 ENCOUNTER — Encounter (HOSPITAL_COMMUNITY): Payer: Self-pay | Admitting: Emergency Medicine

## 2017-12-17 ENCOUNTER — Other Ambulatory Visit: Payer: Self-pay

## 2017-12-17 DIAGNOSIS — S32511A Fracture of superior rim of right pubis, initial encounter for closed fracture: Secondary | ICD-10-CM | POA: Diagnosis not present

## 2017-12-17 DIAGNOSIS — W109XXA Fall (on) (from) unspecified stairs and steps, initial encounter: Secondary | ICD-10-CM | POA: Insufficient documentation

## 2017-12-17 DIAGNOSIS — C50419 Malignant neoplasm of upper-outer quadrant of unspecified female breast: Secondary | ICD-10-CM | POA: Diagnosis present

## 2017-12-17 DIAGNOSIS — S32599A Other specified fracture of unspecified pubis, initial encounter for closed fracture: Secondary | ICD-10-CM | POA: Diagnosis present

## 2017-12-17 DIAGNOSIS — F419 Anxiety disorder, unspecified: Secondary | ICD-10-CM | POA: Insufficient documentation

## 2017-12-17 DIAGNOSIS — R269 Unspecified abnormalities of gait and mobility: Secondary | ICD-10-CM | POA: Diagnosis not present

## 2017-12-17 DIAGNOSIS — S0990XA Unspecified injury of head, initial encounter: Secondary | ICD-10-CM | POA: Diagnosis not present

## 2017-12-17 DIAGNOSIS — S32591A Other specified fracture of right pubis, initial encounter for closed fracture: Principal | ICD-10-CM | POA: Insufficient documentation

## 2017-12-17 DIAGNOSIS — Z9012 Acquired absence of left breast and nipple: Secondary | ICD-10-CM | POA: Diagnosis not present

## 2017-12-17 DIAGNOSIS — N183 Chronic kidney disease, stage 3 unspecified: Secondary | ICD-10-CM

## 2017-12-17 DIAGNOSIS — M25551 Pain in right hip: Secondary | ICD-10-CM | POA: Diagnosis present

## 2017-12-17 DIAGNOSIS — Z853 Personal history of malignant neoplasm of breast: Secondary | ICD-10-CM | POA: Diagnosis not present

## 2017-12-17 DIAGNOSIS — S329XXA Fracture of unspecified parts of lumbosacral spine and pelvis, initial encounter for closed fracture: Secondary | ICD-10-CM | POA: Diagnosis present

## 2017-12-17 DIAGNOSIS — I10 Essential (primary) hypertension: Secondary | ICD-10-CM | POA: Diagnosis present

## 2017-12-17 DIAGNOSIS — I129 Hypertensive chronic kidney disease with stage 1 through stage 4 chronic kidney disease, or unspecified chronic kidney disease: Secondary | ICD-10-CM | POA: Diagnosis not present

## 2017-12-17 DIAGNOSIS — Z79899 Other long term (current) drug therapy: Secondary | ICD-10-CM | POA: Insufficient documentation

## 2017-12-17 DIAGNOSIS — Z8673 Personal history of transient ischemic attack (TIA), and cerebral infarction without residual deficits: Secondary | ICD-10-CM | POA: Insufficient documentation

## 2017-12-17 DIAGNOSIS — K222 Esophageal obstruction: Secondary | ICD-10-CM

## 2017-12-17 DIAGNOSIS — S3993XA Unspecified injury of pelvis, initial encounter: Secondary | ICD-10-CM | POA: Diagnosis not present

## 2017-12-17 DIAGNOSIS — R0989 Other specified symptoms and signs involving the circulatory and respiratory systems: Secondary | ICD-10-CM | POA: Diagnosis not present

## 2017-12-17 LAB — URINALYSIS, ROUTINE W REFLEX MICROSCOPIC
BACTERIA UA: NONE SEEN
BILIRUBIN URINE: NEGATIVE
Glucose, UA: NEGATIVE mg/dL
Hgb urine dipstick: NEGATIVE
KETONES UR: NEGATIVE mg/dL
LEUKOCYTES UA: NEGATIVE
Nitrite: NEGATIVE
PROTEIN: NEGATIVE mg/dL
Specific Gravity, Urine: 1.008 (ref 1.005–1.030)
WBC UA: NONE SEEN WBC/hpf (ref 0–5)
pH: 7 (ref 5.0–8.0)

## 2017-12-17 MED ORDER — ONDANSETRON HCL 4 MG/2ML IJ SOLN
4.0000 mg | Freq: Once | INTRAMUSCULAR | Status: AC
  Administered 2017-12-17: 4 mg via INTRAVENOUS
  Filled 2017-12-17: qty 2

## 2017-12-17 MED ORDER — MORPHINE SULFATE (PF) 4 MG/ML IV SOLN
4.0000 mg | Freq: Once | INTRAVENOUS | Status: AC
  Administered 2017-12-17: 4 mg via INTRAVENOUS
  Filled 2017-12-17: qty 1

## 2017-12-17 NOTE — ED Triage Notes (Addendum)
Pt complaint of right hip pain post tripping up step on porch resulting in fall. Event 1500 today.

## 2017-12-17 NOTE — ED Provider Notes (Signed)
Medical screening examination/treatment/procedure(s) were conducted as a shared visit with non-physician practitioner(s) and myself.  I personally evaluated the patient during the encounter.   84 yo F with PMHx HTN, HLD, here with mechanical fall and right hip pain. On exam, pt with significant R hip/pelvic pain, but no other complaints. Abdomen is soft, NT, ND. No headache or signs of head trauma. No chest trauma. Lungs are CTAB, heart RRR. Will check CT, screening labs, plan for admission for pain control, monitoring, PT/OT. Pt updated and in agreement. Not on blood thinners. HDS.   Duffy Bruce, MD 12/17/17 [phone]

## 2017-12-17 NOTE — ED Provider Notes (Signed)
[facility] DEPT Provider Note   CSN: [id number] Arrival date & time: 12/17/17  1748     History   Chief Complaint Chief Complaint  Patient presents with  . Hip Pain    HPI [name] is a 84 y.o. female.  Patient presents the emergency department today after a fall from about 3 steps in height onto the ground.  This occurred approximately 3 PM today.  Patient was trying to come down the stairs and stumbled.  She landed on her right side.  The brunt of the impact was on her right hip and right shoulder.  She has not been able to walk since the accident.  She currently complains of right hip pain.  She denies any pain in her right shoulder or upper extremity.  No neck or back pain.  No headache, vision change, nausea or vomiting.  No weakness in the arms or the legs.  No chest pain, shortness of breath, or abdominal pains.  No treatments prior to arrival.  The onset of this condition was acute. The course is constant. Aggravating factors: none. Alleviating factors: none.        Past Medical History:  Diagnosis Date  . Anxiety   . Arthritis   . Breast cancer ([place])    left  . Cough   . Esophageal stricture   . External hemorrhoids   . GERD (gastroesophageal reflux disease)   . Hyperlipidemia   . Hypertension   . Thrombosed external hemorrhoid, R posterior 01/20/2012  . TIA (transient ischemic attack)     Patient Active Problem List   Diagnosis Date Noted  . Internal hemorrhoids with complication with suspected prolapse 02/16/2014  . Cancer of upper-outer quadrant of female breast ([place]) 06/16/2012  . Hemorrhoids, internal, thrombosed, Left lateral 02/10/2012  . CONSTIPATION 04/18/2008  . HYPERLIPIDEMIA 04/15/2008  . HYPERTENSION 04/15/2008  . TRANSIENT ISCHEMIC ATTACK 04/15/2008  . ESOPHAGEAL STRICTURE 04/15/2008  . GERD 04/15/2008  . ARTHRITIS 04/15/2008    Past Surgical History:  Procedure Laterality Date  . ABDOMINAL  HYSTERECTOMY    . BREAST LUMPECTOMY  2010   left  . CHOLECYSTECTOMY  2002  . CYSTECTOMY  in her 80's  . HEEL SPUR SURGERY     right  . MASTECTOMY, PARTIAL     left  . TONSILLECTOMY      OB History    No data available       Home Medications    Prior to Admission medications   Medication Sig Start Date End Date Taking? Authorizing Provider  ALPRAZolam Duanne Moron) 0.5 MG tablet Take 0.5 mg by mouth 2 (two) times daily.     [provider]  AMBULATORY NON FORMULARY MEDICATION Natures Pearls  2 Capsules by mouth once daily    [provider]  amitriptyline (ELAVIL) 50 MG tablet  02/02/14   [provider]  ATENOLOL PO Take 50 mg by mouth daily.     [provider]  Cholecalciferol (VITAMIN D3) 2000 UNITS TABS Take by mouth daily.      [provider]  Coenzyme Q10 (COQ10) 200 MG CAPS Take 1 capsule by mouth daily.    [provider]  felodipine (PLENDIL) 10 MG 24 hr tablet Take 10 mg by mouth daily.  06/06/11   [provider]  furosemide (LASIX) 40 MG tablet Take 40 mg by mouth daily.  04/05/11   [provider]  HYDROcodone-acetaminophen (NORCO/VICODIN) 5-325 MG tablet Take 1 tablet by mouth  every 6 (six) hours as needed for moderate pain. 07/24/15   Lawyer, Harrell Gave, PA-C  hydrocortisone (PROCTOSOL HC) 2.5 % rectal cream Place rectally 2 (two) times daily. 02/16/14   Stark Klein, MD  potassium chloride SA (K-DUR,KLOR-CON) 20 MEQ tablet Take 20 mEq by mouth daily.  05/07/11   [provider]  psyllium (METAMUCIL SMOOTH TEXTURE) 28 % packet Take 1 packet by mouth 2 (two) times daily. 02/16/14   Stark Klein, MD    Family History Family History  Problem Relation Age of Onset  . Lung cancer Brother   . Cervical cancer Sister   . Hypertension Father   . Colon cancer Neg Hx   . Esophageal cancer Neg Hx   . Rectal cancer Neg Hx   . Stomach cancer Neg Hx     Social History Social History   Tobacco  Use  . Smoking status: Never Smoker  . Smokeless tobacco: Never Used  Substance Use Topics  . Alcohol use: No  . Drug use: No     Allergies   Ibandronate sodium; Latex; Sulfonamide derivatives; and Tessalon [benzonatate]   Review of Systems Review of Systems  Constitutional: Negative for fever.  HENT: Negative for congestion, dental problem, rhinorrhea, sinus pressure and sore throat.   Eyes: Negative for photophobia, discharge, redness and visual disturbance.  Respiratory: Negative for cough and shortness of breath.   Cardiovascular: Negative for chest pain.  Gastrointestinal: Negative for abdominal pain, diarrhea, nausea and vomiting.  Genitourinary: Negative for dysuria.  Musculoskeletal: Positive for arthralgias, gait problem and myalgias. Negative for neck pain and neck stiffness.  Skin: Negative for rash.  Neurological: Negative for syncope, speech difficulty, weakness, light-headedness, numbness and headaches.  Psychiatric/Behavioral: Negative for confusion.     Physical Exam Updated Vital Signs BP (!) 149/69 (BP Location: Left Arm)   Pulse 70   Temp 98.6 F (37 C) (Oral)   Resp 20   SpO2 96%   Physical Exam  Constitutional: She appears well-developed and well-nourished.  HENT:  Head: Normocephalic and atraumatic.  Eyes: Conjunctivae are normal. Right eye exhibits no discharge. Left eye exhibits no discharge.  Neck: Normal range of motion. Neck supple.  Cardiovascular: Normal rate, regular rhythm and normal heart sounds.  Pulmonary/Chest: Effort normal and breath sounds normal.  Abdominal: Soft. There is no tenderness.  Musculoskeletal:       Right hip: She exhibits tenderness. She exhibits normal range of motion and normal strength.       Left hip: Normal. She exhibits normal range of motion, normal strength and no tenderness.       Right knee: Normal.       Left knee: Normal.       Right ankle: Normal.       Left ankle: Normal.       Lumbar back: Normal.  She exhibits normal range of motion, no tenderness and no bony tenderness.       Legs: Neurological: She is alert.  Skin: Skin is warm and dry.  Psychiatric: She has a normal mood and affect.  Nursing note and vitals reviewed.    ED Treatments / Results  Labs (all labs ordered are listed, but only abnormal results are displayed) Labs Reviewed  CBC WITH DIFFERENTIAL/PLATELET - Abnormal; Notable for the following components:      Result Value   WBC 13.9 (*)    Neutro Abs 11.1 (*)    All other components within normal limits  BASIC METABOLIC PANEL - Abnormal; Notable for  the following components:   Chloride 99 (*)    Glucose, Bld 123 (*)    BUN 22 (*)    Creatinine, Ser 1.06 (*)    GFR calc non Af Amer 45 (*)    GFR calc Af Amer 52 (*)    All other components within normal limits  URINALYSIS, ROUTINE W REFLEX MICROSCOPIC - Abnormal; Notable for the following components:   Squamous Epithelial / LPF 0-5 (*)    All other components within normal limits  LIPASE, BLOOD  HEPATIC FUNCTION PANEL    EKG  EKG Interpretation None       Radiology Dg Hip Unilat  With Pelvis 2-3 Views Right  Result Date: 12/17/2017 CLINICAL DATA:  Right hip pain after tripping at 1500 hours today. EXAM: DG HIP (WITH OR WITHOUT PELVIS) 2-3V RIGHT COMPARISON:  None. FINDINGS: Acute right superior and inferior pubic rami fractures are identified with one shaft width displacement involving the superior pubic ramus fracture. Femoral heads are seated within their femoral components without fracture or joint dislocation. Mild degenerative joint space narrowing of both hips. There is mild lower lumbar degenerative disc disease with disc space flattening and endplate spurring. IMPRESSION: Acute right superior and inferior pubic rami fractures adjacent to the pubic symphysis, displaced involving the superior pubic ramus. No fracture of either hip. Electronically Signed   By: Ashley Royalty M.D.   On: 12/17/2017 22:53     Procedures Procedures (including critical care time)  Medications Ordered in ED Medications  morphine 4 MG/ML injection 4 mg (4 mg Intravenous Given 12/17/17 2340)  ondansetron (ZOFRAN) injection 4 mg (4 mg Intravenous Given 12/17/17 2340)     Initial Impression / Assessment and Plan / ED Course  I have reviewed the triage vital signs and the nursing notes.  Pertinent labs & imaging results that were available during my care of the patient were reviewed by me and considered in my medical decision making (see chart for details).     Patient seen and examined. Work-up initiated. Medications ordered.   Vital signs reviewed and are as follows: BP (!) 149/69 (BP Location: Left Arm)   Pulse 70   Temp 98.6 F (37 C) (Oral)   Resp 20   SpO2 96%   Discussed with Dr. Ellender Hose who will see. Plan admission for pain control and PT.   1:20 AM Patient resting comfortably after pain medication.  CT demonstrates only 2 fractures.  No occult hip fracture.  Patient updated on the plan.  Spoke with Dr. Hal Hope who will see patient.  Final Clinical Impressions(s) / ED Diagnoses   Final diagnoses:  Closed fracture of superior ramus of right pubis, initial encounter ([place])  Inferior pubic ramus fracture, right, closed, initial encounter ([place])   Admit for multiple pelvis fractures, difficulty with ambulation.  ED Discharge Orders    None       Carlisle Cater, Hershal Coria 12/18/17 0121    Duffy Bruce, MD 12/18/17 [phone]

## 2017-12-17 NOTE — ED Notes (Signed)
Xray attempted to call pt from lobby; no response.

## 2017-12-18 ENCOUNTER — Encounter (HOSPITAL_COMMUNITY): Payer: Self-pay | Admitting: Internal Medicine

## 2017-12-18 ENCOUNTER — Other Ambulatory Visit: Payer: Self-pay

## 2017-12-18 DIAGNOSIS — S32591A Other specified fracture of right pubis, initial encounter for closed fracture: Secondary | ICD-10-CM

## 2017-12-18 DIAGNOSIS — S329XXA Fracture of unspecified parts of lumbosacral spine and pelvis, initial encounter for closed fracture: Secondary | ICD-10-CM | POA: Diagnosis present

## 2017-12-18 DIAGNOSIS — N183 Chronic kidney disease, stage 3 unspecified: Secondary | ICD-10-CM

## 2017-12-18 DIAGNOSIS — Z9012 Acquired absence of left breast and nipple: Secondary | ICD-10-CM | POA: Diagnosis not present

## 2017-12-18 DIAGNOSIS — Z853 Personal history of malignant neoplasm of breast: Secondary | ICD-10-CM | POA: Diagnosis not present

## 2017-12-18 DIAGNOSIS — I10 Essential (primary) hypertension: Secondary | ICD-10-CM | POA: Diagnosis not present

## 2017-12-18 DIAGNOSIS — I129 Hypertensive chronic kidney disease with stage 1 through stage 4 chronic kidney disease, or unspecified chronic kidney disease: Secondary | ICD-10-CM | POA: Diagnosis not present

## 2017-12-18 DIAGNOSIS — S3993XA Unspecified injury of pelvis, initial encounter: Secondary | ICD-10-CM | POA: Diagnosis not present

## 2017-12-18 DIAGNOSIS — Z79899 Other long term (current) drug therapy: Secondary | ICD-10-CM | POA: Diagnosis not present

## 2017-12-18 DIAGNOSIS — S32599A Other specified fracture of unspecified pubis, initial encounter for closed fracture: Secondary | ICD-10-CM | POA: Diagnosis present

## 2017-12-18 DIAGNOSIS — Z8673 Personal history of transient ischemic attack (TIA), and cerebral infarction without residual deficits: Secondary | ICD-10-CM | POA: Diagnosis not present

## 2017-12-18 DIAGNOSIS — R269 Unspecified abnormalities of gait and mobility: Secondary | ICD-10-CM | POA: Diagnosis not present

## 2017-12-18 DIAGNOSIS — F419 Anxiety disorder, unspecified: Secondary | ICD-10-CM | POA: Diagnosis not present

## 2017-12-18 LAB — CBC WITH DIFFERENTIAL/PLATELET
BASOS ABS: 0 10*3/uL (ref 0.0–0.1)
Basophils Relative: 0 %
Eosinophils Absolute: 0 10*3/uL (ref 0.0–0.7)
Eosinophils Relative: 0 %
HCT: 40.2 % (ref 36.0–46.0)
Hemoglobin: 13.8 g/dL (ref 12.0–15.0)
LYMPHS PCT: 13 %
Lymphs Abs: 1.9 10*3/uL (ref 0.7–4.0)
MCH: 29.7 pg (ref 26.0–34.0)
MCHC: 34.3 g/dL (ref 30.0–36.0)
MCV: 86.5 fL (ref 78.0–100.0)
MONO ABS: 0.9 10*3/uL (ref 0.1–1.0)
MONOS PCT: 7 %
NEUTROS ABS: 11.1 10*3/uL — AB (ref 1.7–7.7)
Neutrophils Relative %: 80 %
Platelets: 251 10*3/uL (ref 150–400)
RBC: 4.65 MIL/uL (ref 3.87–5.11)
RDW: 13 % (ref 11.5–15.5)
WBC: 13.9 10*3/uL — ABNORMAL HIGH (ref 4.0–10.5)

## 2017-12-18 LAB — HEPATIC FUNCTION PANEL
ALBUMIN: 4 g/dL (ref 3.5–5.0)
ALK PHOS: 73 U/L (ref 38–126)
ALT: 30 U/L (ref 14–54)
AST: 40 U/L (ref 15–41)
BILIRUBIN TOTAL: 0.7 mg/dL (ref 0.3–1.2)
Bilirubin, Direct: 0.1 mg/dL (ref 0.1–0.5)
Indirect Bilirubin: 0.6 mg/dL (ref 0.3–0.9)
Total Protein: 7.2 g/dL (ref 6.5–8.1)

## 2017-12-18 LAB — BASIC METABOLIC PANEL
ANION GAP: 12 (ref 5–15)
Anion gap: 11 (ref 5–15)
BUN: 22 mg/dL — ABNORMAL HIGH (ref 6–20)
BUN: 22 mg/dL — ABNORMAL HIGH (ref 6–20)
CALCIUM: 9.9 mg/dL (ref 8.9–10.3)
CO2: 26 mmol/L (ref 22–32)
CO2: 29 mmol/L (ref 22–32)
CREATININE: 1.14 mg/dL — AB (ref 0.44–1.00)
Calcium: 9.4 mg/dL (ref 8.9–10.3)
Chloride: 100 mmol/L — ABNORMAL LOW (ref 101–111)
Chloride: 99 mmol/L — ABNORMAL LOW (ref 101–111)
Creatinine, Ser: 1.06 mg/dL — ABNORMAL HIGH (ref 0.44–1.00)
GFR calc Af Amer: 48 mL/min — ABNORMAL LOW (ref >60)
GFR calc Af Amer: 52 mL/min — ABNORMAL LOW (ref >60)
GFR calc non Af Amer: 41 mL/min — ABNORMAL LOW (ref >60)
GFR calc non Af Amer: 45 mL/min — ABNORMAL LOW (ref >60)
GLUCOSE: 136 mg/dL — AB (ref 65–99)
Glucose, Bld: 123 mg/dL — ABNORMAL HIGH (ref 65–99)
POTASSIUM: 3.5 mmol/L (ref 3.5–5.1)
Potassium: 3.7 mmol/L (ref 3.5–5.1)
Sodium: 137 mmol/L (ref 135–145)
Sodium: 140 mmol/L (ref 135–145)

## 2017-12-18 LAB — CBC
HCT: 38.4 % (ref 36.0–46.0)
Hemoglobin: 12.8 g/dL (ref 12.0–15.0)
MCH: 29.2 pg (ref 26.0–34.0)
MCHC: 33.3 g/dL (ref 30.0–36.0)
MCV: 87.7 fL (ref 78.0–100.0)
PLATELETS: 227 10*3/uL (ref 150–400)
RBC: 4.38 MIL/uL (ref 3.87–5.11)
RDW: 13.2 % (ref 11.5–15.5)
WBC: 9.3 10*3/uL (ref 4.0–10.5)

## 2017-12-18 LAB — LIPASE, BLOOD: Lipase: 50 U/L (ref 11–51)

## 2017-12-18 MED ORDER — HYDROCODONE-ACETAMINOPHEN 5-325 MG PO TABS: 1.0000 | ORAL_TABLET | Freq: Four times a day (QID) | ORAL | 0 refills | Status: DC | PRN

## 2017-12-18 MED ORDER — METHOCARBAMOL 500 MG PO TABS
500.0000 mg | ORAL_TABLET | Freq: Three times a day (TID) | ORAL | Status: DC | PRN
  Administered 2017-12-18 (×2): 500 mg via ORAL
  Filled 2017-12-18 (×2): qty 1

## 2017-12-18 MED ORDER — VITAMIN D 1000 UNITS PO TABS
2000.0000 [IU] | ORAL_TABLET | Freq: Every day | ORAL | Status: DC
  Administered 2017-12-18: 2000 [IU] via ORAL
  Filled 2017-12-18: qty 2

## 2017-12-18 MED ORDER — LIP MEDEX EX OINT
TOPICAL_OINTMENT | CUTANEOUS | Status: AC
  Administered 2017-12-18: 09:00:00
  Filled 2017-12-18: qty 7

## 2017-12-18 MED ORDER — ALPRAZOLAM 0.5 MG PO TABS
0.5000 mg | ORAL_TABLET | Freq: Every day | ORAL | Status: DC
  Administered 2017-12-18: 0.5 mg via ORAL
  Filled 2017-12-18: qty 1

## 2017-12-18 MED ORDER — ACETAMINOPHEN 325 MG PO TABS
650.0000 mg | ORAL_TABLET | Freq: Four times a day (QID) | ORAL | Status: DC | PRN
  Administered 2017-12-18 (×2): 650 mg via ORAL
  Filled 2017-12-18 (×2): qty 2

## 2017-12-18 MED ORDER — ENOXAPARIN SODIUM 40 MG/0.4ML ~~LOC~~ SOLN
40.0000 mg | SUBCUTANEOUS | Status: DC
  Administered 2017-12-18: 10:00:00 40 mg via SUBCUTANEOUS
  Filled 2017-12-18: qty 0.4

## 2017-12-18 MED ORDER — ACETAMINOPHEN 650 MG RE SUPP: 650.0000 mg | Freq: Four times a day (QID) | RECTAL | Status: DC | PRN

## 2017-12-18 MED ORDER — POTASSIUM CHLORIDE CRYS ER 20 MEQ PO TBCR: 20.0000 meq | EXTENDED_RELEASE_TABLET | Freq: Every day | ORAL | Status: DC

## 2017-12-18 MED ORDER — FUROSEMIDE 40 MG PO TABS: 40.0000 mg | ORAL_TABLET | Freq: Every day | ORAL | Status: DC

## 2017-12-18 MED ORDER — FELODIPINE ER 10 MG PO TB24
10.0000 mg | ORAL_TABLET | Freq: Every day | ORAL | Status: DC
  Administered 2017-12-18: 10 mg via ORAL
  Filled 2017-12-18: qty 1

## 2017-12-18 MED ORDER — ATENOLOL 50 MG PO TABS
50.0000 mg | ORAL_TABLET | Freq: Every day | ORAL | Status: DC
  Administered 2017-12-18: 50 mg via ORAL
  Filled 2017-12-18: qty 1

## 2017-12-18 MED ORDER — ONDANSETRON HCL 4 MG/2ML IJ SOLN: 4.0000 mg | Freq: Four times a day (QID) | INTRAMUSCULAR | Status: DC | PRN

## 2017-12-18 MED ORDER — AMITRIPTYLINE HCL 25 MG PO TABS
50.0000 mg | ORAL_TABLET | Freq: Every evening | ORAL | Status: DC | PRN
  Filled 2017-12-18: qty 2

## 2017-12-18 MED ORDER — MORPHINE SULFATE (PF) 2 MG/ML IV SOLN: 1.0000 mg | INTRAVENOUS | Status: DC | PRN

## 2017-12-18 MED ORDER — ONDANSETRON HCL 4 MG PO TABS: 4.0000 mg | ORAL_TABLET | Freq: Four times a day (QID) | ORAL | Status: DC | PRN

## 2017-12-18 NOTE — Evaluation (Signed)
Physical Therapy Evaluation Patient Details Name: [name] MRN: [id number] DOB: 1925-12-16 Today's Date: 12/18/2017   History of Present Illness  This 84 year old female fell and sustained a R superior and inferior comminuted pubic rami fx.  PMH:  HTN, and breast CA in remission  Clinical Impression  The patient  Requires assistance for bed mobility. Did not tolerate weight on Right leg using RW. The patient can benefit from post acute rehab. Patient would like to speak to family. Limited assistance in home. Pt admitted with above diagnosis. Pt currently with functional limitations due to the deficits listed below (see PT Problem List). Pt will benefit from skilled PT to increase their independence and safety with mobility to allow discharge to the venue listed below.  Med     Follow Up Recommendations SNF    Equipment Recommendations  None recommended by PT    Recommendations for Other Services       Precautions / Restrictions Precautions Precautions: Fall Restrictions Weight Bearing Restrictions: No Other Position/Activity Restrictions: WBAT      Mobility  Bed Mobility Overal bed mobility: Needs Assistance Bed Mobility: Supine to Sit     Supine to sit: Mod assist     General bed mobility comments: assist with right leg and trunk to sitting  Transfers Overall transfer level: Needs assistance Equipment used: Rolling walker (2 wheeled) Transfers: Sit to/from Stand Sit to Stand: Min assist         General transfer comment: assist to rise and stabilize. Cues for UE placement  Ambulation/Gait Ambulation/Gait assistance: Mod assist;+2 safety/equipment Ambulation Distance (Feet): 6 Feet Assistive device: Rolling walker (2 wheeled) Gait Pattern/deviations: Step-to pattern;Antalgic;Decreased step length - right;Decreased stance time - right     General Gait Details: very antalgic on the right, noted RLE to buckle with WB. Chir brought up.  Stairs             Wheelchair Mobility    Modified Rankin (Stroke Patients Only)       Balance                                             Pertinent Vitals/Pain Pain Assessment: Faces Faces Pain Scale: Hurts whole lot Pain Location: R pelvis with WB Pain Descriptors / Indicators: Aching Pain Intervention(s): Monitored during session;Limited activity within patient's tolerance;Premedicated before session    Home Living Family/patient expects to be discharged to:: Private residence Living Arrangements: Spouse/significant other Available Help at Discharge: Family Type of Home: House Home Access: Stairs to enter Entrance Stairs-Rails: None Entrance Stairs-Number of Steps: 3 Home Layout: Multi-level;Able to live on main level with bedroom/bathroom   Additional Comments: lives with extended family. Husband uses walker; son; daughter in law (dementia). Grandson lives downstairs and is a Insurance underwriter.  Pt has an extra walker at home but no BSC    Prior Function Level of Independence: Independent               Hand Dominance        Extremity/Trunk Assessment   Upper Extremity Assessment Upper Extremity Assessment: Defer to OT evaluation    Lower Extremity Assessment Lower Extremity Assessment: RLE deficits/detail RLE Deficits / Details: decreased weight tolerated on right, assist to move leg on bed    Cervical / Trunk Assessment Cervical / Trunk Assessment: Normal  Communication   Communication: No difficulties  Cognition  Arousal/Alertness: Awake/alert Behavior During Therapy: WFL for tasks assessed/performed Overall Cognitive Status: Within Functional Limits for tasks assessed                                        General Comments      Exercises     Assessment/Plan    PT Assessment Patient needs continued PT services  PT Problem List Decreased strength;Decreased range of motion;Decreased knowledge of use of DME;Decreased activity  tolerance;Decreased safety awareness;Pain;Decreased knowledge of precautions;Decreased mobility       PT Treatment Interventions DME instruction;Gait training;Stair training;Functional mobility training;Therapeutic activities    PT Goals (Current goals can be found in the Care Plan section)  Acute Rehab PT Goals Patient Stated Goal: return to independence; decreased pain PT Goal Formulation: With patient Time For Goal Achievement: 12/25/17 Potential to Achieve Goals: Good    Frequency Min 3X/week   Barriers to discharge Decreased caregiver support      Co-evaluation               AM-PAC PT "6 Clicks" Daily Activity  Outcome Measure Difficulty turning over in bed (including adjusting bedclothes, sheets and blankets)?: Unable Difficulty moving from lying on back to sitting on the side of the bed? : Unable Difficulty sitting down on and standing up from a chair with arms (e.g., wheelchair, bedside commode, etc,.)?: Unable Help needed moving to and from a bed to chair (including a wheelchair)?: Total Help needed walking in hospital room?: Total Help needed climbing 3-5 steps with a railing? : Total 6 Click Score: 6    End of Session Equipment Utilized During Treatment: Gait belt Activity Tolerance: Patient limited by pain Patient left: in chair;with call bell/phone within reach Nurse Communication: Mobility status PT Visit Diagnosis: Unsteadiness on feet (R26.81);Pain Pain - Right/Left: Right Pain - part of body: Leg    Time: 2951-8841 PT Time Calculation (min) (ACUTE ONLY): 20 min   Charges:   PT Evaluation $PT Eval Low Complexity: 1 Low     PT G CodesTresa Endo PT [phone]   Claretha Cooper 12/18/2017, 11:30 AM

## 2017-12-18 NOTE — Progress Notes (Addendum)
CSW met with the patient at bedside explain role and reason for visit, to assist with discharge to SNF recommended by PT.  Patient pleasantly declined SNF and reports she would like to discharge home with Homehealth. Patient reports she lives at home with her adult children and will have support from them. CSW informed RNCM of patient request.  [facility] signing off at this time.  Kathrin Greathouse, Latanya Presser, MSW Clinical Social Worker  [phone] 12/18/2017  2:37 PM

## 2017-12-18 NOTE — Progress Notes (Signed)
Patient's discharge instructions were explained and patient signed sheet and the signed sheet went home with the patient with the rest of the instructions unintentionally.

## 2017-12-18 NOTE — Discharge Summary (Addendum)
Physician Discharge Summary  [name] GYI:[id number] DOB: 1925/12/18 DOA: 12/17/2017  PCP: Crist Infante, MD  Admit date: 12/17/2017 Discharge date: 12/18/2017  Admitted From: home Disposition:  home    Home Health:  ordered  Equipment/Devices:  walker    Discharge Condition:  stable   CODE STATUS:  Full code  Consultations:  none    Discharge Diagnoses:  Principal Problem:   Closed fracture of multiple pubic rami ([place]) Active Problems:   Essential hypertension   ESOPHAGEAL STRICTURE   Cancer of upper-outer quadrant of female breast ([place])   Closed pelvic fracture (HCC)   CKD (chronic kidney disease) stage 3, GFR 30-59 ml/min (HCC)    Subjective: The patient states that she did not have any pain currently when she moved from the bed to the chair today.  Brief Summary: [name] is a 84 y.o. female with history of hypertension, esophageal strictures, breast cancer in remission and possible diastolic dysfunction was brought to the ER after patient had a fall and hurt her pelvic area.  Patient states she was walking on her porch and climbing down when she slipped and fell off.  Did not hit her head or lose consciousness.  She fell onto her buttock and hurt her pelvic area.  Xray and then a CT revealed  Right superior pubic and inferior pubic comminuted fractures  Hospital Course:   Right superior pubic and inferior pubic comminuted fractures - admitted for pain control and PT eval- PT recommends SNF  But she is declining to go and would rather go home where her family can take care of her- she agrees to home health - she only required once dose of Morphine yesterday and has not needed pain medication today   HTN - Atenolol  CKD3 - stable  Discharge Exam: Vitals:   12/18/17 0603 12/18/17 0958  BP: (!) 142/65 (!) 144/63  Pulse: 70 64  Resp: 16   Temp: 98.5 F (36.9 C)   SpO2: 99%    Vitals:   12/18/17 0200 12/18/17 0306 12/18/17 0603 12/18/17 0958  BP:  131/73 (!) 155/66 (!) 142/65 (!) 144/63  Pulse: 72 71 70 64  Resp:  18 16   Temp:  98.4 F (36.9 C) 98.5 F (36.9 C)   TempSrc:  Oral Oral   SpO2: (!) 89% 93% 99%     General: Pt is alert, awake, not in acute distress Cardiovascular: RRR, S1/S2 +, no rubs, no gallops Respiratory: CTA bilaterally, no wheezing, no rhonchi Abdominal: Soft, NT, ND, bowel sounds + Extremities: no edema, no cyanosis   Discharge Instructions  Discharge Instructions    Diet - low sodium heart healthy   Complete by:  As directed    Increase activity slowly   Complete by:  As directed      Allergies as of 12/18/2017      Reactions   Ibandronate Sodium    REACTION: joint aches   Latex    unsure   Sulfonamide Derivatives Hives   Tessalon [benzonatate]    Fast heart rate, kept pt awake      Medication List    STOP taking these medications   hydrocortisone 2.5 % rectal cream Commonly known as:  PROCTOSOL HC     TAKE these medications   ALPRAZolam 0.5 MG tablet Commonly known as:  XANAX Take 0.5 mg by mouth daily.   amitriptyline 50 MG tablet Commonly known as:  ELAVIL Take 50 mg by mouth at bedtime as needed for  sleep.   ATENOLOL PO Take 50 mg by mouth daily.   felodipine 10 MG 24 hr tablet Commonly known as:  PLENDIL Take 10 mg by mouth daily.   furosemide 40 MG tablet Commonly known as:  LASIX Take 40 mg by mouth daily.   HYDROcodone-acetaminophen 5-325 MG tablet Commonly known as:  NORCO/VICODIN Take 1 tablet by mouth every 6 (six) hours as needed for moderate pain.   potassium chloride SA 20 MEQ tablet Commonly known as:  K-DUR,KLOR-CON Take 20 mEq by mouth daily.   psyllium 28 % packet Commonly known as:  METAMUCIL SMOOTH TEXTURE Take 1 packet by mouth 2 (two) times daily.   Vitamin D3 2000 units Tabs Take 1 tablet by mouth daily.       Allergies  Allergen Reactions  . Ibandronate Sodium     REACTION: joint aches  . Latex     unsure  . Sulfonamide  Derivatives Hives  . Tessalon [Benzonatate]     Fast heart rate, kept pt awake     Procedures/Studies:    Dg Chest 2 View  Result Date: 12/18/2017 CLINICAL DATA:  Preoperative chest radiograph. EXAM: CHEST - 2 VIEW COMPARISON:  Chest radiograph performed 06/26/2011, and CT of the chest performed 10/17/2015 FINDINGS: The lungs are well-aerated and clear. Hilar prominence appears to reflect normal vasculature, on correlation with the prior CT. There is no evidence of focal opacification, pleural effusion or pneumothorax. The heart is borderline normal in size. No acute osseous abnormalities are seen. IMPRESSION: No acute cardiopulmonary process seen. Electronically Signed   By: Garald Balding M.D.   On: 12/18/2017 00:34   Ct Head Wo Contrast  Result Date: 12/18/2017 CLINICAL DATA:  Tripped while going up steps on porch, with concern for head injury. EXAM: CT HEAD WITHOUT CONTRAST TECHNIQUE: Contiguous axial images were obtained from the base of the skull through the vertex without intravenous contrast. COMPARISON:  None. FINDINGS: Brain: No evidence of acute infarction, hemorrhage, hydrocephalus, extra-axial collection or mass lesion / mass effect. Prominence of the ventricles and sulci reflects mild to moderate cortical volume loss. Cerebellar atrophy is noted. Scattered periventricular and subcortical white matter change likely reflects small vessel ischemic microangiopathy. A chronic lacunar infarct is noted at the left basal ganglia. The brainstem and fourth ventricle are within normal limits. The basal ganglia are unremarkable in appearance. The cerebral hemispheres demonstrate grossly normal gray-white differentiation. No mass effect or midline shift is seen. Vascular: No hyperdense vessel or unexpected calcification. Skull: There is no evidence of fracture; visualized osseous structures are unremarkable in appearance. Sinuses/Orbits: The visualized portions of the orbits are within normal limits.  The paranasal sinuses and mastoid air cells are well-aerated. Other: No significant soft Anderson abnormalities are seen. IMPRESSION: 1. No evidence of traumatic intracranial injury or fracture. 2. Mild to moderate cortical volume loss and scattered small vessel ischemic microangiopathy. 3. Chronic lacunar infarct at the left basal ganglia. Electronically Signed   By: Garald Balding M.D.   On: 12/18/2017 00:50   Ct Pelvis Wo Contrast  Result Date: 12/18/2017 CLINICAL DATA:  Pelvic trauma.  Fall.  Right hip pain. EXAM: CT PELVIS WITHOUT CONTRAST TECHNIQUE: Multidetector CT imaging of the pelvis was performed following the standard protocol without intravenous contrast. COMPARISON:  Pelvic radiograph 12/17/2017 FINDINGS: Urinary Tract:  Distended urinary bladder. Bowel:  Unremarkable visualized pelvic bowel loops. Vascular/Lymphatic: No pathologically enlarged lymph nodes. No significant vascular abnormality seen. Reproductive:  Status post hysterectomy. Other:  None. Musculoskeletal: There are comminuted  fractures of the right superior and inferior pubic rami. Pubic symphysis remains normally articulated. Sacroiliac joints are approximated. No hip fracture. Incidentally noted left superior pubic ramus bone island. Lower lumbar degenerative disc disease and facet hypertrophy. IMPRESSION: Comminuted fractures of the right superior and inferior pubic rami. No other pelvic or hip fracture. Electronically Signed   By: Ulyses Jarred M.D.   On: 12/18/2017 00:49   Dg Hip Unilat  With Pelvis 2-3 Views Right  Result Date: 12/17/2017 CLINICAL DATA:  Right hip pain after tripping at 1500 hours today. EXAM: DG HIP (WITH OR WITHOUT PELVIS) 2-3V RIGHT COMPARISON:  None. FINDINGS: Acute right superior and inferior pubic rami fractures are identified with one shaft width displacement involving the superior pubic ramus fracture. Femoral heads are seated within their femoral components without fracture or joint dislocation. Mild  degenerative joint space narrowing of both hips. There is mild lower lumbar degenerative disc disease with disc space flattening and endplate spurring. IMPRESSION: Acute right superior and inferior pubic rami fractures adjacent to the pubic symphysis, displaced involving the superior pubic ramus. No fracture of either hip. Electronically Signed   By: Ashley Royalty M.D.   On: 12/17/2017 22:53     The results of significant diagnostics from this hospitalization (including imaging, microbiology, ancillary and laboratory) are listed below for reference.     Microbiology: No results found for this or any previous visit (from the past 240 hour(s)).   Labs: BNP (last 3 results) No results for input(s): BNP in the last 8760 hours. Basic Metabolic Panel: Recent Labs  Lab 12/18/17 0010 12/18/17 0609  NA 137 140  K 3.5 3.7  CL 99* 100*  CO2 26 29  GLUCOSE 123* 136*  BUN 22* 22*  CREATININE 1.06* 1.14*  CALCIUM 9.9 9.4   Liver Function Tests: Recent Labs  Lab 12/18/17 0010  AST 40  ALT 30  ALKPHOS 73  BILITOT 0.7  PROT 7.2  ALBUMIN 4.0   Recent Labs  Lab 12/18/17 0010  LIPASE 50   No results for input(s): AMMONIA in the last 168 hours. CBC: Recent Labs  Lab 12/18/17 0010 12/18/17 0609  WBC 13.9* 9.3  NEUTROABS 11.1*  --   HGB 13.8 12.8  HCT 40.2 38.4  MCV 86.5 87.7  PLT 251 227   Cardiac Enzymes: No results for input(s): CKTOTAL, CKMB, CKMBINDEX, TROPONINI in the last 168 hours. BNP: Invalid input(s): POCBNP CBG: No results for input(s): GLUCAP in the last 168 hours. D-Dimer No results for input(s): DDIMER in the last 72 hours. Hgb A1c No results for input(s): HGBA1C in the last 72 hours. Lipid Profile No results for input(s): CHOL, HDL, LDLCALC, TRIG, CHOLHDL, LDLDIRECT in the last 72 hours. Thyroid function studies No results for input(s): TSH, T4TOTAL, T3FREE, THYROIDAB in the last 72 hours.  Invalid input(s): FREET3 Anemia work up No results for input(s):  VITAMINB12, FOLATE, FERRITIN, TIBC, IRON, RETICCTPCT in the last 72 hours. Urinalysis    Component Value Date/Time   COLORURINE YELLOW 12/17/2017 2336   APPEARANCEUR CLEAR 12/17/2017 2336   LABSPEC 1.008 12/17/2017 2336   PHURINE 7.0 12/17/2017 2336   GLUCOSEU NEGATIVE 12/17/2017 2336   HGBUR NEGATIVE 12/17/2017 2336   BILIRUBINUR NEGATIVE 12/17/2017 2336   KETONESUR NEGATIVE 12/17/2017 2336   PROTEINUR NEGATIVE 12/17/2017 2336   UROBILINOGEN 0.2 07/03/2011 0817   NITRITE NEGATIVE 12/17/2017 2336   LEUKOCYTESUR NEGATIVE 12/17/2017 2336   Sepsis Labs Invalid input(s): PROCALCITONIN,  WBC,  LACTICIDVEN Microbiology No results found for  this or any previous visit (from the past 240 hour(s)).   Time coordinating discharge: Over 30 minutes  SIGNED:   Debbe Odea, MD  [facility] 12/18/2017, 2:22 PM Pager   If 7PM-7AM, please contact night-coverage [email or web address] Password [email or web address]

## 2017-12-18 NOTE — Evaluation (Signed)
Occupational Therapy Evaluation Patient Details Name: [name] MRN: [id number] DOB: 1926/05/28 Today's Date: 12/18/2017    History of Present Illness This 84 year old female fell and sustained a R superior and inferior comminuted pubic rami fx.  PMH:  HTN, and breast CA in remission   Clinical Impression   Pt was admitted for the above.  She was able to get up and take a few steps, but she needs +2 assistance for safety.  Pt can perform ADLs with mod A, +1 assistance. She is very motivated but was limited by pain with weight bearing. Will follow in acute setting with min guard to min A level goals    Follow Up Recommendations  SNF(if pt returns home with 24/7, rec HHOT)    Equipment Recommendations  3 in 1 bedside commode    Recommendations for Other Services       Precautions / Restrictions Precautions Precautions: Fall Restrictions Weight Bearing Restrictions: No Other Position/Activity Restrictions: WBAT      Mobility Bed Mobility Overal bed mobility: Needs Assistance Bed Mobility: Supine to Sit     Supine to sit: Mod assist        Transfers Overall transfer level: Needs assistance Equipment used: Rolling walker (2 wheeled) Transfers: Sit to/from Stand Sit to Stand: Min assist         General transfer comment: assist to rise and stabilize. Cues for UE placement    Balance                                           ADL either performed or assessed with clinical judgement   ADL Overall ADL's : Needs assistance/impaired Eating/Feeding: Independent   Grooming: Set up;Sitting   Upper Body Bathing: Set up;Sitting   Lower Body Bathing: Moderate assistance;Sit to/from stand   Upper Body Dressing : Set up;Sitting   Lower Body Dressing: Moderate assistance;Sit to/from stand   Toilet Transfer: Minimal assistance;+2 for physical assistance;Ambulation;RW(chair)   Toileting- Clothing Manipulation and Hygiene: Moderate assistance;Sit  to/from stand         General ADL Comments: Pt able to take a few steps; no pain at rest but increases lot when weight bearing.     Vision         Perception     Praxis      Pertinent Vitals/Pain Pain Assessment: Faces Faces Pain Scale: Hurts whole lot Pain Location: R pelvis with WB Pain Descriptors / Indicators: Aching Pain Intervention(s): Limited activity within patient's tolerance;Monitored during session;Premedicated before session;Repositioned;Ice applied     Hand Dominance     Extremity/Trunk Assessment Upper Extremity Assessment Upper Extremity Assessment: Generalized weakness(grossly 4-/5)           Communication Communication Communication: No difficulties   Cognition Arousal/Alertness: Awake/alert Behavior During Therapy: WFL for tasks assessed/performed Overall Cognitive Status: Within Functional Limits for tasks assessed                                     General Comments       Exercises     Shoulder Instructions      Home Living Family/patient expects to be discharged to:: Unsure Living Arrangements: Spouse/significant other  Additional Comments: lives with extended family. Husband uses walker; son; daughter in law (dementia). Grandson lives downstairs and is a Insurance underwriter.  Pt has an extra walker at home but no BSC      Prior Functioning/Environment Level of Independence: Independent                 OT Problem List: Decreased strength;Decreased activity tolerance;Impaired balance (sitting and/or standing);Pain;Decreased knowledge of use of DME or AE      OT Treatment/Interventions: Self-care/ADL training;Energy conservation;DME and/or AE instruction;Balance training;Patient/family education    OT Goals(Current goals can be found in the care plan section) Acute Rehab OT Goals Patient Stated Goal: return to independence; decreased pain OT Goal Formulation: With patient Time  For Goal Achievement: 01/01/18 Potential to Achieve Goals: Good ADL Goals Pt Will Transfer to Toilet: with min guard assist;ambulating;bedside commode;stand pivot transfer(vs) Pt Will Perform Toileting - Clothing Manipulation and hygiene: with min guard assist;sit to/from stand Additional ADL Goal #1: pt will perform ADL with min A, sit to stand Additional ADL Goal #2: pt will perform bed mobility at min A in preparation for adls from flat bed  OT Frequency: Min 2X/week   Barriers to D/C:            Co-evaluation              AM-PAC PT "6 Clicks" Daily Activity     Outcome Measure Help from another person eating meals?: None Help from another person taking care of personal grooming?: A Little Help from another person toileting, which includes using toliet, bedpan, or urinal?: A Lot Help from another person bathing (including washing, rinsing, drying)?: A Lot Help from another person to put on and taking off regular upper body clothing?: A Little Help from another person to put on and taking off regular lower body clothing?: A Lot 6 Click Score: 16   End of Session Nurse Communication: Mobility status  Activity Tolerance: Patient limited by pain Patient left: in chair;with call bell/phone within reach;with chair alarm set  OT Visit Diagnosis: Unsteadiness on feet (R26.81);Pain Pain - Right/Left: Right Pain - part of body: Hip                Time: 5188-4166 OT Time Calculation (min): 20 min Charges:  OT General Charges $OT Visit: 1 Visit OT Evaluation $OT Eval Low Complexity: 1 Low G-Codes:     [place], OTR/L [phone] 12/18/2017  [name] 12/18/2017, 10:59 AM

## 2017-12-18 NOTE — H&P (Signed)
History and Physical    [name] EVO:[id number] DOB: 07-Dec-1925 DOA: 12/17/2017  PCP: Crist Infante, MD  Patient coming from: Home.  Chief Complaint: Fall.  HPI: [name] is a 84 y.o. female with history of hypertension, esophageal strictures, breast cancer in remission and possible diastolic dysfunction was brought to the ER after patient had a fall and hurt her pelvic area.  Patient states she was walking on her porch and climbing down when she slipped and fell off.  Did not hit her head or lose consciousness.  She fell onto her buttock and hurt her pelvic area.  She scooted onto the ER by area and waited for her son.  Once her son arrived she was brought to the ER because of the pain.  ED Course: CT pelvis revealed right superior pubic and inferior pubic comminuted fracture.  Patient admitted for further pain management and possible rehab placement.  Patient denies any chest pain palpitations shortness of breath.  Review of Systems: As per HPI, rest all negative.   Past Medical History:  Diagnosis Date  . Anxiety   . Arthritis   . Breast cancer ([place])    left  . Cough   . Esophageal stricture   . External hemorrhoids   . GERD (gastroesophageal reflux disease)   . Hyperlipidemia   . Hypertension   . Thrombosed external hemorrhoid, R posterior 01/20/2012  . TIA (transient ischemic attack)     Past Surgical History:  Procedure Laterality Date  . ABDOMINAL HYSTERECTOMY    . BREAST LUMPECTOMY  2010   left  . CHOLECYSTECTOMY  2002  . CYSTECTOMY  in her 28's  . HEEL SPUR SURGERY     right  . MASTECTOMY, PARTIAL     left  . TONSILLECTOMY       reports that  has never smoked. she has never used smokeless tobacco. She reports that she does not drink alcohol or use drugs.  Allergies  Allergen Reactions  . Ibandronate Sodium     REACTION: joint aches  . Latex     unsure  . Sulfonamide Derivatives Hives  . Tessalon [Benzonatate]     Fast heart rate, kept pt  awake    Family History  Problem Relation Age of Onset  . Lung cancer Brother   . Cervical cancer Sister   . Hypertension Father   . Colon cancer Neg Hx   . Esophageal cancer Neg Hx   . Rectal cancer Neg Hx   . Stomach cancer Neg Hx     Prior to Admission medications   Medication Sig Start Date End Date Taking? Authorizing Provider  ALPRAZolam Duanne Moron) 0.5 MG tablet Take 0.5 mg by mouth daily.    Yes [provider]  amitriptyline (ELAVIL) 50 MG tablet Take 50 mg by mouth at bedtime as needed for sleep.  02/02/14  Yes [provider]  ATENOLOL PO Take 50 mg by mouth daily.    Yes [provider]  Cholecalciferol (VITAMIN D3) 2000 UNITS TABS Take 1 tablet by mouth daily.    Yes [provider]  felodipine (PLENDIL) 10 MG 24 hr tablet Take 10 mg by mouth daily.  06/06/11  Yes [provider]  furosemide (LASIX) 40 MG tablet Take 40 mg by mouth daily.  04/05/11  Yes [provider]  potassium chloride SA (K-DUR,KLOR-CON) 20 MEQ tablet Take 20 mEq by mouth daily.  05/07/11  Yes [provider]  HYDROcodone-acetaminophen (NORCO/VICODIN) 5-325 MG tablet  Take 1 tablet by mouth every 6 (six) hours as needed for moderate pain. Patient not taking: Reported on 12/18/2017 07/24/15   Dalia Heading, PA-C  hydrocortisone (PROCTOSOL HC) 2.5 % rectal cream Place rectally 2 (two) times daily. Patient not taking: Reported on 12/18/2017 02/16/14   Stark Klein, MD  psyllium (METAMUCIL SMOOTH TEXTURE) 28 % packet Take 1 packet by mouth 2 (two) times daily. Patient not taking: Reported on 12/18/2017 02/16/14   Stark Klein, MD    Physical Exam: Vitals:   12/17/17 2158 12/18/17 0121 12/18/17 0130 12/18/17 0200  BP: (!) 149/69 (!) 146/84 132/67 131/73  Pulse: 70 67 70 72  Resp: 20     Temp: 98.6 F (37 C)     TempSrc: Oral     SpO2: 96% (!) 88% (!) 88% (!) 89%      Constitutional: Moderately built and nourished. Vitals:   12/17/17  2158 12/18/17 0121 12/18/17 0130 12/18/17 0200  BP: (!) 149/69 (!) 146/84 132/67 131/73  Pulse: 70 67 70 72  Resp: 20     Temp: 98.6 F (37 C)     TempSrc: Oral     SpO2: 96% (!) 88% (!) 88% (!) 89%   Eyes: Anicteric no pallor. ENMT: No discharge from the ears eyes nose or mouth. Neck: No mass felt.  No neck rigidity. Respiratory: No rhonchi or crepitations. Cardiovascular: S1-S2 heard no murmurs appreciated. Abdomen: Soft nontender bowel sounds present. Musculoskeletal: Pain on moving right hip. Skin: No rash. Neurologic: Alert awake oriented to time place and person.  Moves all extremities. Psychiatric: Appears normal.  Normal affect.   Labs on Admission: I have personally reviewed following labs and imaging studies  CBC: Recent Labs  Lab 12/18/17 0010  WBC 13.9*  NEUTROABS 11.1*  HGB 13.8  HCT 40.2  MCV 86.5  PLT 510   Basic Metabolic Panel: Recent Labs  Lab 12/18/17 0010  NA 137  K 3.5  CL 99*  CO2 26  GLUCOSE 123*  BUN 22*  CREATININE 1.06*  CALCIUM 9.9   GFR: CrCl cannot be calculated (Unknown ideal weight.). Liver Function Tests: Recent Labs  Lab 12/18/17 0010  AST 40  ALT 30  ALKPHOS 73  BILITOT 0.7  PROT 7.2  ALBUMIN 4.0   Recent Labs  Lab 12/18/17 0010  LIPASE 50   No results for input(s): AMMONIA in the last 168 hours. Coagulation Profile: No results for input(s): INR, PROTIME in the last 168 hours. Cardiac Enzymes: No results for input(s): CKTOTAL, CKMB, CKMBINDEX, TROPONINI in the last 168 hours. BNP (last 3 results) No results for input(s): PROBNP in the last 8760 hours. HbA1C: No results for input(s): HGBA1C in the last 72 hours. CBG: No results for input(s): GLUCAP in the last 168 hours. Lipid Profile: No results for input(s): CHOL, HDL, LDLCALC, TRIG, CHOLHDL, LDLDIRECT in the last 72 hours. Thyroid Function Tests: No results for input(s): TSH, T4TOTAL, FREET4, T3FREE, THYROIDAB in the last 72 hours. Anemia Panel: No  results for input(s): VITAMINB12, FOLATE, FERRITIN, TIBC, IRON, RETICCTPCT in the last 72 hours. Urine analysis:    Component Value Date/Time   COLORURINE YELLOW 12/17/2017 2336   APPEARANCEUR CLEAR 12/17/2017 2336   LABSPEC 1.008 12/17/2017 2336   PHURINE 7.0 12/17/2017 2336   GLUCOSEU NEGATIVE 12/17/2017 2336   HGBUR NEGATIVE 12/17/2017 2336   BILIRUBINUR NEGATIVE 12/17/2017 2336   KETONESUR NEGATIVE 12/17/2017 2336   PROTEINUR NEGATIVE 12/17/2017 2336   UROBILINOGEN 0.2 07/03/2011 0817   NITRITE NEGATIVE 12/17/2017  2336   LEUKOCYTESUR NEGATIVE 12/17/2017 2336   Sepsis Labs: @LABRCNTIP (procalcitonin:4,lacticidven:4) )No results found for this or any previous visit (from the past 240 hour(s)).   Radiological Exams on Admission: Dg Chest 2 View  Result Date: 12/18/2017 CLINICAL DATA:  Preoperative chest radiograph. EXAM: CHEST - 2 VIEW COMPARISON:  Chest radiograph performed 06/26/2011, and CT of the chest performed 10/17/2015 FINDINGS: The lungs are well-aerated and clear. Hilar prominence appears to reflect normal vasculature, on correlation with the prior CT. There is no evidence of focal opacification, pleural effusion or pneumothorax. The heart is borderline normal in size. No acute osseous abnormalities are seen. IMPRESSION: No acute cardiopulmonary process seen. Electronically Signed   By: Garald Balding M.D.   On: 12/18/2017 00:34   Ct Head Wo Contrast  Result Date: 12/18/2017 CLINICAL DATA:  Tripped while going up steps on porch, with concern for head injury. EXAM: CT HEAD WITHOUT CONTRAST TECHNIQUE: Contiguous axial images were obtained from the base of the skull through the vertex without intravenous contrast. COMPARISON:  None. FINDINGS: Brain: No evidence of acute infarction, hemorrhage, hydrocephalus, extra-axial collection or mass lesion / mass effect. Prominence of the ventricles and sulci reflects mild to moderate cortical volume loss. Cerebellar atrophy is noted.  Scattered periventricular and subcortical white matter change likely reflects small vessel ischemic microangiopathy. A chronic lacunar infarct is noted at the left basal ganglia. The brainstem and fourth ventricle are within normal limits. The basal ganglia are unremarkable in appearance. The cerebral hemispheres demonstrate grossly normal gray-white differentiation. No mass effect or midline shift is seen. Vascular: No hyperdense vessel or unexpected calcification. Skull: There is no evidence of fracture; visualized osseous structures are unremarkable in appearance. Sinuses/Orbits: The visualized portions of the orbits are within normal limits. The paranasal sinuses and mastoid air cells are well-aerated. Other: No significant soft tissue abnormalities are seen. IMPRESSION: 1. No evidence of traumatic intracranial injury or fracture. 2. Mild to moderate cortical volume loss and scattered small vessel ischemic microangiopathy. 3. Chronic lacunar infarct at the left basal ganglia. Electronically Signed   By: Garald Balding M.D.   On: 12/18/2017 00:50   Ct Pelvis Wo Contrast  Result Date: 12/18/2017 CLINICAL DATA:  Pelvic trauma.  Fall.  Right hip pain. EXAM: CT PELVIS WITHOUT CONTRAST TECHNIQUE: Multidetector CT imaging of the pelvis was performed following the standard protocol without intravenous contrast. COMPARISON:  Pelvic radiograph 12/17/2017 FINDINGS: Urinary Tract:  Distended urinary bladder. Bowel:  Unremarkable visualized pelvic bowel loops. Vascular/Lymphatic: No pathologically enlarged lymph nodes. No significant vascular abnormality seen. Reproductive:  Status post hysterectomy. Other:  None. Musculoskeletal: There are comminuted fractures of the right superior and inferior pubic rami. Pubic symphysis remains normally articulated. Sacroiliac joints are approximated. No hip fracture. Incidentally noted left superior pubic ramus bone island. Lower lumbar degenerative disc disease and facet  hypertrophy. IMPRESSION: Comminuted fractures of the right superior and inferior pubic rami. No other pelvic or hip fracture. Electronically Signed   By: Ulyses Jarred M.D.   On: 12/18/2017 00:49   Dg Hip Unilat  With Pelvis 2-3 Views Right  Result Date: 12/17/2017 CLINICAL DATA:  Right hip pain after tripping at 1500 hours today. EXAM: DG HIP (WITH OR WITHOUT PELVIS) 2-3V RIGHT COMPARISON:  None. FINDINGS: Acute right superior and inferior pubic rami fractures are identified with one shaft width displacement involving the superior pubic ramus fracture. Femoral heads are seated within their femoral components without fracture or joint dislocation. Mild degenerative joint space narrowing of both hips.  There is mild lower lumbar degenerative disc disease with disc space flattening and endplate spurring. IMPRESSION: Acute right superior and inferior pubic rami fractures adjacent to the pubic symphysis, displaced involving the superior pubic ramus. No fracture of either hip. Electronically Signed   By: Ashley Royalty M.D.   On: 12/17/2017 22:53     Assessment/Plan Principal Problem:   Closed fracture of multiple pubic rami (HCC) Active Problems:   Essential hypertension   ESOPHAGEAL STRICTURE   Cancer of upper-outer quadrant of female breast ([place])   Pelvic fracture (HCC)   Closed pelvic fracture ([place])    1. Comminuted fracture of the right superior pubic rami and inferior pubic rami after mechanical fall -patient is placed on morphine and Robaxin for pain management.  We will get physical therapy consult.  Patient probably will need rehab placement.  May discuss with orthopedic surgeon in the morning. 2. Hypertension on atenolol and Plendil. 3. Possible diastolic dysfunction on Lasix and potassium which will be continued. 4. History of breast cancer in remission. 5. History of esophageal strictures.  EKG pending.   DVT prophylaxis: Lovenox. Code Status: Full code. Family Communication: No  family at the bedside. Disposition Plan: To be determined. Consults called: Physical therapy. Admission status: Observation.   Rise Patience MD [facility] Pager [phone].  If 7PM-7AM, please contact night-coverage [email or web address] Password [facility]  12/18/2017, 2:48 AM

## 2017-12-18 NOTE — Progress Notes (Signed)
Pt declined Home Health. Pt states I will be alright once I get home. Explained to pt that she was Observation.

## 2017-12-24 ENCOUNTER — Other Ambulatory Visit: Payer: Self-pay

## 2017-12-24 ENCOUNTER — Emergency Department (HOSPITAL_COMMUNITY): Payer: PPO

## 2017-12-24 ENCOUNTER — Encounter (HOSPITAL_COMMUNITY): Payer: Self-pay | Admitting: *Deleted

## 2017-12-24 ENCOUNTER — Emergency Department (HOSPITAL_COMMUNITY)
Admission: EM | Admit: 2017-12-24 | Discharge: 2017-12-25 | Disposition: A | Discharge status: Skilled Nursing Facility | Payer: PPO | Attending: Emergency Medicine | Admitting: Emergency Medicine

## 2017-12-24 DIAGNOSIS — S32511A Fracture of superior rim of right pubis, initial encounter for closed fracture: Secondary | ICD-10-CM | POA: Diagnosis not present

## 2017-12-24 DIAGNOSIS — N183 Chronic kidney disease, stage 3 (moderate): Secondary | ICD-10-CM | POA: Insufficient documentation

## 2017-12-24 DIAGNOSIS — S069X9A Unspecified intracranial injury with loss of consciousness of unspecified duration, initial encounter: Secondary | ICD-10-CM | POA: Diagnosis not present

## 2017-12-24 DIAGNOSIS — G8911 Acute pain due to trauma: Secondary | ICD-10-CM | POA: Diagnosis not present

## 2017-12-24 DIAGNOSIS — Z79899 Other long term (current) drug therapy: Secondary | ICD-10-CM | POA: Insufficient documentation

## 2017-12-24 DIAGNOSIS — R52 Pain, unspecified: Secondary | ICD-10-CM

## 2017-12-24 DIAGNOSIS — Z91040 Latex allergy status: Secondary | ICD-10-CM | POA: Insufficient documentation

## 2017-12-24 DIAGNOSIS — T148XXA Other injury of unspecified body region, initial encounter: Secondary | ICD-10-CM | POA: Diagnosis not present

## 2017-12-24 DIAGNOSIS — M545 Low back pain: Secondary | ICD-10-CM | POA: Diagnosis not present

## 2017-12-24 DIAGNOSIS — R296 Repeated falls: Secondary | ICD-10-CM | POA: Insufficient documentation

## 2017-12-24 DIAGNOSIS — S32810D Multiple fractures of pelvis with stable disruption of pelvic ring, subsequent encounter for fracture with routine healing: Secondary | ICD-10-CM | POA: Insufficient documentation

## 2017-12-24 DIAGNOSIS — W1830XD Fall on same level, unspecified, subsequent encounter: Secondary | ICD-10-CM | POA: Diagnosis not present

## 2017-12-24 DIAGNOSIS — N3000 Acute cystitis without hematuria: Secondary | ICD-10-CM | POA: Insufficient documentation

## 2017-12-24 DIAGNOSIS — M25559 Pain in unspecified hip: Secondary | ICD-10-CM | POA: Diagnosis not present

## 2017-12-24 DIAGNOSIS — W19XXXA Unspecified fall, initial encounter: Secondary | ICD-10-CM

## 2017-12-24 DIAGNOSIS — Z853 Personal history of malignant neoplasm of breast: Secondary | ICD-10-CM | POA: Diagnosis not present

## 2017-12-24 LAB — URINALYSIS, ROUTINE W REFLEX MICROSCOPIC
Bilirubin Urine: NEGATIVE
Glucose, UA: NEGATIVE mg/dL
Ketones, ur: NEGATIVE mg/dL
Nitrite: POSITIVE — AB
PH: 6 (ref 5.0–8.0)
Protein, ur: NEGATIVE mg/dL
SPECIFIC GRAVITY, URINE: 1.012 (ref 1.005–1.030)

## 2017-12-24 MED ORDER — VITAMIN D3 25 MCG (1000 UNIT) PO TABS
2000.0000 [IU] | ORAL_TABLET | Freq: Every day | ORAL | Status: DC
  Administered 2017-12-25: 2000 [IU] via ORAL
  Filled 2017-12-24: qty 2

## 2017-12-24 MED ORDER — CEPHALEXIN 500 MG PO CAPS
500.0000 mg | ORAL_CAPSULE | Freq: Once | ORAL | Status: AC
  Administered 2017-12-24: 500 mg via ORAL
  Filled 2017-12-24: qty 1

## 2017-12-24 MED ORDER — AMITRIPTYLINE HCL 25 MG PO TABS: 50.0000 mg | ORAL_TABLET | Freq: Every evening | ORAL | Status: DC | PRN

## 2017-12-24 MED ORDER — CEPHALEXIN 250 MG PO CAPS
250.0000 mg | ORAL_CAPSULE | Freq: Three times a day (TID) | ORAL | Status: DC
  Administered 2017-12-24: 250 mg via ORAL
  Filled 2017-12-24: qty 1

## 2017-12-24 MED ORDER — ATENOLOL 50 MG PO TABS
50.0000 mg | ORAL_TABLET | Freq: Every day | ORAL | Status: DC
  Administered 2017-12-25: 50 mg via ORAL
  Filled 2017-12-24: qty 1

## 2017-12-24 MED ORDER — POTASSIUM CHLORIDE CRYS ER 20 MEQ PO TBCR
20.0000 meq | EXTENDED_RELEASE_TABLET | Freq: Every day | ORAL | Status: DC
  Administered 2017-12-24 – 2017-12-25 (×2): 20 meq via ORAL
  Filled 2017-12-24 (×2): qty 1

## 2017-12-24 MED ORDER — FELODIPINE ER 10 MG PO TB24
10.0000 mg | ORAL_TABLET | Freq: Every day | ORAL | Status: DC
  Administered 2017-12-25: 10 mg via ORAL
  Filled 2017-12-24: qty 1

## 2017-12-24 MED ORDER — FUROSEMIDE 40 MG PO TABS
40.0000 mg | ORAL_TABLET | Freq: Every day | ORAL | Status: DC
  Administered 2017-12-24 – 2017-12-25 (×2): 40 mg via ORAL
  Filled 2017-12-24 (×2): qty 1

## 2017-12-24 NOTE — ED Triage Notes (Signed)
EMS states Pt fell on the 13th, pelvic fx noted, pt unable to take pain meds due to hallucinations. She was supposed to go to rehab facility according to husband. Pt A&O x 4  133/[id number]%

## 2017-12-24 NOTE — Progress Notes (Addendum)
CSW created FL-2 and EPD will sign shortly.  CSW will continue to follow for D/C needs.  FL-2 signed, CSW requested EPD place a PT consult for possible SNF placement.  Pt's son Emelia Sandoval at ph: [phone] states preferences for pt are: 1. [facility] ([place]) -Lattie Haw in admission at ph: [phone] 2. [facility] (CSW spoke to [place] she needs insurance auth to proceed) Nikki at ph: [phone] 3. [facility]: CSW spoke to [place] she is aware of pt and awaiting info, CSW texted Suanne Marker update 10:58pm on 3/20 to ph: [phone]   [facility] is not actually a pick of pt's son but CSW found they take [facility] and have an opening.  Pt's son has been informed by CSW that pt will have to go to first placement CSW Dept finds for pt but pt can then transfer once placed.  Pt's son states [facility] told him pt will get 20 days and then possible 100 days but Market researcher at [facility] told our Medical Director 7 days and CSW cautioned pt's son against having false hope.  CSW has faxed pt out.  Once pt chooses a bed or placement is picked by [place] then CSW on-duty can call [facility] at ph: [phone] and speak to  or [phone] (on call).  CSW called and left a VM for Lattie Haw with [facility] stating we (CSW DEPT) will call with a facility name for the auth to proceed on 12/24/17.   Alphonse Guild. Lorenso Quirino, LCSW, LCAS, CSI Clinical Social Worker Ph: [phone]

## 2017-12-24 NOTE — ED Notes (Signed)
Patient denies pain and is resting comfortably.  

## 2017-12-24 NOTE — Clinical Social Work Note (Signed)
Clinical Social Work Assessment  Patient Details  Name: [name] MRN: [id number] Date of Birth: [birth year]  Date of referral:  12/24/17               Reason for consult:  Facility Placement                Permission sought to share information with:  Chartered certified accountant granted to share information::  Yes, Verbal Permission Granted  Name::        Agency::     Relationship::     Contact Information:     Housing/Transportation Living arrangements for the past 2 months:  Single Family Home Source of Information:  Adult Children Patient Interpreter Needed:  None Criminal Activity/Legal Involvement Pertinent to Current Situation/Hospitalization:    Significant Relationships:  Adult Children Lives with:  Adult Children Do you feel safe going back to the place where you live?  No Need for family participation in patient care:  Yes (Comment)  Care giving concerns:  FL-2 signed, CSW requested EPD place a PT consult for possible SNF placement.  Pt's son Deavion Strider at ph: [phone] states preferences for pt are: 1. [facility] ([place]) 2. [facility] (CSW spoke to [place] she needs insurance auth to proceed) 3. [facility]: CSW spoke to [place] she is aware of pt and awaiting info, CSW texted [place] update 10:58pm on 3/20. [facility] is not actually a pick of pt's son but CSW found they take [facility] and have an opening.  Pt's son has been informed by CSW that pt will have to go to first placement CSW Dept finds for pt but pt can then transfer once placed.  Pt's son states [facility] told him pt will get 20 days and then possible 100 days but Market researcher at [facility] told our Medical Director 7 days and CSW cautioned pt's son against having false hope.  CSW has faxed pt out.      Social Worker assessment / plan:  CSW met with pt and pt's son and confirmed pt's and pt's son's plan to be discharged to SNF to for rehab at discharge.  CSW  provided active listening and validated pt's son's concerns.   CSW DEPT was given permission and will complete FL-2 and send referrals out to SNF facilities via the hub per pt's request.  Pt has been living independently with her son, prior to being admitted to [facility].   Employment status:  Retired Nurse, adult PT Recommendations:  Not assessed at this time Information / Referral to community resources:     Patient/Family's Response to care:  Patient semi-alert and oriented.  Patient and pt's son agreeable to plan.  Pt's son supportive and strongly involved in pt.'s care.  Pt.'s son pleasant and appreciated CSW intervention.   Patient/Family's Understanding of and Emotional Response to Diagnosis, Current Treatment, and Prognosis:  Still assessing  Emotional Assessment Appearance:  Appears stated age Attitude/Demeanor/Rapport:  Unable to Assess Affect (typically observed):  Unable to Assess Orientation:  Fluctuating Orientation (Suspected and/or reported Sundowners) Alcohol / Substance use:    Psych involvement (Current and /or in the community):     Discharge Needs  Concerns to be addressed:  No discharge needs identified Readmission within the last 30 days:  Yes Current discharge risk:  None Barriers to Discharge:  No Barriers Identified   Claudine Mouton, LCSWA 12/24/2017, 11:04 PM

## 2017-12-24 NOTE — Progress Notes (Signed)
This encounter was created in error - please disregard.

## 2017-12-24 NOTE — Progress Notes (Signed)
CSW spoke to [name] and [name] (14th?) CSW offered SNF and CM offered [name] and pt declined.    CSW will continue to follow for D/C needs.  [name]. Erickson Yamashiro, LCSW, LCAS, CSI Clinical Social Worker Ph: [phone]

## 2017-12-24 NOTE — Progress Notes (Signed)
CSW received PASSR from [facility] MUST: [id number]  CSW will continue to follow for D/C needs.  Alphonse Guild. Gary Gabrielsen, LCSW, LCAS, CSI Clinical Social Worker Ph: [phone]

## 2017-12-24 NOTE — NC FL2 (Signed)
[place] MEDICAID FL2 LEVEL OF CARE SCREENING TOOL     IDENTIFICATION  Patient Name: [name] Birthdate: January 12, 1926 Sex: female Admission Date (Current Location): 12/24/2017  [facility] and [place] Number:  Herbalist and Address:  [facility],  [place] [address], [place]      Provider Number: [id number]  Attending Physician Name and Address:  Blanchie Dessert, MD  Relative Name and Phone Number:       Current Level of Care: Hospital Recommended Level of Care: [place] Prior Approval Number:    Date Approved/Denied: 12/24/17 PASRR Number: [id number]  Discharge Plan: SNF    Current Diagnoses: Patient Active Problem List   Diagnosis Date Noted  . Closed pelvic fracture ([place]) 12/18/2017  . Closed fracture of multiple pubic rami ([place]) 12/18/2017  . CKD (chronic kidney disease) stage 3, GFR 30-59 ml/min (HCC) 12/18/2017  . Internal hemorrhoids with complication with suspected prolapse 02/16/2014  . Cancer of upper-outer quadrant of female breast ([place]) 06/16/2012  . Hemorrhoids, internal, thrombosed, Left lateral 02/10/2012  . CONSTIPATION 04/18/2008  . HYPERLIPIDEMIA 04/15/2008  . Essential hypertension 04/15/2008  . TRANSIENT ISCHEMIC ATTACK 04/15/2008  . ESOPHAGEAL STRICTURE 04/15/2008  . GERD 04/15/2008  . ARTHRITIS 04/15/2008    Orientation RESPIRATION BLADDER Height & Weight     Self, Time, Place  Normal Incontinent Weight:   Height:     BEHAVIORAL SYMPTOMS/MOOD NEUROLOGICAL BOWEL NUTRITION STATUS      Continent    AMBULATORY STATUS COMMUNICATION OF NEEDS Skin   Extensive Assist Verbally Normal                       Personal Care Assistance Level of Assistance  Bathing, Dressing Bathing Assistance: Limited assistance Feeding assistance: Independent Dressing Assistance: Limited assistance     Functional Limitations Info             SPECIAL CARE FACTORS FREQUENCY                      Contractures Contractures Info: Not present    Additional Factors Info  Code Status, Allergies Code Status Info: Prior Allergies Info: Ibandronate Sodium, Latex, Sulfonamide Derivatives, Tessalon Benzonatate           Current Medications (12/24/2017):  This is the current hospital active medication list Current Facility-Administered Medications  Medication Dose Route Frequency Provider Last Rate Last Dose  . amitriptyline (ELAVIL) tablet 50 mg  50 mg Oral QHS PRN Blanchie Dessert, MD      . Derrill Memo ON 12/25/2017] atenolol (TENORMIN) tablet 50 mg  50 mg Oral Daily Plunkett, Whitney, MD      . cephALEXin (KEFLEX) capsule 250 mg  250 mg Oral Q8H Plunkett, Loree Fee, MD      . Derrill Memo ON 12/25/2017] cholecalciferol (VITAMIN D) tablet 2,000 Units  2,000 Units Oral Daily Blanchie Dessert, MD      . Derrill Memo ON 12/25/2017] felodipine (PLENDIL) 24 hr tablet 10 mg  10 mg Oral Daily Plunkett, Whitney, MD      . furosemide (LASIX) tablet 40 mg  40 mg Oral Daily Blanchie Dessert, MD   40 mg at 12/24/17 1901  . potassium chloride SA (K-DUR,KLOR-CON) CR tablet 20 mEq  20 mEq Oral Daily Blanchie Dessert, MD   20 mEq at 12/24/17 1901   Current Outpatient Medications  Medication Sig Dispense Refill  . ALPRAZolam (XANAX) 0.5 MG tablet Take 0.5 mg by mouth 2 (two) times daily as  needed for anxiety.     Marland Kitchen amitriptyline (ELAVIL) 50 MG tablet Take 50 mg by mouth at bedtime as needed for sleep.     . ATENOLOL PO Take 50 mg by mouth daily.     . Cholecalciferol (VITAMIN D3) 2000 UNITS TABS Take 1 tablet by mouth daily.     . felodipine (PLENDIL) 10 MG 24 hr tablet Take 10 mg by mouth daily.     . furosemide (LASIX) 40 MG tablet Take 40 mg by mouth daily.     Marland Kitchen HYDROcodone-acetaminophen (NORCO/VICODIN) 5-325 MG tablet Take 1 tablet by mouth every 6 (six) hours as needed for moderate pain. 15 tablet 0  . potassium chloride SA (K-DUR,KLOR-CON) 20 MEQ tablet Take 20 mEq by mouth daily.     . psyllium (METAMUCIL  SMOOTH TEXTURE) 28 % packet Take 1 packet by mouth 2 (two) times daily. (Patient not taking: Reported on 12/18/2017) 60 packet 3     Discharge Medications: Please see discharge summary for a list of discharge medications.  Relevant Imaging Results:  Relevant Lab Results:   Additional Information [id number]  Alphonse Guild Berna Gitto, LCSWA

## 2017-12-24 NOTE — ED Notes (Signed)
Bed: WA21 Expected date:  Expected time:  Means of arrival:  Comments: EMS/known pelvic fx

## 2017-12-24 NOTE — Progress Notes (Addendum)
CSW staffed pts' case with CSW Asst Director who suggested [place] consult Market researcher.  Per Medical Director, medical director spoke to Dr. Amalia Hailey Medical Director at [facility], pt must understand that pt will only pt will only get approx 7 days in the SNF that accepts pt and that pt's family must understand that a plan needs to be in place for the pt at the end of the 7 days.  6:13 PM CSW spoke to pt's son Jazz Biddy at ph: [phone] who is the primary contact.  Pt's son was informed by the CSW that pt's Psychologist, occupational stated to the [facility] Director that pt would receive 7 days.  Pt's son says first choice for SNF is [facility] [place] and 2nd choice is [facility] and CSW spoke to [place] with pt's son's permission and [facility] bed [facility] on [facility]. CSW spoke to [place] at [facility] that they need a insurance auth to move forward.  CSW called [facility]'s on-call nurse Renee who checked the system to see if pt's son began the authorization process for the pt.  On-call nurse stated an Josem Kaufmann is in process and that [facility] is awaiting the name of a facility to be authorize.  Renee also stated and that Crystal from [facility] is awaiting on some additional information (including name of a facility) and clarity to finish the auth process. Renee stated she will reach out to Crystal and find out what Crystal needs to finish the auth and call the (this Probation officer) CSW back.  6:42 PM Renee on-call nurse from [facility] called back and stated a facility name would have to be chosen before auth process cold move forward.  CSW updated the CSW Asst Director.  CSW will continue to follow for D/C needs.  Alphonse Guild. Nguyen Todorov, LCSW, LCAS, CSI Clinical Social Worker Ph: [phone]        Alphonse Guild. Ruffin Lada, LCSW, LCAS, CSI Clinical Social Worker Ph: [phone]

## 2017-12-24 NOTE — ED Provider Notes (Signed)
[facility] DEPT Provider Note   CSN: [id number] Arrival date & time: 12/24/17  1439     History   Chief Complaint No chief complaint on file.   HPI ZETHA KUHAR is a 84 y.o. female.  Patient is an 84 year old female with a history of CKD, breast cancer, hypertension, hyperlipidemia, TIA, recent fall with a closed right pelvic fracture hospitalized this past week and refused rehab or home health returning today due to pelvic pain, inability to stand and difficulty getting around her home.  Patient states prior to the injury she could ambulate without a walker or cane.  Patient's fracture is nonoperative.  Pain medicine was attempted however it caused hallucinations.  She states now she cannot get out of bed and stand without her son holding her.  She is not in any significant pain with sitting hits all with weightbearing.  Upon weightbearing her pain is 9 out of 10 and sharp in nature.  She denies any other complaints at this time.   5:00 PM Patient's son arrives and gives more history.  Patient's son states that after she was admitted the next day when he arrived they said she was ready to go home.  He was not aware that his mom refused rehab or home health.  For the next 6 days after being home patient was delirious and hallucinating.  Son states she was combative, seeing things, refusing to sleep.  Finally on Saturday he stopped using the hydrocodone.  Yesterday was the first day she seemed to be back to her normal self.  She is still slightly groggy but he states she is much more mentally coherent now.  He states over the last 6 days since being home she has had 2 additional falls.  Both were in the night when she was attempting to go to the bathroom.  He does not think she lost consciousness and is unaware if she hit her head or not.  She is also been complaining of some urinary retention and frequency.  Currently she is complaining that she needs to urinate  but son states she has not urinated since noon today.   The history is provided by the patient and a relative.    Past Medical History:  Diagnosis Date  . Anxiety   . Arthritis   . Breast cancer ([place])    left  . Cough   . Esophageal stricture   . External hemorrhoids   . GERD (gastroesophageal reflux disease)   . Hyperlipidemia   . Hypertension   . Thrombosed external hemorrhoid, R posterior 01/20/2012  . TIA (transient ischemic attack)     Patient Active Problem List   Diagnosis Date Noted  . Closed pelvic fracture ([place]) 12/18/2017  . Closed fracture of multiple pubic rami ([place]) 12/18/2017  . CKD (chronic kidney disease) stage 3, GFR 30-59 ml/min ([facility]) 12/18/2017  . Internal hemorrhoids with complication with suspected prolapse 02/16/2014  . Cancer of upper-outer quadrant of female breast ([place]) 06/16/2012  . Hemorrhoids, internal, thrombosed, Left lateral 02/10/2012  . CONSTIPATION 04/18/2008  . HYPERLIPIDEMIA 04/15/2008  . Essential hypertension 04/15/2008  . TRANSIENT ISCHEMIC ATTACK 04/15/2008  . ESOPHAGEAL STRICTURE 04/15/2008  . GERD 04/15/2008  . ARTHRITIS 04/15/2008    Past Surgical History:  Procedure Laterality Date  . ABDOMINAL HYSTERECTOMY    . BREAST LUMPECTOMY  2010   left  . CHOLECYSTECTOMY  2002  . CYSTECTOMY  in her 47's  . HEEL SPUR SURGERY  right  . MASTECTOMY, PARTIAL     left  . TONSILLECTOMY      OB History    No data available       Home Medications    Prior to Admission medications   Medication Sig Start Date End Date Taking? Authorizing Provider  ALPRAZolam Duanne Moron) 0.5 MG tablet Take 0.5 mg by mouth daily.     [provider]  amitriptyline (ELAVIL) 50 MG tablet Take 50 mg by mouth at bedtime as needed for sleep.  02/02/14   [provider]  ATENOLOL PO Take 50 mg by mouth daily.     [provider]  Cholecalciferol (VITAMIN D3) 2000 UNITS TABS Take 1 tablet by mouth daily.     [provider]  felodipine (PLENDIL) 10 MG 24 hr tablet Take 10 mg by mouth daily.  06/06/11   [provider]  furosemide (LASIX) 40 MG tablet Take 40 mg by mouth daily.  04/05/11   [provider]  HYDROcodone-acetaminophen (NORCO/VICODIN) 5-325 MG tablet Take 1 tablet by mouth every 6 (six) hours as needed for moderate pain. 12/18/17   Debbe Odea, MD  potassium chloride SA (K-DUR,KLOR-CON) 20 MEQ tablet Take 20 mEq by mouth daily.  05/07/11   [provider]  psyllium (METAMUCIL SMOOTH TEXTURE) 28 % packet Take 1 packet by mouth 2 (two) times daily. Patient not taking: Reported on 12/18/2017 02/16/14   Stark Klein, MD    Family History Family History  Problem Relation Age of Onset  . Lung cancer Brother   . Cervical cancer Sister   . Hypertension Father   . Colon cancer Neg Hx   . Esophageal cancer Neg Hx   . Rectal cancer Neg Hx   . Stomach cancer Neg Hx     Social History Social History   Tobacco Use  . Smoking status: Never Smoker  . Smokeless tobacco: Never Used  Substance Use Topics  . Alcohol use: No  . Drug use: No     Allergies   Ibandronate sodium; Latex; Sulfonamide derivatives; and Tessalon [benzonatate]   Review of Systems Review of Systems  All other systems reviewed and are negative.    Physical Exam Updated Vital Signs BP (!) 132/56 (BP Location: Right Arm)   Pulse (!) 46   Temp 97.8 F (36.6 C) (Oral)   Resp 20   SpO2 99%   Physical Exam  Constitutional: She is oriented to person, place, and time. She appears well-developed and well-nourished. No distress.  HENT:  Head: Normocephalic and atraumatic.  Mouth/Throat: Oropharynx is clear and moist.  Eyes: EOM are normal. Pupils are equal, round, and reactive to light.  Neck: Normal range of motion. Neck supple. No spinous process tenderness and no muscular tenderness present. No neck rigidity. Normal range of motion present.  Cardiovascular: Normal rate, regular rhythm, normal  heart sounds and intact distal pulses.  No murmur heard. Pulmonary/Chest: Effort normal and breath sounds normal. She has no wheezes. She has no rales.  Abdominal: Soft. She exhibits no distension. There is no tenderness. There is no CVA tenderness.  Musculoskeletal: She exhibits tenderness.       Lumbar back: She exhibits decreased range of motion and pain. She exhibits no swelling, no deformity and normal pulse.  Pain with right hip flexion.  Large healing ecchymosis over the right medial thigh.  Pulses intact in feet.  Sensation intact  Neurological: She is alert and oriented to person, place, and time. Coordination normal.  Reflex Scores:      Patellar reflexes are 1+ on the right side and 1+ on the left side. Skin: Skin is warm and dry. No rash noted.  Psychiatric: She has a normal mood and affect.  Nursing note and vitals reviewed.    ED Treatments / Results  Labs (all labs ordered are listed, but only abnormal results are displayed) Labs Reviewed  URINALYSIS, ROUTINE W REFLEX MICROSCOPIC - Abnormal; Notable for the following components:      Result Value   APPearance HAZY (*)    Hgb urine dipstick SMALL (*)    Nitrite POSITIVE (*)    Leukocytes, UA LARGE (*)    Bacteria, UA MANY (*)    Squamous Epithelial / LPF 0-5 (*)    All other components within normal limits  URINE CULTURE    EKG  EKG Interpretation None       Radiology Ct Head Wo Contrast  Result Date: 12/24/2017 CLINICAL DATA:  Fall with altered LOC EXAM: CT HEAD WITHOUT CONTRAST TECHNIQUE: Contiguous axial images were obtained from the base of the skull through the vertex without intravenous contrast. COMPARISON:  CT brain 12/17/2017 FINDINGS: Brain: No acute territorial infarction, hemorrhage or intracranial mass is visualized. Old lacunar infarct in the left basal ganglia. Moderate atrophy and small vessel ischemic changes of the white matter. Stable ventricle size. Vascular: No hyperdense vessels.  Carotid  vascular calcification. Skull: No depressed skull fracture Sinuses/Orbits: No acute finding. Other: None IMPRESSION: 1. No CT evidence for acute intracranial abnormality. 2. Atrophy and small vessel ischemic changes of the white matter. Electronically Signed   By: Donavan Foil M.D.   On: 12/24/2017 17:30   Dg Hip Unilat With Pelvis 2-3 Views Right  Result Date: 12/24/2017 CLINICAL DATA:  Fall with pelvic fracture EXAM: DG HIP (WITH OR WITHOUT PELVIS) 2-3V RIGHT COMPARISON:  12/17/2017 FINDINGS: Right superior and inferior pubic rami fractures with increased displacement of the right superior pubic ramus fracture. Some periosteal reaction suggested. Pubic symphysis is intact. Both femoral heads project in joint. IMPRESSION: Right superior and inferior pubic rami fractures are again demonstrated, with interval increase in displacement of the superior pubic ramus fracture since prior study. Electronically Signed   By: Donavan Foil M.D.   On: 12/24/2017 17:34    Procedures Procedures (including critical care time)  Medications Ordered in ED Medications - No data to display   Initial Impression / Assessment and Plan / ED Course  I have reviewed the triage vital signs and the nursing notes.  Pertinent labs & imaging results that were available during my care of the patient were reviewed by me and considered in my medical decision making (see chart for details).     Elderly female returning here today for not doing well at home after having a right pelvic fracture.  Patient was hospitalized in PT recommended physical therapy and rehab however patient refused at that time home health or rehab.  Now here due to not thriving at home.  Patient also is unable to take pain medication due to causing hallucinations.  She currently has no new complaints that would require further workup.  She is well-appearing in no distress.  Vital signs are reassuring.  When family arrives will confirm there are no further  issues.  Social work has been contacted and will start looking for rehab placement.   5:01 PM After getting further history from the patient's son will re-x-ray the hip to ensure no additional fractures.  We  will also CT the head given she has had 2 falls without known injury.  Also patient by bladder scan has greater than 1000 and only was able to urinate a little bit out.  Patient will most likely need a Foley catheter.  Assuming imaging is okay patient is appropriate for rehab placement which the son is requesting.  5:41 PM Head CT is negative for acute findings.  Imaging of the hip just shows more displacement but no new fractures.  Also UA is consistent with a urinary tract infection which is most likely why patient is having the symptoms of retention.  Urine culture was sent and patient was treated with Keflex.  6:47 PM Patient's home meds were ordered.  She will will continue to get the antibiotic.  Social work has seen the patient is currently looking for placement. Final Clinical Impressions(s) / ED Diagnoses   Final diagnoses:  Pain  Fall  Acute cystitis without hematuria  Multiple closed fractures of pelvis with stable disruption of pelvic ring with routine healing, subsequent encounter    ED Discharge Orders    None       Blanchie Dessert, MD 12/28/17 [phone]

## 2017-12-25 ENCOUNTER — Non-Acute Institutional Stay (SKILLED_NURSING_FACILITY): Payer: PPO | Admitting: Internal Medicine

## 2017-12-25 ENCOUNTER — Other Ambulatory Visit: Payer: Self-pay | Admitting: *Deleted

## 2017-12-25 ENCOUNTER — Encounter: Payer: Self-pay | Admitting: Internal Medicine

## 2017-12-25 DIAGNOSIS — Z853 Personal history of malignant neoplasm of breast: Secondary | ICD-10-CM | POA: Diagnosis not present

## 2017-12-25 DIAGNOSIS — M6281 Muscle weakness (generalized): Secondary | ICD-10-CM | POA: Diagnosis not present

## 2017-12-25 DIAGNOSIS — Z9181 History of falling: Secondary | ICD-10-CM | POA: Diagnosis not present

## 2017-12-25 DIAGNOSIS — S32591D Other specified fracture of right pubis, subsequent encounter for fracture with routine healing: Secondary | ICD-10-CM | POA: Diagnosis not present

## 2017-12-25 DIAGNOSIS — S3993XA Unspecified injury of pelvis, initial encounter: Secondary | ICD-10-CM | POA: Diagnosis not present

## 2017-12-25 DIAGNOSIS — R488 Other symbolic dysfunctions: Secondary | ICD-10-CM | POA: Diagnosis not present

## 2017-12-25 DIAGNOSIS — R8290 Unspecified abnormal findings in urine: Secondary | ICD-10-CM

## 2017-12-25 DIAGNOSIS — K219 Gastro-esophageal reflux disease without esophagitis: Secondary | ICD-10-CM | POA: Diagnosis not present

## 2017-12-25 DIAGNOSIS — E785 Hyperlipidemia, unspecified: Secondary | ICD-10-CM | POA: Diagnosis not present

## 2017-12-25 DIAGNOSIS — S32591A Other specified fracture of right pubis, initial encounter for closed fracture: Secondary | ICD-10-CM | POA: Diagnosis not present

## 2017-12-25 DIAGNOSIS — I129 Hypertensive chronic kidney disease with stage 1 through stage 4 chronic kidney disease, or unspecified chronic kidney disease: Secondary | ICD-10-CM | POA: Diagnosis not present

## 2017-12-25 DIAGNOSIS — R262 Difficulty in walking, not elsewhere classified: Secondary | ICD-10-CM | POA: Diagnosis not present

## 2017-12-25 DIAGNOSIS — T50905D Adverse effect of unspecified drugs, medicaments and biological substances, subsequent encounter: Secondary | ICD-10-CM | POA: Diagnosis not present

## 2017-12-25 DIAGNOSIS — N183 Chronic kidney disease, stage 3 (moderate): Secondary | ICD-10-CM | POA: Diagnosis not present

## 2017-12-25 DIAGNOSIS — K222 Esophageal obstruction: Secondary | ICD-10-CM

## 2017-12-25 DIAGNOSIS — E876 Hypokalemia: Secondary | ICD-10-CM | POA: Diagnosis not present

## 2017-12-25 DIAGNOSIS — K648 Other hemorrhoids: Secondary | ICD-10-CM | POA: Diagnosis not present

## 2017-12-25 DIAGNOSIS — I10 Essential (primary) hypertension: Secondary | ICD-10-CM | POA: Diagnosis not present

## 2017-12-25 DIAGNOSIS — G8911 Acute pain due to trauma: Secondary | ICD-10-CM | POA: Diagnosis not present

## 2017-12-25 DIAGNOSIS — T50905A Adverse effect of unspecified drugs, medicaments and biological substances, initial encounter: Secondary | ICD-10-CM | POA: Insufficient documentation

## 2017-12-25 DIAGNOSIS — K644 Residual hemorrhoidal skin tags: Secondary | ICD-10-CM | POA: Diagnosis not present

## 2017-12-25 DIAGNOSIS — F419 Anxiety disorder, unspecified: Secondary | ICD-10-CM | POA: Diagnosis not present

## 2017-12-25 DIAGNOSIS — M1990 Unspecified osteoarthritis, unspecified site: Secondary | ICD-10-CM | POA: Diagnosis not present

## 2017-12-25 DIAGNOSIS — N390 Urinary tract infection, site not specified: Secondary | ICD-10-CM | POA: Diagnosis not present

## 2017-12-25 DIAGNOSIS — S32511D Fracture of superior rim of right pubis, subsequent encounter for fracture with routine healing: Secondary | ICD-10-CM | POA: Diagnosis not present

## 2017-12-25 DIAGNOSIS — Z8673 Personal history of transient ischemic attack (TIA), and cerebral infarction without residual deficits: Secondary | ICD-10-CM | POA: Diagnosis not present

## 2017-12-25 MED ORDER — CEPHALEXIN 500 MG PO CAPS: 500.0000 mg | ORAL_CAPSULE | Freq: Four times a day (QID) | ORAL | 0 refills | Status: DC

## 2017-12-25 NOTE — Consult Note (Addendum)
   [facility] CM Inpatient Consult   12/25/2017  SULEIKA DONAVAN [birth year] [id number]    Referral received from [facility]TA for [facility] follow up while at [facility].  Patient discharged from [facility] ED to [facility]. She discharged prior to hospital liaison engagement.   Will make referral to [facility] LCSW follow up per HTA request.   Made [facility] office aware of above.   [name], MSN-Ed, RN,BSN [facility] Liaison [phone]

## 2017-12-25 NOTE — ED Provider Notes (Signed)
  Physical Exam  BP (!) 148/62   Pulse (!) 53   Temp 98.6 F (37 C) (Oral)   Resp 16   SpO2 95%   Physical Exam  ED Course/Procedures     Procedures  MDM  Patient has been accepted at [facility].       Davonna Belling, MD 12/25/17 1015

## 2017-12-25 NOTE — Patient Instructions (Signed)
See assessment and plan under each diagnosis in the problem list and acutely for this visit 

## 2017-12-25 NOTE — Assessment & Plan Note (Addendum)
Tylenol as needed with tramadol if pain is uncontrolled NSAIDS not option with GI hx

## 2017-12-25 NOTE — Progress Notes (Signed)
NURSING HOME LOCATION:  [facility] ROOM NUMBER:  [id number]  CODE STATUS:  Full  PCP: Crist Infante, MD  [address] [place] [address]  This is a comprehensive admission note to [facility] performed on this date less than 30 days from date of admission. Included are preadmission medical/surgical history;reconciled medication list; family history; social history and comprehensive review of systems.  Corrections and additions to the records were documented. Comprehensive physical exam was also performed. Additionally a clinical summary was entered for each active diagnosis pertinent to this admission in the Problem List to enhance continuity of care.  HPI: The patient was in the ED 3/13 after falling 3 steps to the ground. Her son actually has a camera outside the home and the event was filmed. She had shaken out a mop and climbed back up 3 steps when she lost her balance. She projected herself toward the grass falling on her right side. He states that she has unstable gait and fell last year & fractured her wrist.. There is no history of neuro or cardiac associated prodrome with falls. The patient was unable to walk after the fall. Hip film revealed acute right superior and inferior pubic rami fractures with displacement involving the superior pubic ramus. This was felt to be nonoperative in nature. She was admitted but discharged the next day with narcotics as at that time she refused rehabilitation or home health care. She had required only 1 dose of morphine in the ED and had not taken narcotics or other pain medication the day of discharge. She was discharged with hydrocodone/acetaminophen 5/325 every 6 hours as needed. Apparently the narcotic pain medicines caused visual hallucinations & she could not get out of bed or stand without her son assisting her. Upon weightbearing her pain was 9 out of 10. The son states that he had been unaware his mother had refused rehabilitation  or home health. He stated that over the next several days after being home she was delirious and hallucinating even having conversations with dead relatives. She was also combative ,refusing to sleep. He stated that on 3/16 he stopped the hydrocodone and there was significant improvement in her encephalopathy. He stated that as of 3/19 she seemed to have returned to her baseline mental status. He states she remained slightly groggy but much more coherent. Also at home she had 2 additional falls while attempting to ambulate to the bathroom. Additionally she was complaining of urinary retention and frequency. Urinalysis did reveal nitrates and white cells too numerous to count. Culture reveals E coli; sensitivities pending. She was placed on Keflex pending final results.  Past medical and surgical history: Includes history of hypertension, TIA, esophageal strictures, breast cancer in remission, and possible diastolic dysfunction  Past surgeries include partial mastectomy, cystectomy ( patient unable to define extent or location of procedure) hysterectomy and cholecystectomy  Social history: Nonsmoker; nondrinker  Family history: Reviewed. A sister had dementia and 2 brothers died with dementia.   Review of systems:  Could not be completed due to dementia. She was unable to give the date or name the president. According to her son she is "forgetful".  Physical exam:  Pertinent or positive findings: There appears to have asymmetry of the supraorbital structures. The left side appears lower than the right and associated with slight ptosis. Teeth are in excellent shape. She has a grade 1 systolic murmur at the base. Foley catheter in place. Urine is clear. Pedal pulses are surprisingly strong. She has  scattered scarring ,vitiligo,& bruising over the upper extremities. Strength was not tested in the lower extremities due to the fractures.  General appearance: Adequately nourished; no acute distress,  increased work of breathing is present.   Lymphatic: No lymphadenopathy about the head, neck, axilla. Eyes: No conjunctival inflammation or lid edema is present. There is no scleral icterus. Ears:  External ear exam shows no significant lesions or deformities.   Nose:  External nasal examination shows no deformity or inflammation. Nasal mucosa are pink and moist without lesions, exudates Oral exam: Lips and gums are healthy appearing.There is no oropharyngeal erythema or exudate. Neck:  No thyromegaly, masses, tenderness noted.    Heart:  Normal rate and regular rhythm. S1 and S2 normal without gallop, click, rub .  Lungs: Chest clear to auscultation without wheezes, rhonchi, rales, rubs. Abdomen: Bowel sounds are normal.  Abdomen is soft and nontender with no organomegaly, hernias, masses. GU: Deferred  Extremities:  No cyanosis, clubbing, edema  Neurologic exam:  Strength equal  in upper extremities Balance, Rhomberg, finger to nose testing could not be completed due to clinical state Deep tendon reflexes are equal Skin: Warm & dry w/o tenting. No significant rash.  See clinical summary under each active problem in the Problem List with associated updated therapeutic plan

## 2017-12-25 NOTE — Assessment & Plan Note (Signed)
Tramadol as needed if Tylenol is ineffective in controlling pain

## 2017-12-25 NOTE — Progress Notes (Addendum)
10:10am- CSW spoke with Vivien Rota at [facility] and was informed that they can take pt today. CSW to call later once Josem Kaufmann has been received from [facility]TA. CSW has updated RN, MD and family of transportation. RN to call [phone]. CSW has set up transport for 11am. There are no further CSW needs at this time. CSW signing off.    9:39am- CSW spoke with Vivien Rota he needs an auth number in order to take pt. CSW has reached out to [employer] and HTA for this at this time.   9:37am- CSW spoke [employer] and was informed that pt has a 7 day auth with HTA. CSW reached out to [place] with [facility] about taking pt. CSW was informd that Vivien Rota has to [facility] first.  CSW aware that pt is holding for placement. CSW is awaiting PT evaluation in order to start auth as HTA does not began auth until PT evaluation is in. CSW has spoken with Erline Levine at [facility] ED and informed her of this at this time. CSW will continue to follow for need.    Virgie Dad Brittannie Tawney, MSW, [facility] Clinical Social Worker [phone]

## 2017-12-25 NOTE — Evaluation (Addendum)
Physical Therapy Evaluation Patient Details Name: [name] MRN: [id number] DOB: [birth year] Today's Date: 12/25/2017   History of Present Illness  This 84 year old female fell and sustained a R superior and inferior comminuted pubic rami fx and came to ED 12/17/17. DC to home 3/14. . Brought back after additional falls and hallucinations. AMS.  PMH:  HTN, and breast CA in remission. Back to ED 3/20 with inability to be cared for at home.  Clinical Impression         The patient is known to this writer from admission last week following pelvic fracture. The patient presents with pain in the right hip and inner thigh. Bruising noted inner right thigh. Also note multiple scratches on the left thigh and hip  That patient reports not knowing how those were obtained. The patient does appear to have some memory deficits, Also very HOH. No family present.  Noted increased displaced right  pubic fracture since last admit.   No indication of  Any weight bearing limitations since fracture has changed since last week. Patient was WBAT last admission. Pt admitted with above diagnosis. Pt currently with functional limitations due to the deficits listed below (see PT Problem List).  Pt will benefit from skilled PT to increase their independence and safety with mobility to allow discharge to the venue listed below.        Follow Up Recommendations SNF    Equipment Recommendations  None recommended by PT    Recommendations for Other Services       Precautions / Restrictions Precautions Precautions: Fall Precaution Comments: xray reveals increased displacement of Pubic rami fracture. Restrictions Other Position/Activity Restrictions: no new orders this admission for ?WBS on the right leg which is increased displacement.      Mobility  Bed Mobility Overal bed mobility: Needs Assistance Bed Mobility: Supine to Sit;Sit to Supine     Supine to sit: Max assist;HOB elevated Sit to supine: Max  assist   General bed mobility comments: assist with right leg and trunk to sitting and both legs  back onto the bed.  Transfers                 General transfer comment: unable to attempt due to pain and gurney is too high to safely attempt as patient may not be able to get back onto the gurney.   Ambulation/Gait                Stairs            Wheelchair Mobility    Modified Rankin (Stroke Patients Only)       Balance Overall balance assessment: Needs assistance;History of Falls Sitting-balance support: Feet supported;Bilateral upper extremity supported Sitting balance-Leahy Scale: Fair                                       Pertinent Vitals/Pain Pain Assessment: 0-10 Faces Pain Scale: Hurts whole lot Pain Location: right hip  Pain Descriptors / Indicators: Cramping;Discomfort;Grimacing;Guarding Pain Intervention(s): Limited activity within patient's tolerance;Monitored during session    Home Living Family/patient expects to be discharged to:: Skilled nursing facility                 Additional Comments: lives with extended family. Husband uses walker; son; daughter in law (dementia). Grandson lives downstairs and is a Insurance underwriter.      Prior Function  Comments: uncertain of patient's ambualtory status since DC last week. Patient has memeory deficits and son not present.     Hand Dominance        Extremity/Trunk Assessment   Upper Extremity Assessment Upper Extremity Assessment: Overall WFL for tasks assessed    Lower Extremity Assessment Lower Extremity Assessment: RLE deficits/detail RLE Deficits / Details: requires max assist to move the  leg to bed edge and back onto bed, unable to flex hip without assist.       Communication      Cognition Arousal/Alertness: Awake/alert Behavior During Therapy: WFL for tasks assessed/performed Overall Cognitive Status: Impaired/Different from baseline Area of  Impairment: Orientation;Memory                 Orientation Level: Place;Time   Memory: Decreased short-term memory         General Comments: the patient is known to this PT from previous admit, patient dioes not recall the events from  first fall as she describrd to therapist last week. patient has multiple scratches on the left glut and hip and thigh that the patient does not know how they were obtained.      General Comments      Exercises     Assessment/Plan    PT Assessment Patient needs continued PT services  PT Problem List Decreased strength;Decreased range of motion;Decreased knowledge of use of DME;Decreased activity tolerance;Decreased safety awareness;Pain;Decreased knowledge of precautions;Decreased mobility       PT Treatment Interventions DME instruction;Gait training;Stair training;Functional mobility training;Therapeutic activities    PT Goals (Current goals can be found in the Care Plan section)  Acute Rehab PT Goals Patient Stated Goal: to get back home PT Goal Formulation: With patient Time For Goal Achievement: 01/08/18 Potential to Achieve Goals: Fair    Frequency Min 2X/week   Barriers to discharge Decreased caregiver support      Co-evaluation               AM-PAC PT "6 Clicks" Daily Activity  Outcome Measure Difficulty turning over in bed (including adjusting bedclothes, sheets and blankets)?: Unable Difficulty moving from lying on back to sitting on the side of the bed? : Unable Difficulty sitting down on and standing up from a chair with arms (e.g., wheelchair, bedside commode, etc,.)?: Unable Help needed moving to and from a bed to chair (including a wheelchair)?: Total Help needed walking in hospital room?: Total Help needed climbing 3-5 steps with a railing? : Total 6 Click Score: 6    End of Session   Activity Tolerance: Patient limited by pain Patient left: in bed;with call bell/phone within reach Nurse Communication:  Mobility status PT Visit Diagnosis: Unsteadiness on feet (R26.81);Pain Pain - Right/Left: Right Pain - part of body: Leg;Hip    Time: 4854-6270 PT Time Calculation (min) (ACUTE ONLY): 29 min   Charges:   PT Evaluation $PT Eval Moderate Complexity: 1 Mod PT Treatments $Therapeutic Activity: 8-22 mins   PT G CodesTresa Endo PT [phone]   Claretha Cooper 12/25/2017, 9:09 AM

## 2017-12-25 NOTE — Assessment & Plan Note (Signed)
Nonsteroidals contraindicated as pain medications for the pain related to  pubic rami fractures

## 2017-12-25 NOTE — ED Notes (Signed)
Report given to nurse at [facility].

## 2017-12-26 ENCOUNTER — Other Ambulatory Visit: Payer: Self-pay | Admitting: Licensed Clinical Social Worker

## 2017-12-26 ENCOUNTER — Encounter: Payer: Self-pay | Admitting: Internal Medicine

## 2017-12-26 NOTE — Patient Outreach (Addendum)
[facility]) Care Management  12/26/2017  BIRGIT NOWLING Mar 14, 1926 [id number]  CSW received new request to follow patient while at SNF. CSW arrived at [facility] and found patient outside of her room in her wheelchair. CSW introduced self, reason for visit and of [place]. Patient provided two HIPPA verifications successfully. Patient went to the ED after falling 3 steps to the ground. Patient has pubic rami fractures from her fall. Patient refused rehabilitation or [place] services while in the hospital and discharged home without informing her son and at that point she started to experience hallucinations from the narcotic pain medications she was on upon her return home. Patient also had additional falls during that time at home. Patient shares that she is going to go back home today which is not accurate per hall nurse since she just got to facility. [facility] CSW notified that patient continues to experience confusion. Patient hesitant to accept [place] [facility] Service involvement at this time (patient has a history of denying services) but is agreeable to allow [facility] CSW to visit again in a few weeks to re-introduce [place].   CSW sent secure email to SNF social worker requesting updates and discharge plan information. CSW will follow up with SNF within two weeks.   Eula Fried, BSW, MSW, [email or web address] Phone: [phone] Fax: [phone]

## 2017-12-27 LAB — URINE CULTURE: Culture: >=100000 — AB

## 2017-12-28 ENCOUNTER — Telehealth: Payer: Self-pay

## 2017-12-28 NOTE — Telephone Encounter (Signed)
Post ED Visit - Positive Culture Follow-up  Culture report reviewed by antimicrobial stewardship pharmacist:  []  Elenor Quinones, Pharm.D. []  Heide Guile, Pharm.D., BCPS AQ-ID [x]  Parks Neptune, Pharm.D., BCPS []  Alycia Rossetti, Pharm.D., BCPS []  [facility], Pharm.D., BCPS, AAHIVP []  Legrand Como, Pharm.D., BCPS, AAHIVP []  Salome Arnt, PharmD, BCPS []  Jalene Mullet, PharmD []  Vincenza Hews, PharmD, BCPS  Positive urine culture Treated with Cephalexin, organism sensitive to the same and no further patient follow-up is required at this time.  Genia Del 12/28/2017, 10:04 AM

## 2017-12-29 ENCOUNTER — Other Ambulatory Visit: Payer: Self-pay | Admitting: Licensed Clinical Social Worker

## 2017-12-29 NOTE — Patient Outreach (Signed)
[facility]) Care Management  12/29/2017  BREANE GRUNWALD 1925/11/10 [id number]  CSW received return email from [facility] in regards to patient updates. Email stated "Ms. Klee had a family meeting today on 12/26/17, she does have cognitive deficits. Plans are to return home where she lives with her son, no discharge date set." CSW will continue to follow case and await for SNF discharge.  Eula Fried, BSW, MSW, [email or web address] Phone: [phone] Fax: [phone]

## 2017-12-31 ENCOUNTER — Other Ambulatory Visit: Payer: Self-pay | Admitting: Licensed Clinical Social Worker

## 2017-12-31 NOTE — Patient Outreach (Signed)
[facility]) Care Management  12/31/2017  MARLISS BUTTACAVOLI 10/14/1925 [id number]  CSW arrived at [facility] SNF to complete visit with patient. Patient was in her room watching television when CSW arrived. Patient reports that she is not as confused as she was last week and CSW can notice the difference as well. Patient reports to be doing very well and has made some progress walking with her walker. Patient shares that she is very involved with her church community and that her church members visited her yesterday. Patient states that she has not participated in any activities at facility stating "I'm not up for that yet." Patient denies any pain, SOB or chest pain. Patient reports to be in better spirits now that she has been making progress in PT. CSW will continue to follow patient and await for SNF discharge.  Eula Fried, BSW, MSW, [email or web address] Phone: [phone] Fax: [phone]

## 2018-01-05 ENCOUNTER — Other Ambulatory Visit: Payer: Self-pay | Admitting: Licensed Clinical Social Worker

## 2018-01-05 NOTE — Patient Outreach (Signed)
[facility]) Care Management  01/05/2018  [name] Feb 21, 1926 [id number]  Assessment- CSW received return email from SNF social worker with patient updates that states "Her plan is to go back home with her son. No discharge date set, but she's doing well with therapy and might discharge back home next week."  Plan-CSW will continue to follow patient closely and will await for SNF discharge.  Eula Fried, BSW, MSW, [email or web address] Phone: [phone] Fax: [phone]

## 2018-01-05 NOTE — Patient Outreach (Signed)
[place] [facility]) Care Management  01/05/2018  TOMIE SPIZZIRRI 1926-01-18 [id number]  Assessment- CSW sent secure email to [facility] social worker questioning if there were any updates on patient and her expected discharge.   Plan-CSW will continue to follow patient and await for SNF discharge.  Eula Fried, BSW, MSW, [email or web address] Phone: [phone] Fax: [phone]

## 2018-01-09 ENCOUNTER — Encounter: Payer: Self-pay | Admitting: Adult Health

## 2018-01-09 ENCOUNTER — Non-Acute Institutional Stay (SKILLED_NURSING_FACILITY): Payer: PPO | Admitting: Adult Health

## 2018-01-09 DIAGNOSIS — N183 Chronic kidney disease, stage 3 unspecified: Secondary | ICD-10-CM

## 2018-01-09 DIAGNOSIS — S32591A Other specified fracture of right pubis, initial encounter for closed fracture: Secondary | ICD-10-CM | POA: Diagnosis not present

## 2018-01-09 DIAGNOSIS — I10 Essential (primary) hypertension: Secondary | ICD-10-CM

## 2018-01-09 DIAGNOSIS — E876 Hypokalemia: Secondary | ICD-10-CM

## 2018-01-09 MED ORDER — ATENOLOL 50 MG PO TABS: 50.0000 mg | ORAL_TABLET | Freq: Every day | ORAL | 0 refills | Status: DC

## 2018-01-09 MED ORDER — FUROSEMIDE 40 MG PO TABS: 40.0000 mg | ORAL_TABLET | Freq: Every day | ORAL | 0 refills | Status: AC

## 2018-01-09 MED ORDER — POTASSIUM CHLORIDE CRYS ER 20 MEQ PO TBCR
20.0000 meq | EXTENDED_RELEASE_TABLET | Freq: Every day | ORAL | 0 refills | Status: AC
Start: 2018-01-09 — End: ?

## 2018-01-09 MED ORDER — AMITRIPTYLINE HCL 50 MG PO TABS: 50.0000 mg | ORAL_TABLET | Freq: Every evening | ORAL | 0 refills | Status: DC | PRN

## 2018-01-09 MED ORDER — FELODIPINE ER 10 MG PO TB24: 10.0000 mg | ORAL_TABLET | Freq: Every day | ORAL | 0 refills | Status: DC

## 2018-01-09 NOTE — Progress Notes (Signed)
Location:  [facility] Room Number: 116-A Place of Service:  SNF (31) Provider:  Durenda Age, NP  Patient Care Team: Crist Infante, MD as PCP - General (Internal Medicine)  Extended Emergency Contact Information Primary Emergency Contact: Clingerman,Joe Address: [address]          [place], [place] [address] Johnnette Litter of [place] Phone: [phone] Mobile Phone: [phone] Relation: Spouse Secondary Emergency Contact: Milne,David Mobile Phone: [phone] Relation: Son  Code Status:  Full Code  Goals of care: Advanced Directive information Advanced Directives 12/24/2017  Does Patient Have a Medical Advance Directive? No  Would patient like information on creating a medical advance directive? -     Chief Complaint  Patient presents with  . Discharge Note    Patient is discharging home on 01/10/18 with home health services    HPI:  Pt is an 84 y.o. female seen today for a discharge visit.  She will discharge to home on 01/10/18 with home health OT, PT, and SW services.  She has a PMH of HTN, TIA, esophageal strictures, breast cancer in remission, and possible diastolic dysfunction.    She has been admitted to [facility] on 12/25/17. She was in ED on 3/13 after a fall at home sustaining right superior and inferior pubic rami fractures with displacement involving the superior pubic ramus. This was felt to be nonoperative. She was given morphine in the ED and was discharged the next day with Hydrocodone-acetaminophen. Narcotic pain medications caused her to have visual hallucinations and could not get up without assistance. She complained of urinary retention and urinalysis suggested UTI and was given Keflex.  She has completed antibiotics. She was seen in the room today with Speech Therapist and son at bedside. She is not taking any narcotics and only on Acetaminophen PRN for pain.  Patient was admitted to this facility  for short-term rehabilitation after the patient's recent hospitalization.  Patient has completed SNF rehabilitation and therapy has cleared the patient for discharge.    Past Medical History:  Diagnosis Date  . Anxiety   . Arthritis   . Breast cancer ([place])    left  . Cough   . Esophageal stricture   . GERD (gastroesophageal reflux disease)   . Hyperlipidemia   . Hypertension   . Thrombosed external hemorrhoid, R posterior 01/20/2012  . TIA (transient ischemic attack)    Past Surgical History:  Procedure Laterality Date  . ABDOMINAL HYSTERECTOMY    . BREAST LUMPECTOMY  2010   left  . CHOLECYSTECTOMY  2002  . CYSTECTOMY  in her 65's  . HEEL SPUR SURGERY     right  . MASTECTOMY, PARTIAL     left  . TONSILLECTOMY      Allergies  Allergen Reactions  . Hydrocodone     See 12/24/17 ED note visual hallucinations, combativeness, and recurrent falling on hydrocodone   . Ibandronate Sodium     REACTION: joint aches  . Latex     unsure  . Sulfonamide Derivatives Hives  . Tessalon [Benzonatate]     Fast heart rate, kept pt awake    Outpatient Encounter Medications as of 01/09/2018  Medication Sig  . acetaminophen (TYLENOL) 325 MG tablet Take 650 mg by mouth every 6 (six) hours as needed for mild pain.  Marland Kitchen amitriptyline (ELAVIL) 50 MG tablet Take 50 mg by mouth at bedtime as needed for sleep.   . ATENOLOL PO Take 50 mg by mouth daily.   Marland Kitchen  Cholecalciferol (VITAMIN D3) 2000 UNITS TABS Take 1 tablet by mouth daily.   . felodipine (PLENDIL) 10 MG 24 hr tablet Take 10 mg by mouth daily.   . furosemide (LASIX) 40 MG tablet Take 40 mg by mouth daily.   . potassium chloride SA (K-DUR,KLOR-CON) 20 MEQ tablet Take 20 mEq by mouth daily.   . psyllium (METAMUCIL SMOOTH TEXTURE) 28 % packet Take 1 packet by mouth 2 (two) times daily.  . [DISCONTINUED] cephALEXin (KEFLEX) 500 MG capsule Take 1 capsule (500 mg total) by mouth 4 (four) times daily.   No facility-administered encounter  medications on file as of 01/09/2018.     Review of Systems  GENERAL: No change in appetite, no fatigue, no weight changes, no fever, chills or weakness MOUTH and THROAT: Denies oral discomfort, gingival pain or bleeding, pain from teeth or hoarseness   RESPIRATORY: no cough, SOB, DOE, wheezing, hemoptysis CARDIAC: No chest pain, edema or palpitations GI: No abdominal pain, diarrhea, constipation, heart burn, nausea or vomiting GU: Denies dysuria, frequency, hematuria, incontinence, or discharge PSYCHIATRIC: Denies feelings of depression or anxiety. No report of hallucinations, insomnia, paranoia, or agitation    Pertinent  Health Maintenance Due  Topic Date Due  . PNA vac Low Risk Adult (1 of 2 - PCV13) 07/02/1993  . DEXA SCAN  02/08/2018 (Originally 07/02/1993)  . INFLUENZA VACCINE  05/07/2018      Vitals:   01/09/18 1016  BP: 113/67  Pulse: 77  Resp: 20  Temp: 98.4 F (36.9 C)  TempSrc: Oral  SpO2: 95%  Weight: 153 lb 12.8 oz (69.8 kg)  Height: 5\' 6"  (1.676 m)   Body mass index is 24.82 kg/m.  Physical Exam  GENERAL APPEARANCE: Well nourished. In no acute distress. Normal body habitus SKIN:  Skin is warm and dry.  MOUTH and THROAT: Lips are without lesions. Oral mucosa is moist and without lesions. Tongue is normal in shape, size, and color and without lesions RESPIRATORY: Breathing is even & unlabored, BS CTAB CARDIAC: RRR, no murmur,no extra heart sounds, no edema GI: Abdomen soft, normal BS, no masses, no tenderness EXTREMITIES:  Able to move X 4 extremities PSYCHIATRIC: Alert to self, disoriented to time and place. Affect and behavior are appropriate    Labs reviewed: Recent Labs    12/18/17 0010 12/18/17 0609  NA 137 140  K 3.5 3.7  CL 99* 100*  CO2 26 29  GLUCOSE 123* 136*  BUN 22* 22*  CREATININE 1.06* 1.14*  CALCIUM 9.9 9.4   Recent Labs    12/18/17 0010  AST 40  ALT 30  ALKPHOS 73  BILITOT 0.7  PROT 7.2  ALBUMIN 4.0   Recent Labs     12/18/17 0010 12/18/17 0609  WBC 13.9* 9.3  NEUTROABS 11.1*  --   HGB 13.8 12.8  HCT 40.2 38.4  MCV 86.5 87.7  PLT 251 227    Significant Diagnostic Results in last 30 days:  Dg Chest 2 View  Result Date: 12/18/2017 CLINICAL DATA:  Preoperative chest radiograph. EXAM: CHEST - 2 VIEW COMPARISON:  Chest radiograph performed 06/26/2011, and CT of the chest performed 10/17/2015 FINDINGS: The lungs are well-aerated and clear. Hilar prominence appears to reflect normal vasculature, on correlation with the prior CT. There is no evidence of focal opacification, pleural effusion or pneumothorax. The heart is borderline normal in size. No acute osseous abnormalities are seen. IMPRESSION: No acute cardiopulmonary process seen. Electronically Signed   By: Francoise Schaumann.D.  On: 12/18/2017 00:34   Ct Head Wo Contrast  Result Date: 12/24/2017 CLINICAL DATA:  Fall with altered LOC EXAM: CT HEAD WITHOUT CONTRAST TECHNIQUE: Contiguous axial images were obtained from the base of the skull through the vertex without intravenous contrast. COMPARISON:  CT brain 12/17/2017 FINDINGS: Brain: No acute territorial infarction, hemorrhage or intracranial mass is visualized. Old lacunar infarct in the left basal ganglia. Moderate atrophy and small vessel ischemic changes of the white matter. Stable ventricle size. Vascular: No hyperdense vessels.  Carotid vascular calcification. Skull: No depressed skull fracture Sinuses/Orbits: No acute finding. Other: None IMPRESSION: 1. No CT evidence for acute intracranial abnormality. 2. Atrophy and small vessel ischemic changes of the white matter. Electronically Signed   By: Donavan Foil M.D.   On: 12/24/2017 17:30   Ct Head Wo Contrast  Result Date: 12/18/2017 CLINICAL DATA:  Tripped while going up steps on porch, with concern for head injury. EXAM: CT HEAD WITHOUT CONTRAST TECHNIQUE: Contiguous axial images were obtained from the base of the skull through the vertex without  intravenous contrast. COMPARISON:  None. FINDINGS: Brain: No evidence of acute infarction, hemorrhage, hydrocephalus, extra-axial collection or mass lesion / mass effect. Prominence of the ventricles and sulci reflects mild to moderate cortical volume loss. Cerebellar atrophy is noted. Scattered periventricular and subcortical white matter change likely reflects small vessel ischemic microangiopathy. A chronic lacunar infarct is noted at the left basal ganglia. The brainstem and fourth ventricle are within normal limits. The basal ganglia are unremarkable in appearance. The cerebral hemispheres demonstrate grossly normal gray-white differentiation. No mass effect or midline shift is seen. Vascular: No hyperdense vessel or unexpected calcification. Skull: There is no evidence of fracture; visualized osseous structures are unremarkable in appearance. Sinuses/Orbits: The visualized portions of the orbits are within normal limits. The paranasal sinuses and mastoid air cells are well-aerated. Other: No significant soft tissue abnormalities are seen. IMPRESSION: 1. No evidence of traumatic intracranial injury or fracture. 2. Mild to moderate cortical volume loss and scattered small vessel ischemic microangiopathy. 3. Chronic lacunar infarct at the left basal ganglia. Electronically Signed   By: Garald Balding M.D.   On: 12/18/2017 00:50   Ct Pelvis Wo Contrast  Result Date: 12/18/2017 CLINICAL DATA:  Pelvic trauma.  Fall.  Right hip pain. EXAM: CT PELVIS WITHOUT CONTRAST TECHNIQUE: Multidetector CT imaging of the pelvis was performed following the standard protocol without intravenous contrast. COMPARISON:  Pelvic radiograph 12/17/2017 FINDINGS: Urinary Tract:  Distended urinary bladder. Bowel:  Unremarkable visualized pelvic bowel loops. Vascular/Lymphatic: No pathologically enlarged lymph nodes. No significant vascular abnormality seen. Reproductive:  Status post hysterectomy. Other:  None. Musculoskeletal: There  are comminuted fractures of the right superior and inferior pubic rami. Pubic symphysis remains normally articulated. Sacroiliac joints are approximated. No hip fracture. Incidentally noted left superior pubic ramus bone island. Lower lumbar degenerative disc disease and facet hypertrophy. IMPRESSION: Comminuted fractures of the right superior and inferior pubic rami. No other pelvic or hip fracture. Electronically Signed   By: Ulyses Jarred M.D.   On: 12/18/2017 00:49   Dg Hip Unilat With Pelvis 2-3 Views Right  Result Date: 12/24/2017 CLINICAL DATA:  Fall with pelvic fracture EXAM: DG HIP (WITH OR WITHOUT PELVIS) 2-3V RIGHT COMPARISON:  12/17/2017 FINDINGS: Right superior and inferior pubic rami fractures with increased displacement of the right superior pubic ramus fracture. Some periosteal reaction suggested. Pubic symphysis is intact. Both femoral heads project in joint. IMPRESSION: Right superior and inferior pubic rami fractures are again  demonstrated, with interval increase in displacement of the superior pubic ramus fracture since prior study. Electronically Signed   By: Donavan Foil M.D.   On: 12/24/2017 17:34   Dg Hip Unilat  With Pelvis 2-3 Views Right  Result Date: 12/17/2017 CLINICAL DATA:  Right hip pain after tripping at 1500 hours today. EXAM: DG HIP (WITH OR WITHOUT PELVIS) 2-3V RIGHT COMPARISON:  None. FINDINGS: Acute right superior and inferior pubic rami fractures are identified with one shaft width displacement involving the superior pubic ramus fracture. Femoral heads are seated within their femoral components without fracture or joint dislocation. Mild degenerative joint space narrowing of both hips. There is mild lower lumbar degenerative disc disease with disc space flattening and endplate spurring. IMPRESSION: Acute right superior and inferior pubic rami fractures adjacent to the pubic symphysis, displaced involving the superior pubic ramus. No fracture of either hip.  Electronically Signed   By: Ashley Royalty M.D.   On: 12/17/2017 22:53    Assessment/Plan  1. Closed fracture of multiple rami of right pubis, initial encounter ([facility]) - for Home health PT and OT, for therapeutic strengthening exercises, fall precautions, continue Acetaminophen PRN for pain   2. Essential hypertension - well-controlled - atenolol (TENORMIN) 50 MG tablet; Take 1 tablet (50 mg total) by mouth daily.  Dispense: 30 tablet; Refill: 0 - felodipine (PLENDIL) 10 MG 24 hr tablet; Take 1 tablet (10 mg total) by mouth daily.  Dispense: 30 tablet; Refill: 0 - furosemide (LASIX) 40 MG tablet; Take 1 tablet (40 mg total) by mouth daily.  Dispense: 30 tablet; Refill: 0  3. CKD (chronic kidney disease) stage 3, GFR 30-59 ml/min (HCC) - stable Lab Results  Component Value Date   CREATININE 1.14 (H) 12/18/2017     4. Hypokalemia - stable - potassium chloride SA (K-DUR,KLOR-CON) 20 MEQ tablet; Take 1 tablet (20 mEq total) by mouth daily.  Dispense: 30 tablet; Refill: 0 Lab Results  Component Value Date   K 3.7 12/18/2017      I have filled out patient's discharge paperwork and written prescriptions.  Patient will receive home health PT, OT, and SW.  DME provided:  None  Total discharge time: Less than 30 minutes  Discharge time involved coordination of the discharge process with social worker, nursing staff and therapy department. Medical justification for home health services verified.   Durenda Age, NP [facility] [phone] (Monday-Friday 8:00 a.m. - 5:00 p.m.) [phone] (after hours)

## 2018-01-12 ENCOUNTER — Other Ambulatory Visit: Payer: Self-pay | Admitting: Licensed Clinical Social Worker

## 2018-01-12 DIAGNOSIS — I129 Hypertensive chronic kidney disease with stage 1 through stage 4 chronic kidney disease, or unspecified chronic kidney disease: Secondary | ICD-10-CM | POA: Diagnosis not present

## 2018-01-12 DIAGNOSIS — Z87440 Personal history of urinary (tract) infections: Secondary | ICD-10-CM | POA: Diagnosis not present

## 2018-01-12 DIAGNOSIS — M1991 Primary osteoarthritis, unspecified site: Secondary | ICD-10-CM | POA: Diagnosis not present

## 2018-01-12 DIAGNOSIS — F418 Other specified anxiety disorders: Secondary | ICD-10-CM | POA: Diagnosis not present

## 2018-01-12 DIAGNOSIS — S32511D Fracture of superior rim of right pubis, subsequent encounter for fracture with routine healing: Secondary | ICD-10-CM | POA: Diagnosis not present

## 2018-01-12 DIAGNOSIS — N183 Chronic kidney disease, stage 3 (moderate): Secondary | ICD-10-CM | POA: Diagnosis not present

## 2018-01-12 DIAGNOSIS — F419 Anxiety disorder, unspecified: Secondary | ICD-10-CM | POA: Diagnosis not present

## 2018-01-12 NOTE — Patient Outreach (Signed)
[facility]) Care Management  01/12/2018  [name] [birth year] [id number]  CSW completed chart review and became aware that patient discharged back home from [facility] on 01/10/18 with home health OT, PT, and SW services. CSW sent secure email to SNF social worker requesting any additional discharge updates. [facility] CSW will complete referral to [facility] at this time. [facility] CSW will sign off at this time.  Eula Fried, BSW, MSW, [email or web address] Phone: [phone] Fax: [phone]

## 2018-01-13 ENCOUNTER — Other Ambulatory Visit: Payer: Self-pay | Admitting: *Deleted

## 2018-01-13 ENCOUNTER — Encounter: Payer: Self-pay | Admitting: *Deleted

## 2018-01-13 DIAGNOSIS — M1990 Unspecified osteoarthritis, unspecified site: Secondary | ICD-10-CM | POA: Diagnosis not present

## 2018-01-13 DIAGNOSIS — E038 Other specified hypothyroidism: Secondary | ICD-10-CM | POA: Diagnosis not present

## 2018-01-13 DIAGNOSIS — Z6824 Body mass index (BMI) 24.0-24.9, adult: Secondary | ICD-10-CM | POA: Diagnosis not present

## 2018-01-13 DIAGNOSIS — N390 Urinary tract infection, site not specified: Secondary | ICD-10-CM | POA: Diagnosis not present

## 2018-01-13 DIAGNOSIS — K219 Gastro-esophageal reflux disease without esophagitis: Secondary | ICD-10-CM | POA: Diagnosis not present

## 2018-01-13 DIAGNOSIS — N183 Chronic kidney disease, stage 3 (moderate): Secondary | ICD-10-CM | POA: Diagnosis not present

## 2018-01-13 DIAGNOSIS — R5383 Other fatigue: Secondary | ICD-10-CM | POA: Diagnosis not present

## 2018-01-13 DIAGNOSIS — I10 Essential (primary) hypertension: Secondary | ICD-10-CM | POA: Diagnosis not present

## 2018-01-13 DIAGNOSIS — S329XXS Fracture of unspecified parts of lumbosacral spine and pelvis, sequela: Secondary | ICD-10-CM | POA: Diagnosis not present

## 2018-01-13 DIAGNOSIS — J302 Other seasonal allergic rhinitis: Secondary | ICD-10-CM | POA: Diagnosis not present

## 2018-01-13 NOTE — Patient Outreach (Signed)
[facility]) Care Management  01/13/2018  [name] [birth year] [id number]    Transition of care (successful)  RN spoke with POA son Shanon Brow today who is the primary caregiver for the pt. RN introduced the [facility] services and purpose for today call. Caregiver receptive and reviewed the discharge information with review of pt's medications. Verified pt has her hospital follow up appointment today at 2 PM with her primary. Son will request at that time any needed refill on pt' continue medications. States pt is very weak and stays in the chair a lot with minimal consumption of food. Reports [facility] has had their initial home visit and pt will have OT/PT and a available SW.  States pt continues to have a forgetfulness due to the recent UTI but will report this information to the provider's office today. Pt will hospitalized for a pelvis fracture prior to her SNF placement. RN inquired further on possible needs as caregiver reports none at this time. RN offered home safety evaluation to prevent risk of falls/injuries however at this time son states his home was built handicap accessible with wide doors and safety features placed for his parents who live with him at this time. RN inquired on other medical condition HTN as son reports her BP was 130/70 and remains stable with no issues. RN offered any needed community resources at this time however none needed. Offered to continue transition of care based upon a plan of care related to safety and weakness however son feels this is not necessary and declined at this time however appreciative and grateful for the call. Offered to send a information via [facility] if further needs arise and provided caregiver with the RN case manager's contact number if needs arise. No further contacts at this time as this case will be non-active for further contacts. Will alert provider of pt's disposition with [facility].   Patient was recently discharged from hospital and  all medications have been reviewed.  Raina Mina, RN Care Management Coordinator [facility] Office [phone]

## 2018-02-01 ENCOUNTER — Other Ambulatory Visit: Payer: Self-pay | Admitting: Adult Health

## 2018-02-01 DIAGNOSIS — E876 Hypokalemia: Secondary | ICD-10-CM

## 2018-03-06 DIAGNOSIS — I10 Essential (primary) hypertension: Secondary | ICD-10-CM | POA: Diagnosis not present

## 2018-03-06 DIAGNOSIS — R413 Other amnesia: Secondary | ICD-10-CM | POA: Diagnosis not present

## 2018-03-06 DIAGNOSIS — F3289 Other specified depressive episodes: Secondary | ICD-10-CM | POA: Diagnosis not present

## 2018-03-06 DIAGNOSIS — L988 Other specified disorders of the skin and subcutaneous tissue: Secondary | ICD-10-CM | POA: Diagnosis not present

## 2018-03-06 DIAGNOSIS — M810 Age-related osteoporosis without current pathological fracture: Secondary | ICD-10-CM | POA: Diagnosis not present

## 2018-03-06 DIAGNOSIS — Z6825 Body mass index (BMI) 25.0-25.9, adult: Secondary | ICD-10-CM | POA: Diagnosis not present

## 2018-03-06 DIAGNOSIS — R7301 Impaired fasting glucose: Secondary | ICD-10-CM | POA: Diagnosis not present

## 2018-03-13 ENCOUNTER — Other Ambulatory Visit (HOSPITAL_COMMUNITY): Payer: Self-pay | Admitting: *Deleted

## 2018-03-16 ENCOUNTER — Ambulatory Visit (HOSPITAL_COMMUNITY)
Admission: RE | Admit: 2018-03-16 | Discharge: 2018-03-16 | Disposition: A | Discharge status: Home / Self Care | Payer: PPO | Source: Ambulatory Visit | Attending: Internal Medicine | Admitting: Internal Medicine

## 2018-03-16 DIAGNOSIS — M810 Age-related osteoporosis without current pathological fracture: Secondary | ICD-10-CM | POA: Insufficient documentation

## 2018-03-16 MED ORDER — DENOSUMAB 60 MG/ML ~~LOC~~ SOSY
60.0000 mg | PREFILLED_SYRINGE | Freq: Once | SUBCUTANEOUS | Status: AC
  Administered 2018-03-16: 14:00:00 60 mg via SUBCUTANEOUS
  Filled 2018-03-16: qty 1

## 2018-03-16 NOTE — Discharge Instructions (Signed)
Denosumab injection °What is this medicine? °DENOSUMAB (den oh sue mab) slows bone breakdown. Prolia is used to treat osteoporosis in women after menopause and in men. Xgeva is used to treat a high calcium level due to cancer and to prevent bone fractures and other bone problems caused by multiple myeloma or cancer bone metastases. Xgeva is also used to treat giant cell tumor of the bone. °This medicine may be used for other purposes; ask your health care provider or pharmacist if you have questions. °COMMON BRAND NAME(S): Prolia, XGEVA °What should I tell my health care provider before I take this medicine? °They need to know if you have any of these conditions: °-dental disease °-having surgery or tooth extraction °-infection °-kidney disease °-low levels of calcium or Vitamin D in the blood °-malnutrition °-on hemodialysis °-skin conditions or sensitivity °-thyroid or parathyroid disease °-an unusual reaction to denosumab, other medicines, foods, dyes, or preservatives °-pregnant or trying to get pregnant °-breast-feeding °How should I use this medicine? °This medicine is for injection under the skin. It is given by a health care professional in a hospital or clinic setting. °If you are getting Prolia, a special MedGuide will be given to you by the pharmacist with each prescription and refill. Be sure to read this information carefully each time. °For Prolia, talk to your pediatrician regarding the use of this medicine in children. Special care may be needed. For Xgeva, talk to your pediatrician regarding the use of this medicine in children. While this drug may be prescribed for children as young as 13 years for selected conditions, precautions do apply. °Overdosage: If you think you have taken too much of this medicine contact a poison control center or emergency room at once. °NOTE: This medicine is only for you. Do not share this medicine with others. °What if I miss a dose? °It is important not to miss your  dose. Call your doctor or health care professional if you are unable to keep an appointment. °What may interact with this medicine? °Do not take this medicine with any of the following medications: °-other medicines containing denosumab °This medicine may also interact with the following medications: °-medicines that lower your chance of fighting infection °-steroid medicines like prednisone or cortisone °This list may not describe all possible interactions. Give your health care provider a list of all the medicines, herbs, non-prescription drugs, or dietary supplements you use. Also tell them if you smoke, drink alcohol, or use illegal drugs. Some items may interact with your medicine. °What should I watch for while using this medicine? °Visit your doctor or health care professional for regular checks on your progress. Your doctor or health care professional may order blood tests and other tests to see how you are doing. °Call your doctor or health care professional for advice if you get a fever, chills or sore throat, or other symptoms of a cold or flu. Do not treat yourself. This drug may decrease your body's ability to fight infection. Try to avoid being around people who are sick. °You should make sure you get enough calcium and vitamin D while you are taking this medicine, unless your doctor tells you not to. Discuss the foods you eat and the vitamins you take with your health care professional. °See your dentist regularly. Brush and floss your teeth as directed. Before you have any dental work done, tell your dentist you are receiving this medicine. °Do not become pregnant while taking this medicine or for 5 months after stopping   it. Talk with your doctor or health care professional about your birth control options while taking this medicine. Women should inform their doctor if they wish to become pregnant or think they might be pregnant. There is a potential for serious side effects to an unborn child. Talk  to your health care professional or pharmacist for more information. What side effects may I notice from receiving this medicine? Side effects that you should report to your doctor or health care professional as soon as possible: -allergic reactions like skin rash, itching or hives, swelling of the face, lips, or tongue -bone pain -breathing problems -dizziness -jaw pain, especially after dental work -redness, blistering, peeling of the skin -signs and symptoms of infection like fever or chills; cough; sore throat; pain or trouble passing urine -signs of low calcium like fast heartbeat, muscle cramps or muscle pain; pain, tingling, numbness in the hands or feet; seizures -unusual bleeding or bruising -unusually weak or tired Side effects that usually do not require medical attention (report to your doctor or health care professional if they continue or are bothersome): -constipation -diarrhea -headache -joint pain -loss of appetite -muscle pain -runny nose -tiredness -upset stomach This list may not describe all possible side effects. Call your doctor for medical advice about side effects. You may report side effects to FDA at 1-800-FDA-1088. Where should I keep my medicine? This medicine is only given in a clinic, doctor's office, or other health care setting and will not be stored at home. NOTE: This sheet is a summary. It may not cover all possible information. If you have questions about this medicine, talk to your doctor, pharmacist, or health care provider.  2018 Elsevier/Gold Standard (2016-10-15 19:17:21)

## 2018-03-31 DIAGNOSIS — H903 Sensorineural hearing loss, bilateral: Secondary | ICD-10-CM | POA: Diagnosis not present

## 2018-04-02 ENCOUNTER — Other Ambulatory Visit: Payer: Self-pay | Admitting: Adult Health

## 2018-04-02 DIAGNOSIS — I10 Essential (primary) hypertension: Secondary | ICD-10-CM

## 2018-04-08 ENCOUNTER — Other Ambulatory Visit: Payer: Self-pay | Admitting: Adult Health

## 2018-04-08 DIAGNOSIS — D485 Neoplasm of uncertain behavior of skin: Secondary | ICD-10-CM | POA: Diagnosis not present

## 2018-04-08 DIAGNOSIS — E876 Hypokalemia: Secondary | ICD-10-CM

## 2018-04-08 DIAGNOSIS — D692 Other nonthrombocytopenic purpura: Secondary | ICD-10-CM | POA: Diagnosis not present

## 2018-04-11 ENCOUNTER — Other Ambulatory Visit: Payer: Self-pay | Admitting: Adult Health

## 2018-04-11 DIAGNOSIS — E876 Hypokalemia: Secondary | ICD-10-CM

## 2018-04-27 DIAGNOSIS — L570 Actinic keratosis: Secondary | ICD-10-CM | POA: Diagnosis not present

## 2018-05-21 DIAGNOSIS — E038 Other specified hypothyroidism: Secondary | ICD-10-CM | POA: Diagnosis not present

## 2018-05-21 DIAGNOSIS — M810 Age-related osteoporosis without current pathological fracture: Secondary | ICD-10-CM | POA: Diagnosis not present

## 2018-05-21 DIAGNOSIS — Z6825 Body mass index (BMI) 25.0-25.9, adult: Secondary | ICD-10-CM | POA: Diagnosis not present

## 2018-05-21 DIAGNOSIS — R413 Other amnesia: Secondary | ICD-10-CM | POA: Diagnosis not present

## 2018-05-21 DIAGNOSIS — S329XXS Fracture of unspecified parts of lumbosacral spine and pelvis, sequela: Secondary | ICD-10-CM | POA: Diagnosis not present

## 2018-05-21 DIAGNOSIS — I10 Essential (primary) hypertension: Secondary | ICD-10-CM | POA: Diagnosis not present

## 2018-06-13 DIAGNOSIS — N390 Urinary tract infection, site not specified: Secondary | ICD-10-CM | POA: Diagnosis not present

## 2018-06-13 DIAGNOSIS — R300 Dysuria: Secondary | ICD-10-CM | POA: Diagnosis not present

## 2018-06-29 DIAGNOSIS — Z6824 Body mass index (BMI) 24.0-24.9, adult: Secondary | ICD-10-CM | POA: Diagnosis not present

## 2018-06-29 DIAGNOSIS — R410 Disorientation, unspecified: Secondary | ICD-10-CM | POA: Diagnosis not present

## 2018-06-29 DIAGNOSIS — I10 Essential (primary) hypertension: Secondary | ICD-10-CM | POA: Diagnosis not present

## 2018-07-03 ENCOUNTER — Encounter (HOSPITAL_COMMUNITY): Payer: Self-pay | Admitting: Emergency Medicine

## 2018-07-03 ENCOUNTER — Emergency Department (HOSPITAL_COMMUNITY)
Admission: EM | Admit: 2018-07-03 | Discharge: 2018-07-06 | Disposition: A | Discharge status: Other Acute Inpatient Hospital | Payer: PPO | Attending: Emergency Medicine | Admitting: Emergency Medicine

## 2018-07-03 DIAGNOSIS — F918 Other conduct disorders: Secondary | ICD-10-CM | POA: Diagnosis not present

## 2018-07-03 DIAGNOSIS — N183 Chronic kidney disease, stage 3 (moderate): Secondary | ICD-10-CM | POA: Diagnosis not present

## 2018-07-03 DIAGNOSIS — F0391 Unspecified dementia with behavioral disturbance: Secondary | ICD-10-CM | POA: Insufficient documentation

## 2018-07-03 DIAGNOSIS — Z008 Encounter for other general examination: Secondary | ICD-10-CM

## 2018-07-03 DIAGNOSIS — I129 Hypertensive chronic kidney disease with stage 1 through stage 4 chronic kidney disease, or unspecified chronic kidney disease: Secondary | ICD-10-CM | POA: Diagnosis not present

## 2018-07-03 DIAGNOSIS — R4589 Other symptoms and signs involving emotional state: Secondary | ICD-10-CM | POA: Diagnosis not present

## 2018-07-03 DIAGNOSIS — Z79899 Other long term (current) drug therapy: Secondary | ICD-10-CM | POA: Diagnosis not present

## 2018-07-03 DIAGNOSIS — R259 Unspecified abnormal involuntary movements: Secondary | ICD-10-CM | POA: Diagnosis not present

## 2018-07-03 DIAGNOSIS — F29 Unspecified psychosis not due to a substance or known physiological condition: Secondary | ICD-10-CM | POA: Diagnosis not present

## 2018-07-03 DIAGNOSIS — Z853 Personal history of malignant neoplasm of breast: Secondary | ICD-10-CM | POA: Diagnosis not present

## 2018-07-03 DIAGNOSIS — Z91040 Latex allergy status: Secondary | ICD-10-CM | POA: Insufficient documentation

## 2018-07-03 DIAGNOSIS — R451 Restlessness and agitation: Secondary | ICD-10-CM | POA: Diagnosis not present

## 2018-07-03 DIAGNOSIS — Z046 Encounter for general psychiatric examination, requested by authority: Secondary | ICD-10-CM | POA: Diagnosis present

## 2018-07-03 DIAGNOSIS — I10 Essential (primary) hypertension: Secondary | ICD-10-CM | POA: Diagnosis not present

## 2018-07-03 DIAGNOSIS — R4689 Other symptoms and signs involving appearance and behavior: Secondary | ICD-10-CM | POA: Diagnosis present

## 2018-07-03 LAB — BASIC METABOLIC PANEL
Anion gap: 12 (ref 5–15)
BUN: 19 mg/dL (ref 8–23)
CALCIUM: 9.7 mg/dL (ref 8.9–10.3)
CO2: 21 mmol/L — AB (ref 22–32)
CREATININE: 1.24 mg/dL — AB (ref 0.44–1.00)
Chloride: 107 mmol/L (ref 98–111)
GFR calc non Af Amer: 37 mL/min — ABNORMAL LOW (ref >60)
GFR, EST AFRICAN AMERICAN: 43 mL/min — AB (ref >60)
Glucose, Bld: 106 mg/dL — ABNORMAL HIGH (ref 70–99)
Potassium: 3.5 mmol/L (ref 3.5–5.1)
SODIUM: 140 mmol/L (ref 135–145)

## 2018-07-03 LAB — CBC WITH DIFFERENTIAL/PLATELET
Basophils Absolute: 0 10*3/uL (ref 0.0–0.1)
Basophils Relative: 0 %
EOS ABS: 0 10*3/uL (ref 0.0–0.7)
EOS PCT: 0 %
HCT: 39.8 % (ref 36.0–46.0)
Hemoglobin: 13.3 g/dL (ref 12.0–15.0)
Lymphocytes Relative: 11 %
Lymphs Abs: 0.9 10*3/uL (ref 0.7–4.0)
MCH: 29.2 pg (ref 26.0–34.0)
MCHC: 33.4 g/dL (ref 30.0–36.0)
MCV: 87.3 fL (ref 78.0–100.0)
MONOS PCT: 8 %
Monocytes Absolute: 0.6 10*3/uL (ref 0.1–1.0)
Neutro Abs: 6.3 10*3/uL (ref 1.7–7.7)
Neutrophils Relative %: 81 %
Platelets: 241 10*3/uL (ref 150–400)
RBC: 4.56 MIL/uL (ref 3.87–5.11)
RDW: 13.3 % (ref 11.5–15.5)
WBC: 7.8 10*3/uL (ref 4.0–10.5)

## 2018-07-03 LAB — URINALYSIS, ROUTINE W REFLEX MICROSCOPIC
Bilirubin Urine: NEGATIVE
Glucose, UA: NEGATIVE mg/dL
Hgb urine dipstick: NEGATIVE
KETONES UR: NEGATIVE mg/dL
Leukocytes, UA: NEGATIVE
Nitrite: NEGATIVE
PH: 6 (ref 5.0–8.0)
Protein, ur: NEGATIVE mg/dL
Specific Gravity, Urine: 1.013 (ref 1.005–1.030)

## 2018-07-03 MED ORDER — HALOPERIDOL 5 MG PO TABS
5.0000 mg | ORAL_TABLET | Freq: Once | ORAL | Status: DC
  Filled 2018-07-03: qty 1

## 2018-07-03 MED ORDER — HALOPERIDOL LACTATE 5 MG/ML IJ SOLN
5.0000 mg | Freq: Once | INTRAMUSCULAR | Status: AC
  Administered 2018-07-03: 5 mg via INTRAMUSCULAR
  Filled 2018-07-03: qty 1

## 2018-07-03 MED ORDER — ATENOLOL 50 MG PO TABS
50.0000 mg | ORAL_TABLET | Freq: Every day | ORAL | Status: DC
  Administered 2018-07-03 – 2018-07-04 (×2): 50 mg via ORAL
  Filled 2018-07-03 (×2): qty 1

## 2018-07-03 NOTE — ED Provider Notes (Signed)
[facility] DEPT Provider Note   CSN: [id number] Arrival date & time: 07/03/18  0909     History   Chief Complaint Chief Complaint  Patient presents with  . Aggressive Behavior    HPI [name] is a 84 y.o. female.  84 year old female with prior medical history as documented below presents for evaluation of agitation.  Patient arrives by EMS from her home.  She lives at home with her husband, her son, and her daughter-in-law.  The son is reportedly the primary caregiver.  Patient has a history of dementia.  Per EMS, patient has had increased agitation over the last 2 to 3 days.  The son called the EMS crew to transfer the patient to the hospital for evaluation.  Upon arrival to the ED the patient is alert and oriented.  She is comfortable.  She is without specific complaint.  She is pleasant and without evidence of agitation.  Patient denies current complaint.  She denies chest pain, shortness of breath, nausea, vomiting, or other acute complaint.  EMS also reports that the son is the caregiver for this patient and the patient's husband and the patient's daughter-in-law - EMS states that "he has his hands full."   Additional history obtained from the patient's son upon his arrival to the ED.  He reports increasing agitation and unsafe behaviors at home.  The patient has reportedly tried to leave the house in the middle the night.  She has had difficulty recognizing her surroundings or family.  She has been aggressive and agitated at times. The son feels that she is not safe at home.  He feels that she belongs in a facility where her behaviors can be closely monitored.  The history is provided by the patient, medical records and the EMS personnel.  Illness  This is a new problem. The current episode started more than 2 days ago. Episode frequency: unknown. The problem has been resolved. Pertinent negatives include no chest pain, no abdominal pain, no  headaches and no shortness of breath. Nothing aggravates the symptoms. Nothing relieves the symptoms. She has tried nothing for the symptoms.    Past Medical History:  Diagnosis Date  . Anxiety   . Arthritis   . Breast cancer ([place])    left  . Cough   . Esophageal stricture   . GERD (gastroesophageal reflux disease)   . Hyperlipidemia   . Hypertension   . Thrombosed external hemorrhoid, R posterior 01/20/2012  . TIA (transient ischemic attack)     Patient Active Problem List   Diagnosis Date Noted  . Adverse drug reaction 12/25/2017  . Closed pelvic fracture ([place]) 12/18/2017  . Closed fracture of multiple pubic rami ([place]) 12/18/2017  . CKD (chronic kidney disease) stage 3, GFR 30-59 ml/min (HCC) 12/18/2017  . Internal hemorrhoids with complication with suspected prolapse 02/16/2014  . Cancer of upper-outer quadrant of female breast ([place]) 06/16/2012  . Hemorrhoids, internal, thrombosed, Left lateral 02/10/2012  . CONSTIPATION 04/18/2008  . HYPERLIPIDEMIA 04/15/2008  . Essential hypertension 04/15/2008  . TRANSIENT ISCHEMIC ATTACK 04/15/2008  . ESOPHAGEAL STRICTURE 04/15/2008  . GERD 04/15/2008  . ARTHRITIS 04/15/2008    Past Surgical History:  Procedure Laterality Date  . ABDOMINAL HYSTERECTOMY    . BREAST LUMPECTOMY  2010   left  . CHOLECYSTECTOMY  2002  . CYSTECTOMY  in her 20's  . HEEL SPUR SURGERY     right  . MASTECTOMY, PARTIAL     left  . TONSILLECTOMY  OB History   None      Home Medications    Prior to Admission medications   Medication Sig Start Date End Date Taking? Authorizing Provider  acetaminophen (TYLENOL) 325 MG tablet Take 650 mg by mouth every 6 (six) hours as needed for mild pain.    [provider]  ALPRAZolam Duanne Moron) 0.5 MG tablet Take 0.5 mg by mouth 2 (two) times daily as needed for anxiety (two weeks).    [provider]  amitriptyline (ELAVIL) 50 MG tablet Take 1 tablet (50 mg total) by mouth at bedtime as  needed for sleep. 01/09/18   Medina-Vargas, Monina C, NP  atenolol (TENORMIN) 50 MG tablet Take 1 tablet (50 mg total) by mouth daily. 01/09/18   Medina-Vargas, Monina C, NP  Cholecalciferol (VITAMIN D3) 2000 UNITS TABS Take 1 tablet by mouth daily.     [provider]  ciprofloxacin (CIPRO) 250 MG tablet Take 250 mg by mouth 2 (two) times daily. 06/29/18   [provider]  felodipine (PLENDIL) 10 MG 24 hr tablet Take 1 tablet (10 mg total) by mouth daily. 01/09/18   Medina-Vargas, Monina C, NP  furosemide (LASIX) 40 MG tablet Take 1 tablet (40 mg total) by mouth daily. 01/09/18   Medina-Vargas, Monina C, NP  galantamine (RAZADYNE ER) 16 MG 24 hr capsule Take 16 mg by mouth daily. 05/21/18   [provider]  potassium chloride SA (K-DUR,KLOR-CON) 20 MEQ tablet Take 1 tablet (20 mEq total) by mouth daily. 01/09/18   Medina-Vargas, Monina C, NP  psyllium (METAMUCIL SMOOTH TEXTURE) 28 % packet Take 1 packet by mouth 2 (two) times daily. 02/16/14   Stark Klein, MD    Family History Family History  Problem Relation Age of Onset  . Lung cancer Brother   . Cervical cancer Sister   . Hypertension Father   . Colon cancer Neg Hx   . Esophageal cancer Neg Hx   . Rectal cancer Neg Hx   . Stomach cancer Neg Hx     Social History Social History   Tobacco Use  . Smoking status: Never Smoker  . Smokeless tobacco: Never Used  Substance Use Topics  . Alcohol use: No  . Drug use: No     Allergies   Hydrocodone; Ibandronate sodium; Latex; Sulfonamide derivatives; and Tessalon [benzonatate]   Review of Systems Review of Systems  Respiratory: Negative for shortness of breath.   Cardiovascular: Negative for chest pain.  Gastrointestinal: Negative for abdominal pain.  Neurological: Negative for headaches.  All other systems reviewed and are negative.    Physical Exam Updated Vital Signs BP (!) 193/81 (BP Location: Right Arm)   Pulse (!) 59   Temp 98.1 F (36.7 C) (Oral)    Resp 17   SpO2 98%   Physical Exam  Constitutional: She is oriented to person, place, and time. She appears well-developed and well-nourished. No distress.  HENT:  Head: Normocephalic and atraumatic.  Mouth/Throat: Oropharynx is clear and moist.  Eyes: Pupils are equal, round, and reactive to light. Conjunctivae and EOM are normal.  Neck: Normal range of motion. Neck supple.  Cardiovascular: Normal rate, regular rhythm and normal heart sounds.  Pulmonary/Chest: Effort normal and breath sounds normal. No respiratory distress.  Abdominal: Soft. She exhibits no distension. There is no tenderness.  Musculoskeletal: Normal range of motion. She exhibits no edema or deformity.  Neurological: She is alert and oriented to person, place, and time.  Skin: Skin is warm and dry.  Psychiatric:  She has a normal mood and affect.  Nursing note and vitals reviewed.    ED Treatments / Results  Labs (all labs ordered are listed, but only abnormal results are displayed) Labs Reviewed  BASIC METABOLIC PANEL - Abnormal; Notable for the following components:      Result Value   CO2 21 (*)    Glucose, Bld 106 (*)    Creatinine, Ser 1.24 (*)    GFR calc non Af Amer 37 (*)    GFR calc Af Amer 43 (*)    All other components within normal limits  CBC WITH DIFFERENTIAL/PLATELET  URINALYSIS, ROUTINE W REFLEX MICROSCOPIC    EKG None  Radiology No results found.  Procedures Procedures (including critical care time)  Medications Ordered in ED Medications - No data to display   Initial Impression / Assessment and Plan / ED Course  I have reviewed the triage vital signs and the nursing notes.  Pertinent labs & imaging results that were available during my care of the patient were reviewed by me and considered in my medical decision making (see chart for details).     MDM  Screen complete  Patient is presenting for evaluation of increased agitation and unsafe behaviors.  Further history is  obtained by the son who reports increasing agitation and unsafe behaviors at home.  Patient is not safe at home per the son's report.  Social work and case management have already been consulted. Son of patient does not feel that she is safe to be taken home.  Patient is without evidence of acute medical process that would require admission.  Patient is medically clear at this time for further psychiatric evaluation.  Patient requires psychiatric evaluation to determine competency and determine whether the patient is safe to be discharged.   Final Clinical Impressions(s) / ED Diagnoses   Final diagnoses:  Dementia with behavioral disturbance, unspecified dementia type [facility])    ED Discharge Orders    None       Valarie Merino, MD 07/03/18 1553

## 2018-07-03 NOTE — Progress Notes (Addendum)
2nd shift ED CSW received a handoff from the 1st shift [facility] ED CSW.   CSW met with pt's son who reiterated what the CSW was told by the 1st shift ED CSW and the 1st shift [facility] ED RN CM.  Per the RN CM the pt was stating he "could not take the pt home".  CSW provded active listening and validated the pt's son's emotions and feelings of frustration at pt's possible D/C.  CSW reviewed process that was beginning with the pt's TTS consult and went over the options with the pt's son should pt be kept overnight for obseration, referred for inpatient psychiatric treatment, or cleared and sent home.  Pt's son is now aware that decisions regarding the pt's disposition are made almost wholly based upon the pt's CURRENT presentation at the time of the assessment and if the pt's presentation to staff changes the results of the next assessment can change.  At times the pt's son expressed frustration and angst at the possibility of the pt being D/C'd due to the pt, per the pt's son, being actively aggressive at home, attempting to leave home late at night, with or without the pt's son and pt's husband knowing.  Pts' son states pt fell and while ot's husband tried to keep the pt from falling the pt's husband who is 8 yo fell also.  Pt's son states pt steps off of her porch in a a dangerous manner and in general takes an emotional toll on the health of the pt's 82 yo husband, rarely allowing him to rest or sleep.   In addition, the pt's son, the pt and the pt's husband lives in the home with the pt's daughter-in-law who is nonambulatory and has alztheimers.    Per the CSW handoff and the pt's son, the 1st shift ED CSW counseled pt's daughter on ALF's and Memory Care, how they work, who is qualified to go there, insurances that do and don't pay for them and how it is paid for otherwise.  1st shift CSW provided pt's son with a list of Assisted Living Facilities in the [place] which may have Memory Care  facilities.  This Probation officer (2nd shift ED CSW) counseled pt on legal guardianship and how it is sometimes attained and how regardless of whether the pt's son paid for a Memory Care Unit or had the pt apply for an receive Medicaid which would pay for a memory care unit, the pt could still refuses to go.  Thus, the CSW continually brought the pt's son back to a conversation about how to request a guardianship hearing and apply for guardianship and/or request the assistance of DSS/APS and receiving expedited guardianship (interim guardian ship) so that the pt could be signed into the facility of the pt's son's choice.  CSW encouraged the pt's son to file an APS report and request guidance and assistance with gaining guardianship and then explained to the pt's son that as long as the pt was in the hospital APS will not move forward with assisting the pt's son with guardianship and that if the pt is in the hospital the pt would be considered to be in a safe place and perhaps APS would arrive to the hospital to conduct an assessment, but no action would be likely to be taken by APS until the pt D/C's and is considered to be harmful to herself or others by APS.  CSW counseled pt's son on contacting APS and how to file a report  and CSW provided pt's son with the after-hours APS hotline.  8:05 PM CSW was told by TTS that pt would remain overnight for observation and would be assessed for "possible" geropsych inpatient hospitalization, but pt's son was counseled that if pt presented in the morning and no longer displayed criteria necessary for a referral to geropsyche that it is likely that pt will be D/C's.  Pt's son continually would give examples of what the pt had done at home that prove the pt would be harmful to herself or others and the CSW would redirect the pt's son back to the fact that what is documented at the ED only, can be utilized in regards to the pt's disposition from the ED and that the pt's son's  main focus should be on guardianship being obtained by the pt's son so that even if the pt is referred to a geropsyche inpatient unit, the pt will eventually D/C and be right back home and could eventually decompensate again.  CSW counseled pt that is pt still presenting as appropriate for geropsyche placement then that could proceed but pt's son was counseled this assessment of appropriateness for geropsyche placement can vary from day to day.  Pt's son had to be continually redirected from giving past examples of the pt's AVH.  CSW later found out via TTS/NP that pt was to be kept overnight for observation to be-re-assessed in the morning and CSW counseled pt's son to be prepared for pt's D/C should pt present as A&OX4 on 9/28 and that the pt being D/C'd and the APS report having been filed by the pt's son and then APS updated by the 1st shift ED CSW on 9/28 that the pt was D/C'd will hopefully begin the process where APS/DSS assists the pt's son with the pt's son obtaining guardianship.   Pt's son stated he would go home and rest and CSW stated that the pt's son would be contacted by the [facility] ED in the morning and that it would likely be by the 1st shift ED CSW on 9/28.  Pt's son was appreciative and thanked the CSW.  2nd shift ED CSW will leave handoff for 1st shift ED CSW.  Please reconsult if future social work needs arise.  CSW signing off, as social work intervention is no longer needed.  Alphonse Guild. Radiance Deady, LCSW, LCAS, CSI Clinical Social Worker Ph: [phone]

## 2018-07-03 NOTE — Consult Note (Signed)
   [facility] CM Inpatient Consult   07/03/2018  [name] [birth year] [id number]    Received call from ED [facility] LCSW regarding potential [facility] Care Management services for community resources and assistance with long term care planning.  Discussed that writer will have to call son via phone to discuss [place] Management services.  Will await to call at later time since patient is currently being treated in the ED. Will allow ED staff to perform, care and treat before writer contacting son Shanon Brow about [place] Management.   Will continue to follow for disposition planning and engage for services when appropriate.   [name], MSN-Ed, RN,BSN [facility] Liaison [phone]

## 2018-07-03 NOTE — ED Notes (Signed)
This Probation officer spoke to MD Tegler regarding continued HTN. MD will order patients home meds.

## 2018-07-03 NOTE — ED Notes (Signed)
Per Dr. Sherry Ruffing, IVC is in progress.

## 2018-07-03 NOTE — ED Notes (Signed)
No urine noted in pure wick at this time  

## 2018-07-03 NOTE — ED Notes (Signed)
Patient stated that she was seeing worms crawling on the floor and that a man was trying to open the door on the other side. Pt was reassured that their were no worms crawling on the floor and that no one was trying to get into the room. This patient showed no signs of aggression but did seem anxious about what she saw in the room.

## 2018-07-03 NOTE — Progress Notes (Signed)
CSW received consult for resources regarding placement for pt. CSW spoke with pt's son Shanon Brow via phone and was informed that pt is living at home with husband, son , and daughter in law. Son reports that pt is becoming aggressive at times and making it a challenge to ensure that pt's needs are met. Rosana Hoes expressed to CSW that he is interested in placing pt but needs resources on how to move further in ensuring that thi happens. CSW explained Shanon Brow that from report of him it sounds as if pt would be better served at a [place] facility as opposed to a SNF. CSW explained that CSW's are unable to place pt's into Memory Care facilties form the ED as the process can become lengthy at time. CSW did inform Shanon Brow of qualifications for [place] placement such as applyign for Medicaid or paying out of pocket for this service. CSW informed Shanon Brow that CSW would send over Memory Care resoruces for pt and allow Shanon Brow to follow up with facility of choice regarding pt. Shanon Brow agreeable and informed CSW that he would be at the ED to se ept at 2pm or after.    CSW has spoken with Orrin Brigham from [facility] as well as Norberto Sorenson at [facility] ED and both are working with son and family to ensure that services can be provided soon to meet pt's immediate needs. At this time CSW has faxed over Memory Care resources to [phone]. There are no further CSW needs warranted at this time. CSW will sign off.    Virgie Dad. Layken Beg, MSW, [place] [facility] Clinical Social Worker [phone]

## 2018-07-03 NOTE — Care Management Note (Signed)
Case Management Note  Patient Details  Name: [name] MRN: [id number] Date of Birth: 1926-09-14  9:30 CM noted pt's complaint and consulted CSW for collaboration on transiton of care planning.  13:15 CSW consulted CM for [facility] services.  CM advised son will be at [facility] ED around 14:00.  CM will speak with pt and son about [facility] agency choice at that time.  Contacted Dr. Francia Greaves for [facility] orders.  15:15 CM spoke with pt and son at bedside.  Pt had difficulty understanding due to hearing loss.  It was unclear how much she could not follow due to hearing loss vs. dementia.  CM discussed the role of HH.  Son discussed current issues at home and advised that he would not be able to take her home due to her own safety and that of his fathers.  Pt attempted to pack a bag last night and leave their home because she "had to go" with an unclear destination or reason.  She physically exhausted herself and her husband but mentally "had to go."  She became so weak she could not stand and her husband tried to hold her up, yelling for help until their son came to help, per son.  CM requested to speak to son outside of the room so as to not continue discussing the pt as if she was not present.  Son was not agreeable to [facility] and continued to request placement for safety.  CM asked if he had spoken with a provider her in the ED about the pt's behavior at home.  He had not.  CM advised that if he could pay privately for a ALF memory care unit pt could potentially go to one today if pt was agreeable, a bed was available, and paperwork could be completed in time.  Son reports she would refuse.  CM discussed legal guardianship and APS.  Son, Shanon Brow, has financial and medical POA but not legal guardianship.  Discussed process of every aspect involved in the LTC placement in her situation.  He continued to say he would not be able to take her home.  CM advised him that if she remained in the ED while waiting for this process, she  would likely continue to decline due to the ED environment.  CM discussed conversation with Dr. Francia Greaves and suggested a TTS consult for multiple reasons.  Updated ED CSW.  Advised CSW if pt is transitioned home, the name of a [facility] agency is still needed for referral.  Expected Discharge Date:   07/03/2018               Expected Discharge Plan:  [place]  In-House Referral:  Clinical Social Work  Discharge planning Services  CM Consult  Post Acute Care Choice:    Choice offered to:  Adult Children, Patient  HH Arranged:  RN, PT, OT, Nurse's Aide, Speech Therapy, Social Work [employer] Agency:     Status of Service:  Completed, signed off  Hal Neer, Benjaman Lobe, RN 07/03/2018, 1:19 PM

## 2018-07-03 NOTE — ED Notes (Signed)
Dr. Gustavus Messing in dept.  Pt is hitting @ staff, ripped her fall risk bracelet causing a skin tear to left forearm.

## 2018-07-03 NOTE — BH Assessment (Signed)
[facility] Assessment Progress Note  Case was staffed with Parks FNP who recommended a inpatient admission (Geropsychiatry) to assist with stabilization.     

## 2018-07-03 NOTE — ED Notes (Signed)
Pt pulling on armband causing another skin tear to right forearm.  Non-stick dressing applied to right forearm and wrapped with coban.  Left forearm redressed with non-stick dressing & wrapped with coban.

## 2018-07-03 NOTE — ED Notes (Signed)
Bed: WA06 Expected date:  Expected time:  Means of arrival:  Comments: EMS 84 yo female.

## 2018-07-03 NOTE — ED Notes (Signed)
Pt given water 

## 2018-07-03 NOTE — BH Assessment (Signed)
Assessment Note  [name] is an 84 y.o. female that presents this date with altered mental state and increased agitation (per notes ) at home. Patient denies any S/I, H/I or AVH. Patient per notes resides with husband and son Brantleigh Mifflin) who per notes is patient's POA. Patient provides limited history due to current mental state and renders limited history. Per notes, "Patient presents from home with complaints of increased agitation and aggressive behavior. Per history patient has a history of dementia and has been trying to leave the home". Per [facility] LCSWA note, "CSW received consult for resources regarding placement for patient. CSW spoke with patient's son Shanon Brow by phone and was informed that patient is living at home with husband,son and daughter in [place]. Son reports that patient is becoming aggressive at times and making it a challenge to ensure that patient's needs are met. CSW did inform patient's son of qualifications for [facility] placement such as applying for Medicaid or paying out of pocket for this service. CSW informed patient's son that CSW would send over Memory Care resources for patient and allow patient's son to follow up with facility of choice regarding patient". Per EDP notes, "Patient presents for evaluation of agitation. Patient arrives by EMS from her home. She lives at home with her husband, her son, and her daughter-in-law. The son is reportedly the primary caregiver. Patient has a history of dementia. Per EMS, patient has had increased agitation over the last 2 to 3 days. Additional history obtained from the patient's son upon his arrival to the ED. He reports increasing agitation and unsafe behaviors at home. The patient has reportedly tried to leave the house in the middle the night. She has had difficulty recognizing her surroundings or family. She has been aggressive and agitated at times. The son feels that she is not safe at home. He feels that she belongs in a facility  where her behaviors can be closely monitored". Case was staffed with Romilda Garret FNP who recommended a inpatient admission (Geropsychiatry) to assist with stabilization.   Diagnosis: F43.25 Adjustment disorder with mixed disturbance of emotions and conduct    Past Medical History:  Past Medical History:  Diagnosis Date  . Anxiety   . Arthritis   . Breast cancer ([place])    left  . Cough   . Esophageal stricture   . GERD (gastroesophageal reflux disease)   . Hyperlipidemia   . Hypertension   . Thrombosed external hemorrhoid, R posterior 01/20/2012  . TIA (transient ischemic attack)     Past Surgical History:  Procedure Laterality Date  . ABDOMINAL HYSTERECTOMY    . BREAST LUMPECTOMY  2010   left  . CHOLECYSTECTOMY  2002  . CYSTECTOMY  in her 65's  . HEEL SPUR SURGERY     right  . MASTECTOMY, PARTIAL     left  . TONSILLECTOMY      Family History:  Family History  Problem Relation Age of Onset  . Lung cancer Brother   . Cervical cancer Sister   . Hypertension Father   . Colon cancer Neg Hx   . Esophageal cancer Neg Hx   . Rectal cancer Neg Hx   . Stomach cancer Neg Hx     Social History:  reports that she has never smoked. She has never used smokeless tobacco. She reports that she does not drink alcohol or use drugs.  Additional Social History:  Alcohol / Drug Use Pain Medications: See MAR Prescriptions: See MAR Over the  Counter: See MAR History of alcohol / drug use?: No history of alcohol / drug abuse Longest period of sobriety (when/how long): NA Negative Consequences of Use: (NA) Withdrawal Symptoms: (NA)  CIWA: CIWA-Ar BP: (!) 172/130 Pulse Rate: 82 COWS:    Allergies:  Allergies  Allergen Reactions  . Hydrocodone     See 12/24/17 ED note visual hallucinations, combativeness, and recurrent falling on hydrocodone   . Ibandronate Sodium     REACTION: joint aches  . Latex     unsure  . Sulfonamide Derivatives Hives  . Tessalon [Benzonatate]     Fast heart  rate, kept pt awake    Home Medications:  (Not in a hospital admission)  OB/GYN Status:  No LMP recorded. Patient has had a hysterectomy.  General Assessment Data [facility] of Assessment: [facility] TTS Assessment: In system Is this a Tele or Face-to-Face Assessment?: Face-to-Face Is this an Initial Assessment or a Re-assessment for this encounter?: Initial Assessment Patient Accompanied by:: (Son) Language Other than English: No Living Arrangements: Other (Comment)(With family) What gender do you identify as?: Female Marital status: Married Pharmacist, community name: [place] Pregnancy Status: No Living Arrangements: Other relatives Can pt return to current living arrangement?: Yes Admission Status: Voluntary Is patient capable of signing voluntary admission?: No Referral Source: Self/Family/Friend Insurance type: Healthcare Advantage(Per hx)  Medical Screening Exam [facility] Walk-in ONLY) Medical Exam completed: Yes  Crisis Care Plan Living Arrangements: Other relatives Name of Psychiatrist: [facility] Name of Therapist: None  Education Status Is patient currently in school?: No Is the patient employed, unemployed or receiving disability?: Unemployed  Risk to self with the past 6 months Suicidal Ideation: No Has patient been a risk to self within the past 6 months prior to admission? : No Suicidal Intent: No Has patient had any suicidal intent within the past 6 months prior to admission? : No Is patient at risk for suicide?: No, but patient needs Medical Clearance Suicidal Plan?: No Has patient had any suicidal plan within the past 6 months prior to admission? : No Access to Means: No What has been your use of drugs/alcohol within the last 12 months?: Denies Previous Attempts/Gestures: No How many times?: 0 Other Self Harm Risks: Altered mental state Triggers for Past Attempts: (NA) Intentional Self Injurious Behavior: None Family Suicide History: No Recent stressful life event(s): (Increased  agitation altered mental state) Persecutory voices/beliefs?: No Depression: (UTA) Depression Symptoms: (UTA) Substance abuse history and/or treatment for substance abuse?: No Suicide prevention information given to non-admitted patients: Not applicable  Risk to Others within the past 6 months Homicidal Ideation: No Does patient have any lifetime risk of violence toward others beyond the six months prior to admission? : No Thoughts of Harm to Others: No Current Homicidal Intent: No Current Homicidal Plan: No Access to Homicidal Means: No Identified Victim: NA History of harm to others?: No Assessment of Violence: None Noted Violent Behavior Description: NA Does patient have access to weapons?: No Criminal Charges Pending?: No Does patient have a court date: No Is patient on probation?: No  Psychosis Hallucinations: None noted Delusions: None noted  Mental Status Report Appearance/Hygiene: Unremarkable Eye Contact: Fair Motor Activity: Freedom of movement Speech: Soft, Slow Level of Consciousness: Quiet/awake Mood: Pleasant Affect: Anxious Anxiety Level: Moderate Thought Processes: Thought Blocking Judgement: Partial Orientation: Not oriented Obsessive Compulsive Thoughts/Behaviors: None  Cognitive Functioning Concentration: Unable to Assess Memory: Unable to Assess Is patient IDD: No Insight: Unable to Assess Impulse Control: Unable to Assess Appetite: Fair Have you  had any weight changes? : No Change Sleep: Unable to Assess Total Hours of Sleep: (UTA) Vegetative Symptoms: Unable to Assess  ADLScreening [facility] Assessment Services) Patient's cognitive ability adequate to safely complete daily activities?: Yes Patient able to express need for assistance with ADLs?: Yes Independently performs ADLs?: No  Prior Inpatient Therapy Prior Inpatient Therapy: No  Prior Outpatient Therapy Prior Outpatient Therapy: No Does patient have an ACCT team?: No Does patient  have Intensive In-House Services?  : No Does patient have [facility] services? : No Does patient have P4CC services?: No  ADL Screening (condition at time of admission) Patient's cognitive ability adequate to safely complete daily activities?: Yes Is the patient deaf or have difficulty hearing?: Yes Does the patient have difficulty seeing, even when wearing glasses/contacts?: Yes Does the patient have difficulty concentrating, remembering, or making decisions?: Yes Patient able to express need for assistance with ADLs?: Yes Does the patient have difficulty dressing or bathing?: Yes Independently performs ADLs?: No Communication: Independent Dressing (OT): Needs assistance Is this a change from baseline?: Pre-admission baseline Grooming: Needs assistance Is this a change from baseline?: Pre-admission baseline Feeding: Independent Bathing: Needs assistance Is this a change from baseline?: Pre-admission baseline Toileting: Needs assistance Is this a change from baseline?: Pre-admission baseline In/Out Bed: Needs assistance Is this a change from baseline?: Pre-admission baseline Walks in Home: Needs assistance Is this a change from baseline?: Pre-admission baseline Does the patient have difficulty walking or climbing stairs?: Yes Weakness of Legs: Both Weakness of Arms/Hands: Both  Home Assistive Devices/Equipment Home Assistive Devices/Equipment: None  Therapy Consults (therapy consults require a physician order) PT Evaluation Needed: No OT Evalulation Needed: No SLP Evaluation Needed: No Abuse/Neglect Assessment (Assessment to be complete while patient is alone) Physical Abuse: Denies Verbal Abuse: Denies Sexual Abuse: Denies Exploitation of patient/patient's resources: Denies Self-Neglect: Denies Values / Beliefs Cultural Requests During Hospitalization: None Spiritual Requests During Hospitalization: None Consults Spiritual Care Consult Needed: No Social Work Consult  Needed: No Regulatory affairs officer (For Healthcare) Does Patient Have a Medical Advance Directive?: No Would patient like information on creating a medical advance directive?: No - Patient declined          Disposition: Case was staffed with Romilda Garret FNP who recommended a inpatient admission (Geropsychiatry) to assist with stabilization. Disposition Initial Assessment Completed for this Encounter: Yes Disposition of Patient: Admit Type of inpatient treatment program: Adult Patient refused recommended treatment: No Mode of transportation if patient is discharged?: (Unk)  On Site Evaluation by:   Reviewed with Physician:    Mamie Nick 07/03/2018 5:20 PM

## 2018-07-03 NOTE — ED Notes (Signed)
Pt ambulated to restroom with stand by assist only

## 2018-07-03 NOTE — ED Notes (Signed)
Pt voided pure wick was noted to be wet but no urine noted in suction canister. RN made aware.

## 2018-07-03 NOTE — Consult Note (Signed)
   [facility] CM Inpatient Consult   07/03/2018  [name] [birth year] [id number]    Received call from Jeanette Caprice, ED LCSW requesting [facility] follow up after she spoke with patient's son [name]. [name] remains in ED at [facility].  Telephone call made to [name] at [phone] to discuss [facility] program. [name] is agreeable and verbal consent obtained. He is the primary caregiver. Both patient and her husband live with [name]. [name] states his mother really needs a memory care unit. [name] states he needs to apply for Medicaid. States he has not done so yet. Writer provided address and telephone number to DSS. Also discussed [facility] Social Worker follow up for assistance with navigating the Medicaid process and looking for long term care facilities. [name] is appreciative of assistance.  Made ED RNCM and ED LCSW aware [facility] will follow post discharge.  [name] is primary contact at [phone].  Will make referral to [place] for [employer].   [name], MSN-Ed, RN,BSN [facility] Liaison [phone]

## 2018-07-03 NOTE — ED Notes (Signed)
No urine noted in brief when cleaning up pt. Bladder scan preformed and 650 in bladder.

## 2018-07-03 NOTE — ED Notes (Signed)
Pt encouraged to try to void. Pt stats " I have no urge to." no urine noted in pure wick at this time.

## 2018-07-03 NOTE — ED Provider Notes (Signed)
4:03 PM [name] is a 84 y.o. female with a past medical history significant for hypertension, hyperlipidemia, prior TIA, dementia and currently on antibiotics for recent UTI who presents with aggressive behavior.    At time of transfer of care from Dr. Francia Greaves, patient is awaiting TTS recommendations after she has now been medically cleared.  She was given atenolol which is her normal blood pressure medicine.  Anticipate following up on her TTS recommendations.   8:38 PM TTS recommends observation overnight and to be reassessed in the morning.    Just was called back because she was agitated and being aggressive with staff.  Patient was agreeable to taking oral Haldol.  This was ordered.  Anticipate reassessment after medication.  9:13 PM Patient got more aggressive.  Patient has been punching and kicking staff as well as biting at people.  Patient is nearly falling over when she tries to ambulate.  Patient is disoriented and thinks she is in a rocking chair.    Just spoke with the patient's son who feels this is exactly the behavior she has been demonstrating at home.  She has tried to wander outside at night and he is concerned about dangerous behavior.  He is concerned about her safety with injuring herself or falling as well as injury to others given her aggressiveness and fighting.  As she is demonstrated this test tonight, I feel she is not safe for leaving the emergency department.    IVC paperwork initiated to ensure she see psychiatry with her danger to her self and others at this time.  After IM Haldol, patient calm down and rested calmly.  Anticipate following up on TTS recommendations in morning.     Tegeler, Gwenyth Allegra, MD 07/04/18 [phone]

## 2018-07-03 NOTE — ED Notes (Signed)
Patient is resting comfortably. 

## 2018-07-03 NOTE — ED Notes (Signed)
Pt is wandering, sitter walking with pt.  Attempted to call Dr. Sherry Ruffing for home med orders.  No answer.

## 2018-07-03 NOTE — ED Notes (Signed)
Bed: AD07 Expected date: 07/03/18 Expected time:  Means of arrival:  Comments: Room 6

## 2018-07-03 NOTE — ED Notes (Addendum)
Pt attempting to bite staff.  Refusing po meds.  Dr. Gustavus Messing in dept. Pt is very unsteady.  Pt attempting to leave dept.

## 2018-07-03 NOTE — ED Triage Notes (Signed)
Patient here from home with complaints of increased agitation and aggressive behavior. Reports patient has a history of dementia and has been trying to leave the home. Family would like resources for placement. Son reports that his wife has Alzheimer's and his dad is sick and is unable to care for mother at this time.

## 2018-07-03 NOTE — ED Notes (Signed)
Mistaken entry - wrong patient.

## 2018-07-03 NOTE — ED Notes (Signed)
Pt has pure wick in place to obtain urine specimen.

## 2018-07-03 NOTE — ED Notes (Signed)
Still not urine noted in pure wick

## 2018-07-03 NOTE — ED Notes (Signed)
Pt continues to fight staff by kicking & hitting.

## 2018-07-03 NOTE — ED Notes (Signed)
Pt son requested assistance. Upon entering EDRm 6 pt was OOB, has pulled of BP cuff and Pulse Ox, trying to straighten sheets/blankets on bed. Pt checked- brief is dry; no complaints/no bruises. Pt assisted back into bed, provided warm blankets, head of bed dropped w/feet elevated, placed on monitor. Son asked for event to be documented for social work/case mgmt purposes. RN advised. [employer]

## 2018-07-04 ENCOUNTER — Other Ambulatory Visit: Payer: Self-pay

## 2018-07-04 ENCOUNTER — Emergency Department (HOSPITAL_COMMUNITY): Payer: PPO

## 2018-07-04 DIAGNOSIS — R4689 Other symptoms and signs involving appearance and behavior: Secondary | ICD-10-CM | POA: Diagnosis present

## 2018-07-04 DIAGNOSIS — R4589 Other symptoms and signs involving emotional state: Secondary | ICD-10-CM | POA: Diagnosis not present

## 2018-07-04 DIAGNOSIS — Z046 Encounter for general psychiatric examination, requested by authority: Secondary | ICD-10-CM | POA: Diagnosis not present

## 2018-07-04 MED ORDER — AMITRIPTYLINE HCL 25 MG PO TABS: 50.0000 mg | ORAL_TABLET | Freq: Every evening | ORAL | Status: DC | PRN

## 2018-07-04 MED ORDER — ACETAMINOPHEN 325 MG PO TABS: 650.0000 mg | ORAL_TABLET | Freq: Four times a day (QID) | ORAL | Status: DC | PRN

## 2018-07-04 MED ORDER — ZIPRASIDONE MESYLATE 20 MG IM SOLR
10.0000 mg | Freq: Once | INTRAMUSCULAR | Status: AC | PRN
  Administered 2018-07-04: 10 mg via INTRAMUSCULAR
  Filled 2018-07-04: qty 20

## 2018-07-04 MED ORDER — QUETIAPINE FUMARATE 25 MG PO TABS
25.0000 mg | ORAL_TABLET | Freq: Every day | ORAL | Status: DC
  Administered 2018-07-04 – 2018-07-05 (×2): 25 mg via ORAL
  Filled 2018-07-04 (×2): qty 1

## 2018-07-04 MED ORDER — LORAZEPAM 2 MG/ML IJ SOLN
1.0000 mg | Freq: Once | INTRAMUSCULAR | Status: AC
  Administered 2018-07-04: 1 mg via INTRAMUSCULAR
  Filled 2018-07-04: qty 1

## 2018-07-04 MED ORDER — ATENOLOL 50 MG PO TABS
50.0000 mg | ORAL_TABLET | Freq: Every day | ORAL | Status: DC
  Administered 2018-07-04 – 2018-07-05 (×2): 50 mg via ORAL
  Filled 2018-07-04 (×3): qty 1

## 2018-07-04 MED ORDER — POTASSIUM CHLORIDE CRYS ER 20 MEQ PO TBCR
20.0000 meq | EXTENDED_RELEASE_TABLET | Freq: Every day | ORAL | Status: DC
  Administered 2018-07-04 – 2018-07-06 (×3): 20 meq via ORAL
  Filled 2018-07-04 (×3): qty 1

## 2018-07-04 MED ORDER — GALANTAMINE HYDROBROMIDE ER 8 MG PO CP24
16.0000 mg | ORAL_CAPSULE | Freq: Every day | ORAL | Status: DC
  Administered 2018-07-05 – 2018-07-06 (×2): 16 mg via ORAL
  Filled 2018-07-04 (×3): qty 2

## 2018-07-04 MED ORDER — HALOPERIDOL LACTATE 5 MG/ML IJ SOLN
2.5000 mg | Freq: Once | INTRAMUSCULAR | Status: AC
  Administered 2018-07-04: 2.5 mg via INTRAMUSCULAR
  Filled 2018-07-04: qty 1

## 2018-07-04 MED ORDER — FELODIPINE ER 10 MG PO TB24
10.0000 mg | ORAL_TABLET | Freq: Every day | ORAL | Status: DC
  Administered 2018-07-04 – 2018-07-05 (×2): 10 mg via ORAL
  Filled 2018-07-04 (×3): qty 1

## 2018-07-04 MED ORDER — FUROSEMIDE 40 MG PO TABS
40.0000 mg | ORAL_TABLET | Freq: Every day | ORAL | Status: DC
  Administered 2018-07-05 – 2018-07-06 (×2): 40 mg via ORAL
  Filled 2018-07-04 (×2): qty 1

## 2018-07-04 MED ORDER — CIPROFLOXACIN HCL 500 MG PO TABS
250.0000 mg | ORAL_TABLET | Freq: Two times a day (BID) | ORAL | Status: DC
  Administered 2018-07-04 – 2018-07-06 (×4): 250 mg via ORAL
  Filled 2018-07-04 (×4): qty 1

## 2018-07-04 MED ORDER — STERILE WATER FOR INJECTION IJ SOLN
INTRAMUSCULAR | Status: AC
  Administered 2018-07-04: 1 mL
  Filled 2018-07-04: qty 10

## 2018-07-04 NOTE — Progress Notes (Signed)
CSW contacted patient's son, [name] ([phone]), to update patient's disposition and current TTS recommendations. [name] made aware attending psychiatrist recommending gero-psych admission. CSW informed [name] that patient has been referred out and is waiting for bed offers. [name] agreeable to plan and thanked CSW for update.  CSW continuing to follow.   Stephanie Acre, [place] Social Worker [phone]

## 2018-07-04 NOTE — ED Notes (Signed)
Side rails padded with seizure pads for safety d/t pt hitting/kicking rails.

## 2018-07-04 NOTE — Progress Notes (Addendum)
This patient continues to meet inpatient criteria. CSW faxed information to the following facilities:   [facility] - NO BEDS AVAILABLE 09/28 [facility] 09/28 [facility] - DENIED DUE TO DEMENTIA DIAGNOSIS 09/28 [facility] - NO BEDS [facility] - BEDS AVAILABLE (09/28) BUT PATIENT HAS NOT BEEN REVIEWED AT [facility] -NO GERO BEDS 09/28  Stephanie Acre, [place] Social Worker [phone]

## 2018-07-04 NOTE — ED Notes (Signed)
Pt attempting to kick staff.  Pt assisted to BR by RN & sitter.

## 2018-07-04 NOTE — Consult Note (Addendum)
[facility] Consult   Reason for Consult:  Aggressive behavior Referring Physician:  EDP Patient Identification: [name] MRN:  [id number] Principal Diagnosis: Aggressive behavior of adult Diagnosis:   Patient Active Problem List   Diagnosis Date Noted  . Adverse drug reaction [T50.905A] 12/25/2017  . Closed pelvic fracture ([place]) [S32.9XXA] 12/18/2017  . Closed fracture of multiple pubic rami ([place]) [S32.599A] 12/18/2017  . CKD (chronic kidney disease) stage 3, GFR 30-59 ml/min (HCC) [N18.3] 12/18/2017  . Internal hemorrhoids with complication with suspected prolapse [K64.8] 02/16/2014  . Cancer of upper-outer quadrant of female breast ([place]) [C50.419] 06/16/2012  . Hemorrhoids, internal, thrombosed, Left lateral [K64.5] 02/10/2012  . CONSTIPATION [K59.00] 04/18/2008  . HYPERLIPIDEMIA [E78.5] 04/15/2008  . Essential hypertension [I10] 04/15/2008  . TRANSIENT ISCHEMIC ATTACK [G45.9] 04/15/2008  . ESOPHAGEAL STRICTURE [K22.2] 04/15/2008  . GERD [K21.9] 04/15/2008  . ARTHRITIS [M12.9] 04/15/2008    Total Time spent with patient: 30 minutes  Subjective:   [name] is a 84 y.o. female patient admitted with aggressive behavior.  HPI:  Pt was seen and chart reviewed with treatment team and Dr Dwyane Dee. Pt is a 84 year-old female who [facility] tot [facility] form home, yesterday for aggressive behavior. Pt and her husband live with their son and his wife. Per chart review: Pt was very agitated and aggressive in the emergency room last night and had to be restrained and have padding put on the rails of the bed due to kicking and hitting staff. Pt's son reported she is up all night at home and keeps trying to leave the house. Pt would benefit from an inpatient geriatric psychiatric admission for crisis stabilization and medication management. Will order EKG and Chest x-ray prior to bed placement.   Past Psychiatric History: Unable to determine, Pt is 43 and has  dementia  Risk to Self: Suicidal Ideation: No Suicidal Intent: No Is patient at risk for suicide?: No, but patient needs Medical Clearance Suicidal Plan?: No Access to Means: No What has been your use of drugs/alcohol within the last 12 months?: Denies How many times?: 0 Other Self Harm Risks: Altered mental state Triggers for Past Attempts: (NA) Intentional Self Injurious Behavior: None Risk to Others: Homicidal Ideation: No Thoughts of Harm to Others: No Current Homicidal Intent: No Current Homicidal Plan: No Access to Homicidal Means: No Identified Victim: NA History of harm to others?: No Assessment of Violence: None Noted Violent Behavior Description: NA Does patient have access to weapons?: No Criminal Charges Pending?: No Does patient have a court date: No Prior Inpatient Therapy: Prior Inpatient Therapy: No Prior Outpatient Therapy: Prior Outpatient Therapy: No Does patient have an ACCT team?: No Does patient have Intensive In-House Services?  : No Does patient have [facility] services? : No Does patient have P4CC services?: No  Past Medical History:  Past Medical History:  Diagnosis Date  . Anxiety   . Arthritis   . Breast cancer ([place])    left  . Cough   . Esophageal stricture   . GERD (gastroesophageal reflux disease)   . Hyperlipidemia   . Hypertension   . Thrombosed external hemorrhoid, R posterior 01/20/2012  . TIA (transient ischemic attack)     Past Surgical History:  Procedure Laterality Date  . ABDOMINAL HYSTERECTOMY    . BREAST LUMPECTOMY  2010   left  . CHOLECYSTECTOMY  2002  . CYSTECTOMY  in her 20's  . HEEL SPUR SURGERY     right  . MASTECTOMY,  PARTIAL     left  . TONSILLECTOMY     Family History:  Family History  Problem Relation Age of Onset  . Lung cancer Brother   . Cervical cancer Sister   . Hypertension Father   . Colon cancer Neg Hx   . Esophageal cancer Neg Hx   . Rectal cancer Neg Hx   . Stomach cancer Neg Hx    Family  Psychiatric  History: Pt unable to provide mental health information on her family, she is 82 years old with dementia.  Social History:  Social History   Substance and Sexual Activity  Alcohol Use No     Social History   Substance and Sexual Activity  Drug Use No    Social History   Socioeconomic History  . Marital status: Married    Spouse name: Not on file  . Number of children: 1  . Years of education: Not on file  . Highest education level: Not on file  Occupational History  . Occupation: Retired   Scientific laboratory technician  . Financial resource strain: Not on file  . Food insecurity:    Worry: Not on file    Inability: Not on file  . Transportation needs:    Medical: Not on file    Non-medical: Not on file  Tobacco Use  . Smoking status: Never Smoker  . Smokeless tobacco: Never Used  Substance and Sexual Activity  . Alcohol use: No  . Drug use: No  . Sexual activity: Not on file  Lifestyle  . Physical activity:    Days per week: Not on file    Minutes per session: Not on file  . Stress: Not on file  Relationships  . Social connections:    Talks on phone: Not on file    Gets together: Not on file    Attends religious service: Not on file    Active member of club or organization: Not on file    Attends meetings of clubs or organizations: Not on file    Relationship status: Not on file  Other Topics Concern  . Not on file  Social History Narrative   No caffeine    Additional Social History:    Allergies:   Allergies  Allergen Reactions  . Hydrocodone     See 12/24/17 ED note visual hallucinations, combativeness, and recurrent falling on hydrocodone   . Ibandronate Sodium     REACTION: joint aches  . Latex     unsure  . Sulfonamide Derivatives Hives  . Tessalon [Benzonatate]     Fast heart rate, kept pt awake    Labs:  Results for orders placed or performed during the hospital encounter of 07/03/18 (from the past 48 hour(s))  Basic metabolic panel      Status: Abnormal   Collection Time: 07/03/18  9:33 AM  Result Value Ref Range   Sodium 140 135 - 145 mmol/L   Potassium 3.5 3.5 - 5.1 mmol/L   Chloride 107 98 - 111 mmol/L   CO2 21 (L) 22 - 32 mmol/L   Glucose, Bld 106 (H) 70 - 99 mg/dL   BUN 19 8 - 23 mg/dL   Creatinine, Ser 1.24 (H) 0.44 - 1.00 mg/dL   Calcium 9.7 8.9 - 10.3 mg/dL   GFR calc non Af Amer 37 (L) >60 mL/min   GFR calc Af Amer 43 (L) >60 mL/min    Comment: (NOTE) The eGFR has been calculated using the CKD EPI equation. This calculation has  not been validated in all clinical situations. eGFR's persistently <60 mL/min signify possible Chronic Kidney Disease.    Anion gap 12 5 - 15    Comment: Performed at [facility], [place] [address], [place], [place] [address]  CBC with Differential     Status: None   Collection Time: 07/03/18  9:33 AM  Result Value Ref Range   WBC 7.8 4.0 - 10.5 K/uL   RBC 4.56 3.87 - 5.11 MIL/uL   Hemoglobin 13.3 12.0 - 15.0 g/dL   HCT 39.8 36.0 - 46.0 %   MCV 87.3 78.0 - 100.0 fL   MCH 29.2 26.0 - 34.0 pg   MCHC 33.4 30.0 - 36.0 g/dL   RDW 13.3 11.5 - 15.5 %   Platelets 241 150 - 400 K/uL   Neutrophils Relative % 81 %   Neutro Abs 6.3 1.7 - 7.7 K/uL   Lymphocytes Relative 11 %   Lymphs Abs 0.9 0.7 - 4.0 K/uL   Monocytes Relative 8 %   Monocytes Absolute 0.6 0.1 - 1.0 K/uL   Eosinophils Relative 0 %   Eosinophils Absolute 0.0 0.0 - 0.7 K/uL   Basophils Relative 0 %   Basophils Absolute 0.0 0.0 - 0.1 K/uL    Comment: Performed at [facility], [place] [address], [place], [place] [address]  Urinalysis, Routine w reflex microscopic     Status: Abnormal   Collection Time: 07/03/18  2:43 PM  Result Value Ref Range   Color, Urine YELLOW YELLOW   APPearance CLEAR CLEAR   Specific Gravity, Urine 1.013 1.005 - 1.030   pH 6.0 5.0 - 8.0   Glucose, UA NEGATIVE NEGATIVE mg/dL   Hgb urine dipstick NEGATIVE NEGATIVE   Bilirubin Urine NEGATIVE NEGATIVE    Ketones, ur NEGATIVE NEGATIVE mg/dL   Protein, ur NEGATIVE NEGATIVE mg/dL   Nitrite NEGATIVE NEGATIVE   Leukocytes, UA NEGATIVE NEGATIVE   RBC / HPF 0-5 0 - 5 RBC/hpf   WBC, UA 0-5 0 - 5 WBC/hpf   Bacteria, UA FEW (A) NONE SEEN   Squamous Epithelial / LPF 0-5 0 - 5   Mucus PRESENT    Hyaline Casts, UA PRESENT    Non Squamous Epithelial 0-5 (A) NONE SEEN    Comment: Performed at [facility], [place] [address] [place], [place] [address]    Current Facility-Administered Medications  Medication Dose Route Frequency Provider Last Rate Last Dose  . atenolol (TENORMIN) tablet 50 mg  50 mg Oral Daily Valarie Merino, MD   50 mg at 07/04/18 0349   Current Outpatient Medications  Medication Sig Dispense Refill  . acetaminophen (TYLENOL) 325 MG tablet Take 650 mg by mouth every 6 (six) hours as needed for mild pain.    Marland Kitchen ALPRAZolam (XANAX) 0.5 MG tablet Take 0.5 mg by mouth 2 (two) times daily as needed for anxiety.     Marland Kitchen amitriptyline (ELAVIL) 50 MG tablet Take 1 tablet (50 mg total) by mouth at bedtime as needed for sleep. 30 tablet 0  . atenolol (TENORMIN) 50 MG tablet Take 1 tablet (50 mg total) by mouth daily. 30 tablet 0  . Cholecalciferol (VITAMIN D3) 2000 UNITS TABS Take 1 tablet by mouth daily.     . ciprofloxacin (CIPRO) 250 MG tablet Take 250 mg by mouth 2 (two) times daily.    . felodipine (PLENDIL) 10 MG 24 hr tablet Take 1 tablet (10 mg total) by mouth daily. 30 tablet 0  . furosemide (  LASIX) 40 MG tablet Take 1 tablet (40 mg total) by mouth daily. 30 tablet 0  . galantamine (RAZADYNE ER) 16 MG 24 hr capsule Take 16 mg by mouth daily with breakfast.     . potassium chloride SA (K-DUR,KLOR-CON) 20 MEQ tablet Take 1 tablet (20 mEq total) by mouth daily. 30 tablet 0  . psyllium (METAMUCIL SMOOTH TEXTURE) 28 % packet Take 1 packet by mouth 2 (two) times daily. 60 packet 3    Musculoskeletal: Strength & Muscle Tone: within normal limits Gait & Station:  normal Patient leans: N/A  Psychiatric Specialty Exam: Physical Exam  Constitutional: She appears well-developed and well-nourished.    Review of Systems  Psychiatric/Behavioral: Positive for memory loss.    Blood pressure (!) 151/85, pulse 73, temperature 98.6 F (37 C), temperature source Axillary, resp. rate (!) 21, SpO2 100 %.There is no height or weight on file to calculate BMI.  General Appearance: Casual  Eye Contact:  Good  Speech:  Blocked and Dementia  Volume:  Normal  Mood:  Euthymic, Pt was aggressive and agitated overnight  Affect:  Congruent  Thought Process:  Disorganized  Orientation:  Other:  UTA, dementia  Thought Content:  UTA, dementia  Suicidal Thoughts:  UTA, dementia  Homicidal Thoughts:  UTA, dementia  Memory:  Immediate;   UTA Dementia Recent;   UTA, dementia Remote;   UTA, dementia  Judgement:  Other:  UTA, dementia  Insight:  UTA, dementia  Psychomotor Activity:  Increased  Concentration:  Concentration: UTA, dementia and Attention Span: UTA, dementia  Recall:  UTA, dementia  Fund of Knowledge:  UTA, dementia  Language:  UTA, dementia  Akathisia:  No  Handed:  Right  AIMS (if indicated):     Assets:  Financial Resources/Insurance Housing  ADL's:  Impaired  Cognition:  Impaired,  Severe  Sleep:        Treatment Plan Summary: Daily contact with patient to assess and evaluate symptoms and progress in treatment and Medication management  Disposition: Recommend psychiatric Inpatient admission when medically cleared.  Ethelene Hal, NP 07/04/2018 12:46 PM

## 2018-07-04 NOTE — ED Notes (Signed)
Pt is restless, no relief of agaitation after [place] Im at 1700. The pt continues to be a high fall risk sitter at the bedside. I will continue to monitor.

## 2018-07-04 NOTE — ED Notes (Signed)
TTS Provider at bedside. 

## 2018-07-05 NOTE — Progress Notes (Addendum)
CSW received call from [facility]. They are currently reviewing the patient have no beds at this time.   [facility] requests the following documents and labs to be ordered and faxed to [phone]. CSW will fax over once the orders are completed:  -Updated vitals -UDS -Chest X-ray -ECG -IVC paperwork     RECORDS FAXED AT 9:40AM  [facility] confirms they received fax and have requested documents.    Stephanie Acre, [place] Social Worker [phone]

## 2018-07-05 NOTE — Progress Notes (Signed)
[facility] called back requesting drug screen results.  Please fax to [phone].  [facility] Confirms that they have received the following: -Vitals -Urinalysis   -Chest X-ray -ECG -IVC paperwork  Stephanie Acre, [place] Worker [phone]

## 2018-07-05 NOTE — Consult Note (Signed)
[facility] Consult   Reason for Consult:  Aggressive behavior Referring Physician:  EDP Patient Identification: [name] MRN:  [id number] Principal Diagnosis: Aggressive behavior of adult Diagnosis:   Patient Active Problem List   Diagnosis Date Noted  . Aggressive behavior of adult [R46.89] 07/04/2018  . Adverse drug reaction [T50.905A] 12/25/2017  . Closed pelvic fracture ([place]) [S32.9XXA] 12/18/2017  . Closed fracture of multiple pubic rami ([place]) [S32.599A] 12/18/2017  . CKD (chronic kidney disease) stage 3, GFR 30-59 ml/min (HCC) [N18.3] 12/18/2017  . Internal hemorrhoids with complication with suspected prolapse [K64.8] 02/16/2014  . Cancer of upper-outer quadrant of female breast ([place]) [C50.419] 06/16/2012  . Hemorrhoids, internal, thrombosed, Left lateral [K64.5] 02/10/2012  . CONSTIPATION [K59.00] 04/18/2008  . HYPERLIPIDEMIA [E78.5] 04/15/2008  . Essential hypertension [I10] 04/15/2008  . TRANSIENT ISCHEMIC ATTACK [G45.9] 04/15/2008  . ESOPHAGEAL STRICTURE [K22.2] 04/15/2008  . GERD [K21.9] 04/15/2008  . ARTHRITIS [M12.9] 04/15/2008    Total Time spent with patient: 30 minutes  Subjective:   [name] is a 84 y.o. female patient admitted with aggressive behavior.  HPI:  Pt was seen and chart reviewed with treatment team and Dr Dwyane Dee. Pt is a 84 year-old female who [facility] tot he [facility] form home, yesterday for aggressive behavior. Pt and her husband live with their son and his wife. Per chart review: Pt was very agitated and aggressive in the emergency room last night and had to be restrained and have padding put on the rails of the bed due to kicking and hitting staff. Pt's son reported she is up all night at home and keeps trying to leave the house. Pt continues to meet inpatient criteria.  Pt would benefit from an inpatient geriatric psychiatric admission for crisis stabilization and medication management. Updated lab, x-ray, and EKG results  were faxed to [facility] today, as requested. Pt is under review but there are no beds at [facility] available today 07-05-2018.   Past Psychiatric History: Unable to determine, Pt is 62 and has dementia  Risk to Self: Suicidal Ideation: No Suicidal Intent: No Is patient at risk for suicide?: No, but patient needs Medical Clearance Suicidal Plan?: No Access to Means: No What has been your use of drugs/alcohol within the last 12 months?: Denies How many times?: 0 Other Self Harm Risks: Altered mental state Triggers for Past Attempts: (NA) Intentional Self Injurious Behavior: None Risk to Others: Homicidal Ideation: No Thoughts of Harm to Others: No Current Homicidal Intent: No Current Homicidal Plan: No Access to Homicidal Means: No Identified Victim: NA History of harm to others?: No Assessment of Violence: None Noted Violent Behavior Description: NA Does patient have access to weapons?: No Criminal Charges Pending?: No Does patient have a court date: No Prior Inpatient Therapy: Prior Inpatient Therapy: No Prior Outpatient Therapy: Prior Outpatient Therapy: No Does patient have an ACCT team?: No Does patient have Intensive In-House Services?  : No Does patient have [facility] services? : No Does patient have P4CC services?: No  Past Medical History:  Past Medical History:  Diagnosis Date  . Anxiety   . Arthritis   . Breast cancer ([place])    left  . Cough   . Esophageal stricture   . GERD (gastroesophageal reflux disease)   . Hyperlipidemia   . Hypertension   . Thrombosed external hemorrhoid, R posterior 01/20/2012  . TIA (transient ischemic attack)     Past Surgical History:  Procedure Laterality Date  . ABDOMINAL HYSTERECTOMY    .  BREAST LUMPECTOMY  2010   left  . CHOLECYSTECTOMY  2002  . CYSTECTOMY  in her 75's  . HEEL SPUR SURGERY     right  . MASTECTOMY, PARTIAL     left  . TONSILLECTOMY     Family History:  Family History  Problem Relation Age  of Onset  . Lung cancer Brother   . Cervical cancer Sister   . Hypertension Father   . Colon cancer Neg Hx   . Esophageal cancer Neg Hx   . Rectal cancer Neg Hx   . Stomach cancer Neg Hx    Family Psychiatric  History: Pt unable to provide mental health information on her family, she is 82 years old with dementia.  Social History:  Social History   Substance and Sexual Activity  Alcohol Use No     Social History   Substance and Sexual Activity  Drug Use No    Social History   Socioeconomic History  . Marital status: Married    Spouse name: Not on file  . Number of children: 1  . Years of education: Not on file  . Highest education level: Not on file  Occupational History  . Occupation: Retired   Scientific laboratory technician  . Financial resource strain: Not on file  . Food insecurity:    Worry: Not on file    Inability: Not on file  . Transportation needs:    Medical: Not on file    Non-medical: Not on file  Tobacco Use  . Smoking status: Never Smoker  . Smokeless tobacco: Never Used  Substance and Sexual Activity  . Alcohol use: No  . Drug use: No  . Sexual activity: Not on file  Lifestyle  . Physical activity:    Days per week: Not on file    Minutes per session: Not on file  . Stress: Not on file  Relationships  . Social connections:    Talks on phone: Not on file    Gets together: Not on file    Attends religious service: Not on file    Active member of club or organization: Not on file    Attends meetings of clubs or organizations: Not on file    Relationship status: Not on file  Other Topics Concern  . Not on file  Social History Narrative   No caffeine    Additional Social History:    Allergies:   Allergies  Allergen Reactions  . Hydrocodone     See 12/24/17 ED note visual hallucinations, combativeness, and recurrent falling on hydrocodone   . Ibandronate Sodium     REACTION: joint aches  . Latex     unsure  . Sulfonamide Derivatives Hives  .  Tessalon [Benzonatate]     Fast heart rate, kept pt awake    Labs:  Results for orders placed or performed during the hospital encounter of 07/03/18 (from the past 48 hour(s))  Urinalysis, Routine w reflex microscopic     Status: Abnormal   Collection Time: 07/03/18  2:43 PM  Result Value Ref Range   Color, Urine YELLOW YELLOW   APPearance CLEAR CLEAR   Specific Gravity, Urine 1.013 1.005 - 1.030   pH 6.0 5.0 - 8.0   Glucose, UA NEGATIVE NEGATIVE mg/dL   Hgb urine dipstick NEGATIVE NEGATIVE   Bilirubin Urine NEGATIVE NEGATIVE   Ketones, ur NEGATIVE NEGATIVE mg/dL   Protein, ur NEGATIVE NEGATIVE mg/dL   Nitrite NEGATIVE NEGATIVE   Leukocytes, UA NEGATIVE NEGATIVE  RBC / HPF 0-5 0 - 5 RBC/hpf   WBC, UA 0-5 0 - 5 WBC/hpf   Bacteria, UA FEW (A) NONE SEEN   Squamous Epithelial / LPF 0-5 0 - 5   Mucus PRESENT    Hyaline Casts, UA PRESENT    Non Squamous Epithelial 0-5 (A) NONE SEEN    Comment: Performed at [facility], [place] [address] [place], [place] [address]    Current Facility-Administered Medications  Medication Dose Route Frequency Provider Last Rate Last Dose  . acetaminophen (TYLENOL) tablet 650 mg  650 mg Oral Q6H PRN Duffy Bruce, MD      . amitriptyline (ELAVIL) tablet 50 mg  50 mg Oral QHS PRN Duffy Bruce, MD      . atenolol (TENORMIN) tablet 50 mg  50 mg Oral Gloriajean Dell, MD   50 mg at 07/04/18 2053  . ciprofloxacin (CIPRO) tablet 250 mg  250 mg Oral BID Duffy Bruce, MD   250 mg at 07/05/18 1059  . felodipine (PLENDIL) 24 hr tablet 10 mg  10 mg Oral QHS Duffy Bruce, MD   10 mg at 07/04/18 2053  . furosemide (LASIX) tablet 40 mg  40 mg Oral Daily Duffy Bruce, MD   40 mg at 07/05/18 1100  . galantamine (RAZADYNE ER) 24 hr capsule 16 mg  16 mg Oral Q breakfast Duffy Bruce, MD   16 mg at 07/05/18 0900  . potassium chloride SA (K-DUR,KLOR-CON) CR tablet 20 mEq  20 mEq Oral Daily Duffy Bruce, MD   20 mEq at 07/05/18 1059   . QUEtiapine (SEROQUEL) tablet 25 mg  25 mg Oral QHS Ethelene Hal, NP   25 mg at 07/04/18 2053   Current Outpatient Medications  Medication Sig Dispense Refill  . acetaminophen (TYLENOL) 325 MG tablet Take 650 mg by mouth every 6 (six) hours as needed for mild pain.    Marland Kitchen ALPRAZolam (XANAX) 0.5 MG tablet Take 0.5 mg by mouth 2 (two) times daily as needed for anxiety.     Marland Kitchen amitriptyline (ELAVIL) 50 MG tablet Take 1 tablet (50 mg total) by mouth at bedtime as needed for sleep. 30 tablet 0  . atenolol (TENORMIN) 50 MG tablet Take 1 tablet (50 mg total) by mouth daily. 30 tablet 0  . Cholecalciferol (VITAMIN D3) 2000 UNITS TABS Take 1 tablet by mouth daily.     . ciprofloxacin (CIPRO) 250 MG tablet Take 250 mg by mouth 2 (two) times daily.    . felodipine (PLENDIL) 10 MG 24 hr tablet Take 1 tablet (10 mg total) by mouth daily. 30 tablet 0  . furosemide (LASIX) 40 MG tablet Take 1 tablet (40 mg total) by mouth daily. 30 tablet 0  . galantamine (RAZADYNE ER) 16 MG 24 hr capsule Take 16 mg by mouth daily with breakfast.     . potassium chloride SA (K-DUR,KLOR-CON) 20 MEQ tablet Take 1 tablet (20 mEq total) by mouth daily. 30 tablet 0  . psyllium (METAMUCIL SMOOTH TEXTURE) 28 % packet Take 1 packet by mouth 2 (two) times daily. 60 packet 3    Musculoskeletal: Strength & Muscle Tone: within normal limits Gait & Station: normal Patient leans: N/A  Psychiatric Specialty Exam: Physical Exam  Constitutional: She appears well-developed and well-nourished.    Review of Systems  Psychiatric/Behavioral: Positive for memory loss.    Blood pressure (!) 165/68, pulse (!) 58, temperature 97.7 F (36.5 C), temperature source Oral, resp. rate 19, SpO2 98 %.There is no  height or weight on file to calculate BMI.  General Appearance: Casual  Eye Contact:  Good  Speech:  Blocked and Dementia  Volume:  Normal  Mood:  Euthymic, Pt was aggressive and agitated overnight- not improving  Affect:   Congruent  Thought Process:  Disorganized  Orientation:  Other:  UTA, dementia  Thought Content:  UTA, dementia  Suicidal Thoughts:  UTA, dementia  Homicidal Thoughts:  UTA, dementia  Memory:  Immediate;   UTA Dementia Recent;   UTA, dementia Remote;   UTA, dementia  Judgement:  Other:  UTA, dementia  Insight:  UTA, dementia  Psychomotor Activity:  Increased  Concentration:  Concentration: UTA, dementia and Attention Span: UTA, dementia  Recall:  UTA, dementia  Fund of Knowledge:  UTA, dementia  Language:  UTA, dementia  Akathisia:  No  Handed:  Right  AIMS (if indicated):     Assets:  Financial Resources/Insurance Housing  ADL's:  Impaired  Cognition:  Impaired,  Severe  Sleep:   Poor     Treatment Plan Summary: Daily contact with patient to assess and evaluate symptoms and progress in treatment and Medication management  Disposition: Recommend psychiatric Inpatient admission when medically cleared. [facility] is under [facility] at [facility]. Updated information faxed on 07-05-2018  Ethelene Hal, NP 07/05/2018 1:48 PM

## 2018-07-05 NOTE — ED Notes (Signed)
Belongings placed in locker number 28, grey shoes noted, with green and white  dress with flowers on it.

## 2018-07-06 ENCOUNTER — Other Ambulatory Visit: Payer: Self-pay

## 2018-07-06 DIAGNOSIS — E785 Hyperlipidemia, unspecified: Secondary | ICD-10-CM | POA: Diagnosis not present

## 2018-07-06 DIAGNOSIS — Z9049 Acquired absence of other specified parts of digestive tract: Secondary | ICD-10-CM | POA: Diagnosis not present

## 2018-07-06 DIAGNOSIS — Z515 Encounter for palliative care: Secondary | ICD-10-CM | POA: Diagnosis not present

## 2018-07-06 DIAGNOSIS — Z79899 Other long term (current) drug therapy: Secondary | ICD-10-CM | POA: Diagnosis not present

## 2018-07-06 DIAGNOSIS — I10 Essential (primary) hypertension: Secondary | ICD-10-CM | POA: Diagnosis not present

## 2018-07-06 DIAGNOSIS — F0281 Dementia in other diseases classified elsewhere with behavioral disturbance: Secondary | ICD-10-CM | POA: Diagnosis not present

## 2018-07-06 DIAGNOSIS — G301 Alzheimer's disease with late onset: Secondary | ICD-10-CM | POA: Diagnosis not present

## 2018-07-06 DIAGNOSIS — Z8673 Personal history of transient ischemic attack (TIA), and cerebral infarction without residual deficits: Secondary | ICD-10-CM | POA: Diagnosis not present

## 2018-07-06 DIAGNOSIS — K5900 Constipation, unspecified: Secondary | ICD-10-CM | POA: Diagnosis not present

## 2018-07-06 DIAGNOSIS — Z888 Allergy status to other drugs, medicaments and biological substances status: Secondary | ICD-10-CM | POA: Diagnosis not present

## 2018-07-06 DIAGNOSIS — Z882 Allergy status to sulfonamides status: Secondary | ICD-10-CM | POA: Diagnosis not present

## 2018-07-06 DIAGNOSIS — R930 Abnormal findings on diagnostic imaging of skull and head, not elsewhere classified: Secondary | ICD-10-CM | POA: Diagnosis not present

## 2018-07-06 DIAGNOSIS — K529 Noninfective gastroenteritis and colitis, unspecified: Secondary | ICD-10-CM | POA: Diagnosis not present

## 2018-07-06 DIAGNOSIS — I129 Hypertensive chronic kidney disease with stage 1 through stage 4 chronic kidney disease, or unspecified chronic kidney disease: Secondary | ICD-10-CM | POA: Diagnosis not present

## 2018-07-06 DIAGNOSIS — R4182 Altered mental status, unspecified: Secondary | ICD-10-CM | POA: Diagnosis not present

## 2018-07-06 DIAGNOSIS — Z7189 Other specified counseling: Secondary | ICD-10-CM | POA: Diagnosis not present

## 2018-07-06 DIAGNOSIS — E559 Vitamin D deficiency, unspecified: Secondary | ICD-10-CM | POA: Diagnosis not present

## 2018-07-06 DIAGNOSIS — Z91040 Latex allergy status: Secondary | ICD-10-CM | POA: Diagnosis not present

## 2018-07-06 DIAGNOSIS — R4689 Other symptoms and signs involving appearance and behavior: Secondary | ICD-10-CM | POA: Diagnosis not present

## 2018-07-06 DIAGNOSIS — Z853 Personal history of malignant neoplasm of breast: Secondary | ICD-10-CM | POA: Diagnosis not present

## 2018-07-06 DIAGNOSIS — K219 Gastro-esophageal reflux disease without esophagitis: Secondary | ICD-10-CM | POA: Diagnosis not present

## 2018-07-06 DIAGNOSIS — Z66 Do not resuscitate: Secondary | ICD-10-CM | POA: Diagnosis not present

## 2018-07-06 DIAGNOSIS — E876 Hypokalemia: Secondary | ICD-10-CM | POA: Diagnosis not present

## 2018-07-06 DIAGNOSIS — F419 Anxiety disorder, unspecified: Secondary | ICD-10-CM | POA: Diagnosis not present

## 2018-07-06 DIAGNOSIS — N183 Chronic kidney disease, stage 3 (moderate): Secondary | ICD-10-CM | POA: Diagnosis not present

## 2018-07-06 DIAGNOSIS — F0391 Unspecified dementia with behavioral disturbance: Secondary | ICD-10-CM | POA: Diagnosis not present

## 2018-07-06 DIAGNOSIS — E7800 Pure hypercholesterolemia, unspecified: Secondary | ICD-10-CM | POA: Diagnosis not present

## 2018-07-06 DIAGNOSIS — R112 Nausea with vomiting, unspecified: Secondary | ICD-10-CM | POA: Diagnosis not present

## 2018-07-06 DIAGNOSIS — D72825 Bandemia: Secondary | ICD-10-CM | POA: Diagnosis not present

## 2018-07-06 NOTE — Progress Notes (Signed)
CSW received a call from [facility] Pt has been accepted by: Shirlee Limerick Number for report is: [phone] Pt's unit/room/bed number will be: 30 Accepting physician: DR. Kerin Salen  Pt can arrive ASAP on 9/30 or after 7am on 10/1  Of note:  Pt is IVC'd.  CSW will update RN and EDP.  Alphonse Guild. Omare Bilotta, LCSW, LCAS, CSI Clinical Social Worker Ph: [phone]

## 2018-07-06 NOTE — ED Notes (Signed)
This nurse in pt room, pt behavior calm and cooperative at this time. Safety sitter present. Will continue to monitor.

## 2018-07-06 NOTE — ED Notes (Signed)
Pt pleasantly confused, smiling with this nurse.

## 2018-07-06 NOTE — BH Assessment (Signed)
[facility] Assessment Progress Note  Per Buford Dresser, DO, this pt requires psychiatric hospitalization at a facility providing specialty care for geriatric patients at this time.  Pt presents under IVC initiated by EDP Marda Stalker, MD.  The following facilities have been contacted to seek placement for this pt, with results as noted:  Beds available, information sent, decision pending:  [facility] Story:  [facility], [place] Coordinator [phone]

## 2018-07-06 NOTE — ED Notes (Signed)
PT is calm without safety mittens on. Walked to bathroom with staff assist and was cooperative and calm.

## 2018-07-06 NOTE — ED Notes (Signed)
Pt ate partial meal by herself and then ate the rest of the meal with staff assisting her. Pt was very calm and friendly during meal.

## 2018-07-06 NOTE — Progress Notes (Signed)
CSW received a call from the pt's son Abuk Selleck and informed him that the pt had D/C'd to [facility].    Pt's son voiced understanding and requested information on guardianship.  CSW suggested that the pt's son request information from APS and the courthouse.    Pt's son continually requested information already asked for and continued clarification on what was already provided by the CSW and had to b e redirected back to the importance of gaining guardianship in order to ensure the pt eventually was to go to Memory Care against her will if this is appropriate.  Pt's son was appreciative and thanked the CSW.  Please reconsult if future social work needs arise.  CSW signing off, as social work intervention is no longer needed.  Alphonse Guild. Shenia Alan, LCSW, LCAS, CSI Clinical Social Worker Ph: [phone]

## 2018-07-06 NOTE — Consult Note (Addendum)
[facility] ED Progress Note  Reason for Consult:  Aggressive behavior Referring Physician:  EDP Patient Identification: HILIANA EILTS MRN:  [id number] Principal Diagnosis: Aggressive behavior of adult Diagnosis:   Patient Active Problem List   Diagnosis Date Noted  . Aggressive behavior of adult [R46.89] 07/04/2018  . Adverse drug reaction [T50.905A] 12/25/2017  . Closed pelvic fracture ([place]) [S32.9XXA] 12/18/2017  . Closed fracture of multiple pubic rami ([place]) [S32.599A] 12/18/2017  . CKD (chronic kidney disease) stage 3, GFR 30-59 ml/min (HCC) [N18.3] 12/18/2017  . Internal hemorrhoids with complication with suspected prolapse [K64.8] 02/16/2014  . Cancer of upper-outer quadrant of female breast ([place]) [C50.419] 06/16/2012  . Hemorrhoids, internal, thrombosed, Left lateral [K64.5] 02/10/2012  . CONSTIPATION [K59.00] 04/18/2008  . HYPERLIPIDEMIA [E78.5] 04/15/2008  . Essential hypertension [I10] 04/15/2008  . TRANSIENT ISCHEMIC ATTACK [G45.9] 04/15/2008  . ESOPHAGEAL STRICTURE [K22.2] 04/15/2008  . GERD [K21.9] 04/15/2008  . ARTHRITIS [M12.9] 04/15/2008    Total Time spent with patient: 30 minutes  Subjective:   EMILINE MANCEBO is a 84 y.o. female patient admitted with aggressive behavior. Upon evaluation patient continues to be oriented to self only. She is stable at this time, and has not exhibited any disruptive, aggressive, or physical harm today. She remains in mitts, and her bed remains padded for safety to herself and bodily harm. She has a Actuary at the bedside. Continue to seek inpatient admission to geropsych.   HPI:  Pt was seen and chart reviewed with treatment team and Dr Dwyane Dee. Pt is a 84 year-old female who presented tot [facility] form home, yesterday for aggressive behavior. Pt and her husband live with their son and his wife. Per chart review: Pt was very agitated and aggressive in the emergency room last night and had to be restrained and have padding put on the rails of the  bed due to kicking and hitting staff. Pt's son reported she is up all night at home and keeps trying to leave the house. Pt would benefit from an inpatient geriatric psychiatric admission for crisis stabilization and medication management. Will order EKG and chest x-ray prior to bed placement.   Past Psychiatric History: Anxiety  Risk to Self: Suicidal Ideation: No Suicidal Intent: No Is patient at risk for suicide?: No, but patient needs Medical Clearance Suicidal Plan?: No Access to Means: No What has been your use of drugs/alcohol within the last 12 months?: Denies How many times?: 0 Other Self Harm Risks: Altered mental state Triggers for Past Attempts: (NA) Intentional Self Injurious Behavior: None Risk to Others: Homicidal Ideation: No Thoughts of Harm to Others: No Current Homicidal Intent: No Current Homicidal Plan: No Access to Homicidal Means: No Identified Victim: NA History of harm to others?: No Assessment of Violence: None Noted Violent Behavior Description: NA Does patient have access to weapons?: No Criminal Charges Pending?: No Does patient have a court date: No Prior Inpatient Therapy: Prior Inpatient Therapy: No Prior Outpatient Therapy: Prior Outpatient Therapy: No Does patient have an ACCT team?: No Does patient have Intensive In-House Services?  : No Does patient have [facility] services? : No Does patient have P4CC services?: No  Past Medical History:  Past Medical History:  Diagnosis Date  . Anxiety   . Arthritis   . Breast cancer ([place])    left  . Cough   . Esophageal stricture   . GERD (gastroesophageal reflux disease)   . Hyperlipidemia   . Hypertension   . Thrombosed external hemorrhoid, R posterior 01/20/2012  .  TIA (transient ischemic attack)     Past Surgical History:  Procedure Laterality Date  . ABDOMINAL HYSTERECTOMY    . BREAST LUMPECTOMY  2010   left  . CHOLECYSTECTOMY  2002  . CYSTECTOMY  in her 47's  . HEEL SPUR SURGERY      right  . MASTECTOMY, PARTIAL     left  . TONSILLECTOMY     Family History:  Family History  Problem Relation Age of Onset  . Lung cancer Brother   . Cervical cancer Sister   . Hypertension Father   . Colon cancer Neg Hx   . Esophageal cancer Neg Hx   . Rectal cancer Neg Hx   . Stomach cancer Neg Hx    Family Psychiatric  History: None per chart review.   Social History:  Social History   Substance and Sexual Activity  Alcohol Use No     Social History   Substance and Sexual Activity  Drug Use No    Social History   Socioeconomic History  . Marital status: Married    Spouse name: Not on file  . Number of children: 1  . Years of education: Not on file  . Highest education level: Not on file  Occupational History  . Occupation: Retired   Scientific laboratory technician  . Financial resource strain: Not on file  . Food insecurity:    Worry: Not on file    Inability: Not on file  . Transportation needs:    Medical: Not on file    Non-medical: Not on file  Tobacco Use  . Smoking status: Never Smoker  . Smokeless tobacco: Never Used  Substance and Sexual Activity  . Alcohol use: No  . Drug use: No  . Sexual activity: Not on file  Lifestyle  . Physical activity:    Days per week: Not on file    Minutes per session: Not on file  . Stress: Not on file  Relationships  . Social connections:    Talks on phone: Not on file    Gets together: Not on file    Attends religious service: Not on file    Active member of club or organization: Not on file    Attends meetings of clubs or organizations: Not on file    Relationship status: Not on file  Other Topics Concern  . Not on file  Social History Narrative   No caffeine    Additional Social History: N/A    Allergies:   Allergies  Allergen Reactions  . Hydrocodone     See 12/24/17 ED note visual hallucinations, combativeness, and recurrent falling on hydrocodone   . Ibandronate Sodium     REACTION: joint aches  . Latex      unsure  . Sulfonamide Derivatives Hives  . Tessalon [Benzonatate]     Fast heart rate, kept pt awake    Labs:  No results found for this or any previous visit (from the past 48 hour(s)).  Current Facility-Administered Medications  Medication Dose Route Frequency Provider Last Rate Last Dose  . acetaminophen (TYLENOL) tablet 650 mg  650 mg Oral Q6H PRN Duffy Bruce, MD      . amitriptyline (ELAVIL) tablet 50 mg  50 mg Oral QHS PRN Duffy Bruce, MD      . atenolol (TENORMIN) tablet 50 mg  50 mg Oral Gloriajean Dell, MD   50 mg at 07/05/18 2058  . ciprofloxacin (CIPRO) tablet 250 mg  250 mg Oral BID Duffy Bruce,  MD   250 mg at 07/06/18 1000  . felodipine (PLENDIL) 24 hr tablet 10 mg  10 mg Oral QHS Duffy Bruce, MD   10 mg at 07/05/18 2058  . furosemide (LASIX) tablet 40 mg  40 mg Oral Daily Duffy Bruce, MD   40 mg at 07/06/18 1000  . galantamine (RAZADYNE ER) 24 hr capsule 16 mg  16 mg Oral Q breakfast Duffy Bruce, MD   16 mg at 07/06/18 1001  . potassium chloride SA (K-DUR,KLOR-CON) CR tablet 20 mEq  20 mEq Oral Daily Duffy Bruce, MD   20 mEq at 07/06/18 1000  . QUEtiapine (SEROQUEL) tablet 25 mg  25 mg Oral QHS Ethelene Hal, NP   25 mg at 07/05/18 2059   Current Outpatient Medications  Medication Sig Dispense Refill  . acetaminophen (TYLENOL) 325 MG tablet Take 650 mg by mouth every 6 (six) hours as needed for mild pain.    Marland Kitchen ALPRAZolam (XANAX) 0.5 MG tablet Take 0.5 mg by mouth 2 (two) times daily as needed for anxiety.     Marland Kitchen amitriptyline (ELAVIL) 50 MG tablet Take 1 tablet (50 mg total) by mouth at bedtime as needed for sleep. 30 tablet 0  . atenolol (TENORMIN) 50 MG tablet Take 1 tablet (50 mg total) by mouth daily. 30 tablet 0  . Cholecalciferol (VITAMIN D3) 2000 UNITS TABS Take 1 tablet by mouth daily.     . ciprofloxacin (CIPRO) 250 MG tablet Take 250 mg by mouth 2 (two) times daily.    . felodipine (PLENDIL) 10 MG 24 hr tablet Take 1 tablet  (10 mg total) by mouth daily. 30 tablet 0  . furosemide (LASIX) 40 MG tablet Take 1 tablet (40 mg total) by mouth daily. 30 tablet 0  . galantamine (RAZADYNE ER) 16 MG 24 hr capsule Take 16 mg by mouth daily with breakfast.     . potassium chloride SA (K-DUR,KLOR-CON) 20 MEQ tablet Take 1 tablet (20 mEq total) by mouth daily. 30 tablet 0  . psyllium (METAMUCIL SMOOTH TEXTURE) 28 % packet Take 1 packet by mouth 2 (two) times daily. 60 packet 3    Musculoskeletal: Strength & Muscle Tone: within normal limits Gait & Station: normal Patient leans: N/A  Psychiatric Specialty Exam: Physical Exam  Nursing note and vitals reviewed. Constitutional: She appears well-developed and well-nourished.  HENT:  Head: Normocephalic and atraumatic.  Neck: Normal range of motion.  Neurological: She is alert.  Oriented to self only.  Psychiatric: Thought content normal. Her mood appears anxious. Her speech is delayed. She is slowed. Cognition and memory are impaired. She expresses impulsivity.    Review of Systems  Cardiovascular:       Hypertensive  Psychiatric/Behavioral: Positive for depression and memory loss.  All other systems reviewed and are negative.   Blood pressure (!) 175/74, pulse 64, temperature 98.1 F (36.7 C), temperature source Oral, resp. rate 18, SpO2 95 %.There is no height or weight on file to calculate BMI.  General Appearance: Casual  Eye Contact:  Good  Speech:  Blocked and Dementia  Volume:  Normal  Mood:  Euthymic, Pt was aggressive and agitated overnight  Affect:  Constricted  Thought Process:  Disorganized  Orientation:  Other:  UTA, dementia  Thought Content:  UTA, dementia  Suicidal Thoughts:  UTA, dementia  Homicidal Thoughts:  UTA, dementia  Memory:  Immediate;   UTA Dementia Recent;   UTA, dementia Remote;   UTA, dementia  Judgement:  Impaired  Insight:  Lacking  Psychomotor Activity:  Decreased  Concentration:  Concentration: UTA, dementia and Attention  Span: UTA, dementia  Recall:  UTA, dementia  Fund of Knowledge:  Poor  Language:  Fair  Akathisia:  No  Handed:  Right  AIMS (if indicated):   N/A  Assets:  Financial Resources/Insurance Housing  ADL's:  Impaired  Cognition:  Impaired,  Severe  Sleep:   N/A     Treatment Plan Summary: Daily contact with patient to assess and evaluate symptoms and progress in treatment and Medication management  Disposition: Recommend psychiatric Inpatient admission when medically cleared.  Nanci Pina, FNP 07/06/2018 12:13 PM   Patient seen face-to-face for psychiatric evaluation, chart reviewed and case discussed with the physician extender and developed treatment plan. Reviewed the information documented and agree with the treatment plan.  Buford Dresser, DO 07/06/18 5:50 PM

## 2018-07-06 NOTE — ED Notes (Signed)
TTS AT BEDSIDE 

## 2018-07-06 NOTE — ED Notes (Signed)
Sheriff called and pt will be transferred around 2030

## 2018-07-06 NOTE — ED Notes (Addendum)
Pt brief changed, urine output only, pt cleaned and turned in bed.  Alert then went back to sleep. Dr Florina Ou aware of pts heart rate while sleeping, no distress noted.

## 2018-07-06 NOTE — Consult Note (Signed)
   [facility] CM Inpatient Consult   07/06/2018  BRUNELLA WILEMAN [birth year] [id number]     [facility] Care Management follow up.   Chart reviewed. Noted Mrs. Buczek remains in the ED. Spoke with ED RNCM to confirm the discharge plan is now for geri-psych. Discussed [facility] Care Management not appropriate at this time.   Please re-refer to [place] Management if disposition plans change. Discussed above notes with [facility] Education officer, museum.    [name], MSN-Ed, RN,BSN [facility] Liaison [phone]

## 2018-07-06 NOTE — ED Notes (Addendum)
PT CONTINUES TO BE CALM AND COOPERATIVE WITH CARE. PT ABLE TO DRINK ORAL FLUIDS WITH AND WITHOUT A STRAW WITHOUT DIFFICULTY. PT IS ABLE TO EAT MEALS WITH ASSISTANCE. PATIENT IS STAFF ASSIST X 2 WITH AMBULATION TO BATHROOM. PATIENT IS ABLE TO URINATE AND HAVE BOWEL MOVEMENTS IN BATHROOM. PT HAS BEEN CONTINENT ALL DAY HOWEVER WAS INCONTINENT LAST NIGHT. PT HAS BEEN PLEASANT WITH STAFF. PT HAS GENERALIZED TREMORS. PT IS ENCOURAGED FREQUENTLY NOT TO PICK AT HER SKIN. PT LISTENS TO THE VERBAL COMMANDS.

## 2018-07-06 NOTE — ED Notes (Signed)
Sheriff called for transport  

## 2018-07-07 ENCOUNTER — Other Ambulatory Visit: Payer: Self-pay | Admitting: *Deleted

## 2018-08-03 DIAGNOSIS — Z8673 Personal history of transient ischemic attack (TIA), and cerebral infarction without residual deficits: Secondary | ICD-10-CM | POA: Diagnosis not present

## 2018-08-03 DIAGNOSIS — E876 Hypokalemia: Secondary | ICD-10-CM | POA: Diagnosis not present

## 2018-08-03 DIAGNOSIS — E785 Hyperlipidemia, unspecified: Secondary | ICD-10-CM | POA: Diagnosis not present

## 2018-08-03 DIAGNOSIS — Z853 Personal history of malignant neoplasm of breast: Secondary | ICD-10-CM | POA: Diagnosis not present

## 2018-08-03 DIAGNOSIS — F0391 Unspecified dementia with behavioral disturbance: Secondary | ICD-10-CM | POA: Diagnosis not present

## 2018-08-03 DIAGNOSIS — K219 Gastro-esophageal reflux disease without esophagitis: Secondary | ICD-10-CM | POA: Diagnosis not present

## 2018-08-03 DIAGNOSIS — E559 Vitamin D deficiency, unspecified: Secondary | ICD-10-CM | POA: Diagnosis not present

## 2018-08-03 DIAGNOSIS — D72825 Bandemia: Secondary | ICD-10-CM | POA: Diagnosis not present

## 2018-08-03 DIAGNOSIS — N183 Chronic kidney disease, stage 3 (moderate): Secondary | ICD-10-CM | POA: Diagnosis not present

## 2018-08-05 DIAGNOSIS — E039 Hypothyroidism, unspecified: Secondary | ICD-10-CM | POA: Diagnosis not present

## 2018-08-05 DIAGNOSIS — E559 Vitamin D deficiency, unspecified: Secondary | ICD-10-CM | POA: Diagnosis not present

## 2018-08-05 DIAGNOSIS — Z79899 Other long term (current) drug therapy: Secondary | ICD-10-CM | POA: Diagnosis not present

## 2018-08-17 DIAGNOSIS — Z853 Personal history of malignant neoplasm of breast: Secondary | ICD-10-CM | POA: Diagnosis not present

## 2018-08-17 DIAGNOSIS — Z8673 Personal history of transient ischemic attack (TIA), and cerebral infarction without residual deficits: Secondary | ICD-10-CM | POA: Diagnosis not present

## 2018-08-17 DIAGNOSIS — L03115 Cellulitis of right lower limb: Secondary | ICD-10-CM | POA: Diagnosis not present

## 2018-08-17 DIAGNOSIS — D72825 Bandemia: Secondary | ICD-10-CM | POA: Diagnosis not present

## 2018-08-17 DIAGNOSIS — F0391 Unspecified dementia with behavioral disturbance: Secondary | ICD-10-CM | POA: Diagnosis not present

## 2018-08-20 DIAGNOSIS — L03115 Cellulitis of right lower limb: Secondary | ICD-10-CM | POA: Diagnosis not present

## 2018-08-20 DIAGNOSIS — Z9181 History of falling: Secondary | ICD-10-CM | POA: Diagnosis not present

## 2018-08-20 DIAGNOSIS — F0281 Dementia in other diseases classified elsewhere with behavioral disturbance: Secondary | ICD-10-CM | POA: Diagnosis not present

## 2018-08-20 DIAGNOSIS — S81801D Unspecified open wound, right lower leg, subsequent encounter: Secondary | ICD-10-CM | POA: Diagnosis not present

## 2018-08-20 DIAGNOSIS — N183 Chronic kidney disease, stage 3 (moderate): Secondary | ICD-10-CM | POA: Diagnosis not present

## 2018-08-20 DIAGNOSIS — Z8673 Personal history of transient ischemic attack (TIA), and cerebral infarction without residual deficits: Secondary | ICD-10-CM | POA: Diagnosis not present

## 2018-08-20 DIAGNOSIS — E559 Vitamin D deficiency, unspecified: Secondary | ICD-10-CM | POA: Diagnosis not present

## 2018-08-20 DIAGNOSIS — G309 Alzheimer's disease, unspecified: Secondary | ICD-10-CM | POA: Diagnosis not present

## 2018-08-20 DIAGNOSIS — Z853 Personal history of malignant neoplasm of breast: Secondary | ICD-10-CM | POA: Diagnosis not present

## 2018-08-20 DIAGNOSIS — J449 Chronic obstructive pulmonary disease, unspecified: Secondary | ICD-10-CM | POA: Diagnosis not present

## 2018-08-23 ENCOUNTER — Emergency Department (HOSPITAL_COMMUNITY): Payer: PPO

## 2018-08-23 ENCOUNTER — Inpatient Hospital Stay (HOSPITAL_COMMUNITY)
Admission: EM | Admit: 2018-08-23 | Discharge: 2018-08-31 | DRG: 871 | Disposition: A | Discharge status: Home / Self Care | Payer: PPO | Attending: Internal Medicine | Admitting: Internal Medicine

## 2018-08-23 ENCOUNTER — Other Ambulatory Visit: Payer: Self-pay

## 2018-08-23 ENCOUNTER — Encounter (HOSPITAL_COMMUNITY): Payer: Self-pay | Admitting: Emergency Medicine

## 2018-08-23 DIAGNOSIS — G20 Parkinson's disease: Secondary | ICD-10-CM | POA: Diagnosis not present

## 2018-08-23 DIAGNOSIS — R011 Cardiac murmur, unspecified: Secondary | ICD-10-CM | POA: Diagnosis not present

## 2018-08-23 DIAGNOSIS — D849 Immunodeficiency, unspecified: Secondary | ICD-10-CM | POA: Diagnosis not present

## 2018-08-23 DIAGNOSIS — Z885 Allergy status to narcotic agent status: Secondary | ICD-10-CM

## 2018-08-23 DIAGNOSIS — Z9049 Acquired absence of other specified parts of digestive tract: Secondary | ICD-10-CM | POA: Diagnosis not present

## 2018-08-23 DIAGNOSIS — I161 Hypertensive emergency: Secondary | ICD-10-CM | POA: Diagnosis not present

## 2018-08-23 DIAGNOSIS — E861 Hypovolemia: Secondary | ICD-10-CM | POA: Diagnosis not present

## 2018-08-23 DIAGNOSIS — L03115 Cellulitis of right lower limb: Secondary | ICD-10-CM | POA: Diagnosis not present

## 2018-08-23 DIAGNOSIS — Z853 Personal history of malignant neoplasm of breast: Secondary | ICD-10-CM

## 2018-08-23 DIAGNOSIS — I129 Hypertensive chronic kidney disease with stage 1 through stage 4 chronic kidney disease, or unspecified chronic kidney disease: Secondary | ICD-10-CM | POA: Diagnosis not present

## 2018-08-23 DIAGNOSIS — Z760 Encounter for issue of repeat prescription: Secondary | ICD-10-CM | POA: Diagnosis not present

## 2018-08-23 DIAGNOSIS — Z66 Do not resuscitate: Secondary | ICD-10-CM | POA: Diagnosis not present

## 2018-08-23 DIAGNOSIS — F419 Anxiety disorder, unspecified: Secondary | ICD-10-CM | POA: Diagnosis not present

## 2018-08-23 DIAGNOSIS — F0281 Dementia in other diseases classified elsewhere with behavioral disturbance: Secondary | ICD-10-CM | POA: Diagnosis not present

## 2018-08-23 DIAGNOSIS — G92 Toxic encephalopathy: Secondary | ICD-10-CM | POA: Diagnosis present

## 2018-08-23 DIAGNOSIS — R6520 Severe sepsis without septic shock: Secondary | ICD-10-CM | POA: Diagnosis not present

## 2018-08-23 DIAGNOSIS — Z90710 Acquired absence of both cervix and uterus: Secondary | ICD-10-CM | POA: Diagnosis not present

## 2018-08-23 DIAGNOSIS — E785 Hyperlipidemia, unspecified: Secondary | ICD-10-CM | POA: Diagnosis not present

## 2018-08-23 DIAGNOSIS — Z9012 Acquired absence of left breast and nipple: Secondary | ICD-10-CM

## 2018-08-23 DIAGNOSIS — R739 Hyperglycemia, unspecified: Secondary | ICD-10-CM | POA: Diagnosis not present

## 2018-08-23 DIAGNOSIS — Z91040 Latex allergy status: Secondary | ICD-10-CM

## 2018-08-23 DIAGNOSIS — D631 Anemia in chronic kidney disease: Secondary | ICD-10-CM | POA: Diagnosis present

## 2018-08-23 DIAGNOSIS — G9341 Metabolic encephalopathy: Secondary | ICD-10-CM | POA: Diagnosis not present

## 2018-08-23 DIAGNOSIS — M25579 Pain in unspecified ankle and joints of unspecified foot: Secondary | ICD-10-CM

## 2018-08-23 DIAGNOSIS — R509 Fever, unspecified: Secondary | ICD-10-CM

## 2018-08-23 DIAGNOSIS — A419 Sepsis, unspecified organism: Principal | ICD-10-CM | POA: Diagnosis present

## 2018-08-23 DIAGNOSIS — I10 Essential (primary) hypertension: Secondary | ICD-10-CM | POA: Diagnosis not present

## 2018-08-23 DIAGNOSIS — N183 Chronic kidney disease, stage 3 (moderate): Secondary | ICD-10-CM | POA: Diagnosis present

## 2018-08-23 DIAGNOSIS — I959 Hypotension, unspecified: Secondary | ICD-10-CM | POA: Diagnosis present

## 2018-08-23 DIAGNOSIS — Z8249 Family history of ischemic heart disease and other diseases of the circulatory system: Secondary | ICD-10-CM

## 2018-08-23 DIAGNOSIS — I159 Secondary hypertension, unspecified: Secondary | ICD-10-CM | POA: Diagnosis not present

## 2018-08-23 DIAGNOSIS — E876 Hypokalemia: Secondary | ICD-10-CM | POA: Diagnosis not present

## 2018-08-23 DIAGNOSIS — R0902 Hypoxemia: Secondary | ICD-10-CM | POA: Diagnosis not present

## 2018-08-23 DIAGNOSIS — E872 Acidosis, unspecified: Secondary | ICD-10-CM | POA: Diagnosis present

## 2018-08-23 DIAGNOSIS — R4689 Other symptoms and signs involving appearance and behavior: Secondary | ICD-10-CM | POA: Diagnosis present

## 2018-08-23 DIAGNOSIS — I2721 Secondary pulmonary arterial hypertension: Secondary | ICD-10-CM | POA: Diagnosis present

## 2018-08-23 DIAGNOSIS — K219 Gastro-esophageal reflux disease without esophagitis: Secondary | ICD-10-CM | POA: Diagnosis not present

## 2018-08-23 DIAGNOSIS — M25571 Pain in right ankle and joints of right foot: Secondary | ICD-10-CM | POA: Diagnosis not present

## 2018-08-23 DIAGNOSIS — Z79899 Other long term (current) drug therapy: Secondary | ICD-10-CM

## 2018-08-23 DIAGNOSIS — R000 Tachycardia, unspecified: Secondary | ICD-10-CM | POA: Diagnosis not present

## 2018-08-23 DIAGNOSIS — F0391 Unspecified dementia with behavioral disturbance: Secondary | ICD-10-CM | POA: Diagnosis present

## 2018-08-23 DIAGNOSIS — R404 Transient alteration of awareness: Secondary | ICD-10-CM | POA: Diagnosis not present

## 2018-08-23 DIAGNOSIS — I9589 Other hypotension: Secondary | ICD-10-CM | POA: Diagnosis not present

## 2018-08-23 DIAGNOSIS — Z8673 Personal history of transient ischemic attack (TIA), and cerebral infarction without residual deficits: Secondary | ICD-10-CM

## 2018-08-23 DIAGNOSIS — Z888 Allergy status to other drugs, medicaments and biological substances status: Secondary | ICD-10-CM

## 2018-08-23 DIAGNOSIS — Z882 Allergy status to sulfonamides status: Secondary | ICD-10-CM

## 2018-08-23 LAB — COMPREHENSIVE METABOLIC PANEL
ALT: 13 U/L (ref 0–44)
ANION GAP: 10 (ref 5–15)
AST: 21 U/L (ref 15–41)
Albumin: 2.9 g/dL — ABNORMAL LOW (ref 3.5–5.0)
Alkaline Phosphatase: 40 U/L (ref 38–126)
BILIRUBIN TOTAL: 0.5 mg/dL (ref 0.3–1.2)
BUN: 20 mg/dL (ref 8–23)
CHLORIDE: 105 mmol/L (ref 98–111)
CO2: 21 mmol/L — ABNORMAL LOW (ref 22–32)
Calcium: 8.5 mg/dL — ABNORMAL LOW (ref 8.9–10.3)
Creatinine, Ser: 0.89 mg/dL (ref 0.44–1.00)
GFR, EST NON AFRICAN AMERICAN: 55 mL/min — AB (ref >60)
Glucose, Bld: 131 mg/dL — ABNORMAL HIGH (ref 70–99)
Potassium: 2.7 mmol/L — CL (ref 3.5–5.1)
Sodium: 136 mmol/L (ref 135–145)
TOTAL PROTEIN: 6 g/dL — AB (ref 6.5–8.1)

## 2018-08-23 LAB — I-STAT CG4 LACTIC ACID, ED: LACTIC ACID, VENOUS: 2.95 mmol/L — AB (ref 0.5–1.9)

## 2018-08-23 LAB — CBC WITH DIFFERENTIAL/PLATELET
Abs Immature Granulocytes: 0.08 10*3/uL — ABNORMAL HIGH (ref 0.00–0.07)
BASOS ABS: 0 10*3/uL (ref 0.0–0.1)
BASOS PCT: 0 %
Eosinophils Absolute: 0 10*3/uL (ref 0.0–0.5)
Eosinophils Relative: 0 %
HCT: 33.5 % — ABNORMAL LOW (ref 36.0–46.0)
HEMOGLOBIN: 11.1 g/dL — AB (ref 12.0–15.0)
IMMATURE GRANULOCYTES: 1 %
Lymphocytes Relative: 9 %
Lymphs Abs: 1.3 10*3/uL (ref 0.7–4.0)
MCH: 29.4 pg (ref 26.0–34.0)
MCHC: 33.1 g/dL (ref 30.0–36.0)
MCV: 88.6 fL (ref 80.0–100.0)
MONO ABS: 1.7 10*3/uL — AB (ref 0.1–1.0)
Monocytes Relative: 12 %
NEUTROS PCT: 78 %
NRBC: 0 % (ref 0.0–0.2)
Neutro Abs: 11.3 10*3/uL — ABNORMAL HIGH (ref 1.7–7.7)
PLATELETS: 187 10*3/uL (ref 150–400)
RBC: 3.78 MIL/uL — AB (ref 3.87–5.11)
RDW: 13.7 % (ref 11.5–15.5)
WBC: 14.4 10*3/uL — ABNORMAL HIGH (ref 4.0–10.5)

## 2018-08-23 LAB — URINALYSIS, ROUTINE W REFLEX MICROSCOPIC
Bilirubin Urine: NEGATIVE
Glucose, UA: NEGATIVE mg/dL
Hgb urine dipstick: NEGATIVE
Ketones, ur: NEGATIVE mg/dL
Leukocytes, UA: NEGATIVE
NITRITE: NEGATIVE
PROTEIN: NEGATIVE mg/dL
Specific Gravity, Urine: 1.008 (ref 1.005–1.030)
pH: 7 (ref 5.0–8.0)

## 2018-08-23 MED ORDER — POTASSIUM CHLORIDE 10 MEQ/100ML IV SOLN
10.0000 meq | INTRAVENOUS | Status: AC
  Administered 2018-08-24 (×4): 10 meq via INTRAVENOUS
  Filled 2018-08-23 (×4): qty 100

## 2018-08-23 MED ORDER — SODIUM CHLORIDE 0.9 % IV SOLN
500.0000 mg | Freq: Once | INTRAVENOUS | Status: AC
  Administered 2018-08-23: 500 mg via INTRAVENOUS
  Filled 2018-08-23: qty 500

## 2018-08-23 MED ORDER — LACTATED RINGERS IV BOLUS
1000.0000 mL | Freq: Once | INTRAVENOUS | Status: AC
  Administered 2018-08-23: 1000 mL via INTRAVENOUS

## 2018-08-23 MED ORDER — SODIUM CHLORIDE 0.9 % IV SOLN
1.0000 g | Freq: Once | INTRAVENOUS | Status: AC
  Administered 2018-08-23: 1 g via INTRAVENOUS
  Filled 2018-08-23: qty 10

## 2018-08-23 NOTE — ED Notes (Signed)
Patient transported to X-ray 

## 2018-08-23 NOTE — ED Notes (Signed)
Bed: UG89 Expected date:  Expected time:  Means of arrival:  Comments: 84 yo F/Fever poss sepsis

## 2018-08-23 NOTE — ED Notes (Addendum)
Date and time results received: 08/23/18 2328      Test: Potassium  Critical Value: 2.7 Name of Provider Notified: Dr. Loletha Grayer

## 2018-08-23 NOTE — ED Provider Notes (Signed)
[facility] DEPT Provider Note   CSN: [id number] Arrival date & time: 08/23/18  2122     History   Chief Complaint Chief Complaint  Patient presents with  . Fever    HPI [name] is a 84 y.o. female.  The history is provided by the patient.  Fever   This is a new problem. The current episode started 6 to 12 hours ago. The problem occurs constantly. The problem has not changed since onset.The maximum temperature noted was 103 to 104 F. The temperature was taken using an oral thermometer. Associated symptoms include muscle aches and cough. Pertinent negatives include no chest pain, no fussiness, no sleepiness, no diarrhea, no vomiting, no congestion, no headaches, no sore throat and no tugging at ear. She has tried acetaminophen (got 1000 mg of tylenol with EMS prior to arrival and 250cc of fluid.) for the symptoms. The treatment provided mild relief.    Past Medical History:  Diagnosis Date  . Anxiety   . Arthritis   . Breast cancer ([place])    left  . Cough   . Esophageal stricture   . GERD (gastroesophageal reflux disease)   . Hyperlipidemia   . Hypertension   . Thrombosed external hemorrhoid, R posterior 01/20/2012  . TIA (transient ischemic attack)     Patient Active Problem List   Diagnosis Date Noted  . Aggressive behavior of adult 07/04/2018  . Adverse drug reaction 12/25/2017  . Closed pelvic fracture ([place]) 12/18/2017  . Closed fracture of multiple pubic rami ([place]) 12/18/2017  . CKD (chronic kidney disease) stage 3, GFR 30-59 ml/min (HCC) 12/18/2017  . Internal hemorrhoids with complication with suspected prolapse 02/16/2014  . Cancer of upper-outer quadrant of female breast ([place]) 06/16/2012  . Hemorrhoids, internal, thrombosed, Left lateral 02/10/2012  . CONSTIPATION 04/18/2008  . HYPERLIPIDEMIA 04/15/2008  . Essential hypertension 04/15/2008  . TRANSIENT ISCHEMIC ATTACK 04/15/2008  . ESOPHAGEAL STRICTURE 04/15/2008  .  GERD 04/15/2008  . ARTHRITIS 04/15/2008    Past Surgical History:  Procedure Laterality Date  . ABDOMINAL HYSTERECTOMY    . BREAST LUMPECTOMY  2010   left  . CHOLECYSTECTOMY  2002  . CYSTECTOMY  in her 38's  . HEEL SPUR SURGERY     right  . MASTECTOMY, PARTIAL     left  . TONSILLECTOMY       OB History   None      Home Medications    Prior to Admission medications   Medication Sig Start Date End Date Taking? Authorizing Provider  acetaminophen (TYLENOL) 325 MG tablet Take 650 mg by mouth every 6 (six) hours as needed for mild pain.   Yes [provider]  amLODipine (NORVASC) 10 MG tablet Take 10 mg by mouth daily.   Yes [provider]  Cholecalciferol (VITAMIN D3) 2000 UNITS TABS Take 1,000 Units by mouth daily.    Yes [provider]  divalproex (DEPAKOTE SPRINKLE) 125 MG capsule Take 250 mg by mouth 2 (two) times daily.   Yes [provider]  doxycycline (VIBRA-TABS) 100 MG tablet Take 100 mg by mouth 2 (two) times daily.   Yes [provider]  furosemide (LASIX) 40 MG tablet Take 1 tablet (40 mg total) by mouth daily. Patient taking differently: Take 20 mg by mouth every Monday, Wednesday, and Friday.  01/09/18  Yes Medina-Vargas, Monina C, NP  LORazepam (ATIVAN) 0.5 MG tablet Take 0.5 mg by mouth every 6 (six) hours as needed for anxiety.  Yes [provider]  metoprolol tartrate (LOPRESSOR) 50 MG tablet Take 50 mg by mouth 2 (two) times daily.   Yes [provider]  OLANZapine (ZYPREXA) 5 MG tablet Take 5 mg by mouth at bedtime.   Yes [provider]  potassium chloride SA (K-DUR,KLOR-CON) 20 MEQ tablet Take 1 tablet (20 mEq total) by mouth daily. 01/09/18  Yes Medina-Vargas, Monina C, NP  psyllium (METAMUCIL SMOOTH TEXTURE) 28 % packet Take 1 packet by mouth 2 (two) times daily. 02/16/14  Yes Stark Klein, MD  traZODone (DESYREL) 50 MG tablet Take 50 mg by mouth at bedtime.   Yes [provider]  amitriptyline (ELAVIL) 50 MG tablet Take 1 tablet (50 mg total) by mouth at bedtime as needed for sleep. Patient not taking: Reported on 08/23/2018 01/09/18   Medina-Vargas, Monina C, NP  atenolol (TENORMIN) 50 MG tablet Take 1 tablet (50 mg total) by mouth daily. Patient not taking: Reported on 08/23/2018 01/09/18   Medina-Vargas, Monina C, NP  felodipine (PLENDIL) 10 MG 24 hr tablet Take 1 tablet (10 mg total) by mouth daily. Patient not taking: Reported on 08/23/2018 01/09/18   Medina-Vargas, Senaida Lange, NP    Family History Family History  Problem Relation Age of Onset  . Lung cancer Brother   . Cervical cancer Sister   . Hypertension Father   . Colon cancer Neg Hx   . Esophageal cancer Neg Hx   . Rectal cancer Neg Hx   . Stomach cancer Neg Hx     Social History Social History   Tobacco Use  . Smoking status: Never Smoker  . Smokeless tobacco: Never Used  Substance Use Topics  . Alcohol use: No  . Drug use: No     Allergies   Hydrocodone; Ibandronate sodium; Latex; Sulfonamide derivatives; and Tessalon [benzonatate]   Review of Systems Review of Systems  Constitutional: Positive for fever. Negative for chills.  HENT: Negative for congestion, ear pain and sore throat.   Eyes: Negative for pain and visual disturbance.  Respiratory: Positive for cough. Negative for shortness of breath.   Cardiovascular: Negative for chest pain and palpitations.  Gastrointestinal: Negative for abdominal pain, diarrhea and vomiting.  Genitourinary: Negative for dysuria and hematuria.  Musculoskeletal: Negative for arthralgias and back pain.  Skin: Negative for color change and rash.  Neurological: Negative for seizures, syncope and headaches.  All other systems reviewed and are negative.    Physical Exam Updated Vital Signs  ED Triage Vitals  Enc Vitals Group     BP 08/23/18 2136 (!) 150/70     Pulse Rate 08/23/18 2136 90     Resp 08/23/18 2136 (!) 22     Temp 08/23/18 2136 (!)  101.3 F (38.5 C)     Temp Source 08/23/18 2136 Rectal     SpO2 08/23/18 2126 96 %     Weight 08/23/18 2139 155 lb (70.3 kg)     Height 08/23/18 2139 5\' 6"  (1.676 m)     Head Circumference --      Peak Flow --      Pain Score 08/23/18 2139 0     Pain Loc --      Pain Edu? --      Excl. in [place]? --     Physical Exam  Constitutional: She appears well-developed and well-nourished. No distress.  HENT:  Head: Normocephalic and atraumatic.  Mouth/Throat: No oropharyngeal exudate.  Eyes: Pupils are equal, round, and reactive to light. Conjunctivae and  EOM are normal.  Neck: Normal range of motion. Neck supple.  Cardiovascular: Normal rate, regular rhythm, normal heart sounds and intact distal pulses.  No murmur heard. Pulmonary/Chest: Effort normal and breath sounds normal. No respiratory distress. She has no wheezes.  Abdominal: Soft. There is no tenderness.  Musculoskeletal: She exhibits no edema.  Neurological: She is alert.  Skin: Skin is warm and dry. Capillary refill takes less than 2 seconds. There is erythema (mild redness to right lower leg around abrasion site, no crepitus).  Psychiatric: She has a normal mood and affect.  Nursing note and vitals reviewed.    ED Treatments / Results  Labs (all labs ordered are listed, but only abnormal results are displayed) Labs Reviewed  COMPREHENSIVE METABOLIC PANEL - Abnormal; Notable for the following components:      Result Value   Potassium 2.7 (*)    CO2 21 (*)    Glucose, Bld 131 (*)    Calcium 8.5 (*)    Total Protein 6.0 (*)    Albumin 2.9 (*)    GFR calc non Af Amer 55 (*)    All other components within normal limits  CBC WITH DIFFERENTIAL/PLATELET - Abnormal; Notable for the following components:   WBC 14.4 (*)    RBC 3.78 (*)    Hemoglobin 11.1 (*)    HCT 33.5 (*)    Neutro Abs 11.3 (*)    Monocytes Absolute 1.7 (*)    Abs Immature Granulocytes 0.08 (*)    All other components within normal limits  URINALYSIS,  ROUTINE W REFLEX MICROSCOPIC - Abnormal; Notable for the following components:   Color, Urine STRAW (*)    All other components within normal limits  I-STAT CG4 LACTIC ACID, ED - Abnormal; Notable for the following components:   Lactic Acid, Venous 2.95 (*)    All other components within normal limits  CULTURE, BLOOD (ROUTINE X 2)  CULTURE, BLOOD (ROUTINE X 2)  URINE CULTURE  INFLUENZA PANEL BY PCR (TYPE A & B)  I-STAT CG4 LACTIC ACID, ED    EKG None  Radiology Dg Chest 2 View  Result Date: 08/23/2018 CLINICAL DATA:  Fever EXAM: CHEST - 2 VIEW COMPARISON:  07/04/2018.  Chest CT 10/17/2015 FINDINGS: Cardiomegaly with vascular congestion. Prominent hila bilaterally. When comparing to prior CT, this likely is related to pulmonary arterial hypertension as seen on prior CT. No confluent opacities or effusions. No acute bony abnormality. IMPRESSION: Cardiomegaly. Bilateral hilar fullness likely related to pulmonary arterial hypertension seen on prior chest CT. Low volumes.  No active disease. Electronically Signed   By: Rolm Baptise M.D.   On: 08/23/2018 22:14    Procedures .Critical Care Performed by: Lennice Sites, DO Authorized by: Lennice Sites, DO   Critical care provider statement:    Critical care time (minutes):  40   Critical care time was exclusive of:  Separately billable procedures and treating other patients and teaching time   Critical care was necessary to treat or prevent imminent or life-threatening deterioration of the following conditions:  Sepsis   Critical care was time spent personally by me on the following activities:  Development of treatment plan with patient or surrogate, discussions with primary provider, evaluation of patient's response to treatment, ordering and performing treatments and interventions, ordering and review of laboratory studies, ordering and review of radiographic studies, pulse oximetry and re-evaluation of patient's condition   I assumed  direction of critical care for this patient from another provider in my specialty:  no     (including critical care time)  Medications Ordered in ED Medications  azithromycin (ZITHROMAX) 500 mg in sodium chloride 0.9 % 250 mL IVPB (500 mg Intravenous New Bag/Given 08/23/18 2337)  potassium chloride 10 mEq in 100 mL IVPB (has no administration in time range)  lactated ringers bolus 1,000 mL (1,000 mLs Intravenous New Bag/Given 08/23/18 2223)  cefTRIAXone (ROCEPHIN) 1 g in sodium chloride 0.9 % 100 mL IVPB (0 g Intravenous Stopped 08/23/18 2303)     Initial Impression / Assessment and Plan / ED Course  I have reviewed the triage vital signs and the nursing notes.  Pertinent labs & imaging results that were available during my care of the patient were reviewed by me and considered in my medical decision making (see chart for details).     AVANNI TURNBAUGH is a 84 year old female with history of dementia, breast cancer, hypertension, high cholesterol who presents to the ED with fever.  Patient with fever, tachypnea upon arrival.  Otherwise normal blood pressure, no tachycardia.  Patient was tachycardic in the 130s with EMS but seems to have improved after 1 g of Tylenol and some fluids given IV.  Patient feels warm to the touch.  She is overall alert.  States that she has had a cough may be some sputum production.  Denies any abdominal pain.  No pain with urination.  According to EMS patient has been on doxycycline for right lower leg cellulitis.  Has some redness to the anterior shin around an abrasion but no obvious cellulitis on exam.  Patient has overall clear breath sounds.  Given fever, tachycardia, tachypnea concern for sepsis.  Patient had blood cultures, urine studies, chest x-ray, lactic acid, empiric IV antibiotics with Rocephin and Zithromax given.  Suspect patient likely with pneumonia versus viral process versus possible UTI.  Patient given IV lactated Ringer bolus.  Patient with  leukocytosis, elevated lactic acid but no signs of obvious pneumonia on chest x-ray no urinary tract infection.  Patient with no significant anemia. Patient with low potassium and repleted IV. Flu testing in process. Suspect patient likely with viral process vs occult pneumonia. Patient has been on doxy for the last several days for a cellulitis that its overall well appearing but potential to cause sepsis as well. Patient admitted to hospitalist in stable condition. Patient is DNR.   This chart was dictated using voice recognition software.  Despite best efforts to proofread,  errors can occur which can change the documentation meaning.   Final Clinical Impressions(s) / ED Diagnoses   Final diagnoses:  Sepsis, due to unspecified organism, unspecified whether acute organ dysfunction present [facility])  Fever, unspecified fever cause    ED Discharge Orders    None       Lennice Sites, DO 08/23/18 2347

## 2018-08-23 NOTE — ED Triage Notes (Signed)
Pt presents from [facility] via [facility] for fever. Pt was given 1000mg  Tylenol in transport due to  Temp of 102. EMS reported that staff was not sure how long she has had a fever.

## 2018-08-24 DIAGNOSIS — Z853 Personal history of malignant neoplasm of breast: Secondary | ICD-10-CM | POA: Diagnosis not present

## 2018-08-24 DIAGNOSIS — F0391 Unspecified dementia with behavioral disturbance: Secondary | ICD-10-CM | POA: Diagnosis present

## 2018-08-24 DIAGNOSIS — Z9049 Acquired absence of other specified parts of digestive tract: Secondary | ICD-10-CM | POA: Diagnosis not present

## 2018-08-24 DIAGNOSIS — R739 Hyperglycemia, unspecified: Secondary | ICD-10-CM | POA: Diagnosis not present

## 2018-08-24 DIAGNOSIS — E785 Hyperlipidemia, unspecified: Secondary | ICD-10-CM | POA: Diagnosis present

## 2018-08-24 DIAGNOSIS — K219 Gastro-esophageal reflux disease without esophagitis: Secondary | ICD-10-CM | POA: Diagnosis present

## 2018-08-24 DIAGNOSIS — R6520 Severe sepsis without septic shock: Secondary | ICD-10-CM | POA: Diagnosis present

## 2018-08-24 DIAGNOSIS — Z9012 Acquired absence of left breast and nipple: Secondary | ICD-10-CM | POA: Diagnosis not present

## 2018-08-24 DIAGNOSIS — G9341 Metabolic encephalopathy: Secondary | ICD-10-CM | POA: Diagnosis not present

## 2018-08-24 DIAGNOSIS — E872 Acidosis, unspecified: Secondary | ICD-10-CM | POA: Diagnosis present

## 2018-08-24 DIAGNOSIS — I161 Hypertensive emergency: Secondary | ICD-10-CM | POA: Diagnosis not present

## 2018-08-24 DIAGNOSIS — A419 Sepsis, unspecified organism: Principal | ICD-10-CM

## 2018-08-24 DIAGNOSIS — M25571 Pain in right ankle and joints of right foot: Secondary | ICD-10-CM | POA: Diagnosis not present

## 2018-08-24 DIAGNOSIS — L03115 Cellulitis of right lower limb: Secondary | ICD-10-CM | POA: Diagnosis present

## 2018-08-24 DIAGNOSIS — Z90710 Acquired absence of both cervix and uterus: Secondary | ICD-10-CM | POA: Diagnosis not present

## 2018-08-24 DIAGNOSIS — I159 Secondary hypertension, unspecified: Secondary | ICD-10-CM | POA: Diagnosis not present

## 2018-08-24 DIAGNOSIS — I9589 Other hypotension: Secondary | ICD-10-CM | POA: Diagnosis not present

## 2018-08-24 DIAGNOSIS — E876 Hypokalemia: Secondary | ICD-10-CM | POA: Diagnosis not present

## 2018-08-24 DIAGNOSIS — R509 Fever, unspecified: Secondary | ICD-10-CM | POA: Diagnosis present

## 2018-08-24 DIAGNOSIS — I2721 Secondary pulmonary arterial hypertension: Secondary | ICD-10-CM | POA: Diagnosis present

## 2018-08-24 DIAGNOSIS — I959 Hypotension, unspecified: Secondary | ICD-10-CM | POA: Diagnosis present

## 2018-08-24 DIAGNOSIS — R011 Cardiac murmur, unspecified: Secondary | ICD-10-CM | POA: Diagnosis present

## 2018-08-24 DIAGNOSIS — N183 Chronic kidney disease, stage 3 (moderate): Secondary | ICD-10-CM | POA: Diagnosis present

## 2018-08-24 DIAGNOSIS — F419 Anxiety disorder, unspecified: Secondary | ICD-10-CM | POA: Diagnosis present

## 2018-08-24 DIAGNOSIS — G92 Toxic encephalopathy: Secondary | ICD-10-CM | POA: Diagnosis present

## 2018-08-24 DIAGNOSIS — D631 Anemia in chronic kidney disease: Secondary | ICD-10-CM | POA: Diagnosis present

## 2018-08-24 DIAGNOSIS — I129 Hypertensive chronic kidney disease with stage 1 through stage 4 chronic kidney disease, or unspecified chronic kidney disease: Secondary | ICD-10-CM | POA: Diagnosis present

## 2018-08-24 DIAGNOSIS — Z8673 Personal history of transient ischemic attack (TIA), and cerebral infarction without residual deficits: Secondary | ICD-10-CM | POA: Diagnosis not present

## 2018-08-24 DIAGNOSIS — Z66 Do not resuscitate: Secondary | ICD-10-CM | POA: Diagnosis present

## 2018-08-24 LAB — CBC
HEMATOCRIT: 31.6 % — AB (ref 36.0–46.0)
Hemoglobin: 10.4 g/dL — ABNORMAL LOW (ref 12.0–15.0)
MCH: 29.5 pg (ref 26.0–34.0)
MCHC: 32.9 g/dL (ref 30.0–36.0)
MCV: 89.8 fL (ref 80.0–100.0)
PLATELETS: 184 10*3/uL (ref 150–400)
RBC: 3.52 MIL/uL — ABNORMAL LOW (ref 3.87–5.11)
RDW: 13.8 % (ref 11.5–15.5)
WBC: 14.4 10*3/uL — ABNORMAL HIGH (ref 4.0–10.5)
nRBC: 0 % (ref 0.0–0.2)

## 2018-08-24 LAB — CREATININE, SERUM
Creatinine, Ser: 0.89 mg/dL (ref 0.44–1.00)
GFR calc non Af Amer: 55 mL/min — ABNORMAL LOW (ref >60)

## 2018-08-24 LAB — INFLUENZA PANEL BY PCR (TYPE A & B)
INFLAPCR: NEGATIVE
INFLBPCR: NEGATIVE

## 2018-08-24 LAB — BASIC METABOLIC PANEL
Anion gap: 12 (ref 5–15)
BUN: 12 mg/dL (ref 8–23)
CALCIUM: 7.8 mg/dL — AB (ref 8.9–10.3)
CO2: 17 mmol/L — ABNORMAL LOW (ref 22–32)
CREATININE: 0.68 mg/dL (ref 0.44–1.00)
Chloride: 110 mmol/L (ref 98–111)
GFR calc Af Amer: >60 mL/min (ref >60)
Glucose, Bld: 111 mg/dL — ABNORMAL HIGH (ref 70–99)
Potassium: 2.9 mmol/L — ABNORMAL LOW (ref 3.5–5.1)
SODIUM: 139 mmol/L (ref 135–145)

## 2018-08-24 LAB — LACTIC ACID, PLASMA
LACTIC ACID, VENOUS: 2.9 mmol/L — AB (ref 0.5–1.9)
Lactic Acid, Venous: 1.4 mmol/L (ref 0.5–1.9)

## 2018-08-24 LAB — CBC WITH DIFFERENTIAL/PLATELET
ABS IMMATURE GRANULOCYTES: 0.1 10*3/uL — AB (ref 0.00–0.07)
BASOS PCT: 0 %
Basophils Absolute: 0 10*3/uL (ref 0.0–0.1)
EOS ABS: 0 10*3/uL (ref 0.0–0.5)
EOS PCT: 0 %
HCT: 33.8 % — ABNORMAL LOW (ref 36.0–46.0)
Hemoglobin: 11 g/dL — ABNORMAL LOW (ref 12.0–15.0)
Immature Granulocytes: 1 %
Lymphocytes Relative: 8 %
Lymphs Abs: 1.1 10*3/uL (ref 0.7–4.0)
MCH: 29.4 pg (ref 26.0–34.0)
MCHC: 32.5 g/dL (ref 30.0–36.0)
MCV: 90.4 fL (ref 80.0–100.0)
MONO ABS: 1.7 10*3/uL — AB (ref 0.1–1.0)
MONOS PCT: 12 %
Neutro Abs: 10.7 10*3/uL — ABNORMAL HIGH (ref 1.7–7.7)
Neutrophils Relative %: 79 %
PLATELETS: 177 10*3/uL (ref 150–400)
RBC: 3.74 MIL/uL — ABNORMAL LOW (ref 3.87–5.11)
RDW: 13.8 % (ref 11.5–15.5)
WBC: 13.6 10*3/uL — ABNORMAL HIGH (ref 4.0–10.5)
nRBC: 0 % (ref 0.0–0.2)

## 2018-08-24 LAB — I-STAT CG4 LACTIC ACID, ED: LACTIC ACID, VENOUS: 4.26 mmol/L — AB (ref 0.5–1.9)

## 2018-08-24 LAB — MRSA PCR SCREENING: MRSA by PCR: NEGATIVE

## 2018-08-24 LAB — MAGNESIUM: Magnesium: 1.7 mg/dL (ref 1.7–2.4)

## 2018-08-24 MED ORDER — POTASSIUM CHLORIDE CRYS ER 20 MEQ PO TBCR
40.0000 meq | EXTENDED_RELEASE_TABLET | Freq: Once | ORAL | Status: AC
  Administered 2018-08-24: 40 meq via ORAL
  Filled 2018-08-24: qty 2

## 2018-08-24 MED ORDER — POTASSIUM CHLORIDE CRYS ER 20 MEQ PO TBCR
20.0000 meq | EXTENDED_RELEASE_TABLET | Freq: Every day | ORAL | Status: DC
  Administered 2018-08-24: 20 meq via ORAL
  Filled 2018-08-24: qty 1

## 2018-08-24 MED ORDER — OLANZAPINE 5 MG PO TABS
5.0000 mg | ORAL_TABLET | Freq: Every day | ORAL | Status: DC
  Administered 2018-08-24 – 2018-08-30 (×6): 5 mg via ORAL
  Filled 2018-08-24 (×8): qty 1

## 2018-08-24 MED ORDER — SODIUM CHLORIDE 0.9 % IV SOLN
1.0000 g | Freq: Two times a day (BID) | INTRAVENOUS | Status: DC
  Administered 2018-08-24 – 2018-08-26 (×6): 1 g via INTRAVENOUS
  Filled 2018-08-24 (×8): qty 1

## 2018-08-24 MED ORDER — POLYETHYLENE GLYCOL 3350 17 G PO PACK: 17.0000 g | PACK | Freq: Every day | ORAL | Status: DC | PRN

## 2018-08-24 MED ORDER — ENOXAPARIN SODIUM 30 MG/0.3ML ~~LOC~~ SOLN
30.0000 mg | SUBCUTANEOUS | Status: DC
  Administered 2018-08-25: 30 mg via SUBCUTANEOUS
  Filled 2018-08-24: qty 0.3

## 2018-08-24 MED ORDER — VANCOMYCIN HCL IN DEXTROSE 750-5 MG/150ML-% IV SOLN
750.0000 mg | INTRAVENOUS | Status: DC
  Administered 2018-08-25 – 2018-08-27 (×3): 750 mg via INTRAVENOUS
  Filled 2018-08-24 (×3): qty 150

## 2018-08-24 MED ORDER — TRAZODONE HCL 50 MG PO TABS
50.0000 mg | ORAL_TABLET | Freq: Every day | ORAL | Status: DC
  Administered 2018-08-24 – 2018-08-30 (×7): 50 mg via ORAL
  Filled 2018-08-24 (×7): qty 1

## 2018-08-24 MED ORDER — LORAZEPAM 0.5 MG PO TABS
0.5000 mg | ORAL_TABLET | Freq: Four times a day (QID) | ORAL | Status: DC | PRN
  Administered 2018-08-24 – 2018-08-31 (×3): 0.5 mg via ORAL
  Filled 2018-08-24 (×3): qty 1

## 2018-08-24 MED ORDER — SODIUM CHLORIDE 0.9 % IV SOLN
INTRAVENOUS | Status: DC
  Administered 2018-08-24 – 2018-08-26 (×4): via INTRAVENOUS

## 2018-08-24 MED ORDER — MAGNESIUM SULFATE IN D5W 1-5 GM/100ML-% IV SOLN
1.0000 g | Freq: Once | INTRAVENOUS | Status: AC
  Administered 2018-08-24: 1 g via INTRAVENOUS
  Filled 2018-08-24: qty 100

## 2018-08-24 MED ORDER — VANCOMYCIN HCL 10 G IV SOLR
1500.0000 mg | Freq: Once | INTRAVENOUS | Status: AC
  Administered 2018-08-24: 1500 mg via INTRAVENOUS
  Filled 2018-08-24: qty 1500

## 2018-08-24 MED ORDER — SODIUM CHLORIDE 0.9 % IV BOLUS
1000.0000 mL | Freq: Once | INTRAVENOUS | Status: AC
  Administered 2018-08-24: 1000 mL via INTRAVENOUS

## 2018-08-24 MED ORDER — SODIUM CHLORIDE 0.9 % IV SOLN: 1.0000 g | Freq: Two times a day (BID) | INTRAVENOUS | Status: DC

## 2018-08-24 MED ORDER — KETOROLAC TROMETHAMINE 15 MG/ML IJ SOLN
15.0000 mg | Freq: Once | INTRAMUSCULAR | Status: AC
  Administered 2018-08-24: 15 mg via INTRAVENOUS
  Filled 2018-08-24: qty 1

## 2018-08-24 MED ORDER — DIVALPROEX SODIUM 125 MG PO CSDR
250.0000 mg | DELAYED_RELEASE_CAPSULE | Freq: Two times a day (BID) | ORAL | Status: DC
  Administered 2018-08-24 – 2018-08-31 (×15): 250 mg via ORAL
  Filled 2018-08-24 (×17): qty 2

## 2018-08-24 MED ORDER — ENALAPRILAT 1.25 MG/ML IV SOLN
0.6250 mg | Freq: Once | INTRAVENOUS | Status: AC
  Administered 2018-08-24: 0.625 mg via INTRAVENOUS
  Filled 2018-08-24: qty 1

## 2018-08-24 MED ORDER — SODIUM CHLORIDE 0.9 % IV SOLN
INTRAVENOUS | Status: DC | PRN
  Administered 2018-08-24: 04:00:00 via INTRAVENOUS

## 2018-08-24 MED ORDER — ACETAMINOPHEN 325 MG PO TABS
650.0000 mg | ORAL_TABLET | Freq: Four times a day (QID) | ORAL | Status: DC | PRN
  Administered 2018-08-24: 650 mg via ORAL
  Filled 2018-08-24: qty 2

## 2018-08-24 NOTE — ED Notes (Signed)
[facility] MD notified of repeat lactic acid of 4.26

## 2018-08-24 NOTE — H&P (Addendum)
History and Physical  [name] XBM:[id number] DOB: September 21, 1926 DOA: 08/23/2018 2125  Referring physician: Elpidio Anis [facility] ED) PCP: Crist Infante, MD   HISTORY   Chief Complaint: fever   HPI: [name] is a 84 y.o. female  with dementia [facility] hx of breast CA, GERD, HTN, TIA who presents with malaise and found to be febrile to 102 deg. History limited by dementia. Patient does not remember much detail other than that her back and R hip has been hurting for the past week at night. Denies dysuria, cough, or other respiratory symptoms; although patient had reported cough to ED previously. Patient's son [name]) states that patient's lower R leg had a "raw" spot 1 week ago with active purulent drainage. She had been started on doxycycline PO x 1 week for presumed cellulitis. Per EMS, the [facility] staff were unclear as to how long she had been febrile.   Review of Systems: + fever; ?cough (pt reports different symptoms); recent R leg cellulitis - no chest pain, dyspnea on exertion - no edema, PND, orthopnea - no nausea/vomiting; no tarry, melanotic or bloody stools - no dysuria, increased urinary frequency - no weight changes  Rest of systems reviewed are negative, except as per above history.   ED course:  Vitals Blood pressure 133/61, pulse 81, temperature (!) 101.3 F (38.5 C), temperature source Rectal, resp. rate 19, height 5\' 6"  (1.676 m), weight 70.3 kg, SpO2 96 %. En route tylenol 1000mg  by EMS. In ED received azithromycin 500mg  IV x 1; ceftriaxone 1gm x 1; LR bolus x 1 L;    Past Medical History:  Diagnosis Date  . Anxiety   . Arthritis   . Breast cancer ([place])    left  . Cough   . Esophageal stricture   . GERD (gastroesophageal reflux disease)   . Hyperlipidemia   . Hypertension   . Thrombosed external hemorrhoid, R posterior 01/20/2012  . TIA (transient ischemic attack)    Past Surgical History:  Procedure Laterality Date  .  ABDOMINAL HYSTERECTOMY    . BREAST LUMPECTOMY  2010   left  . CHOLECYSTECTOMY  2002  . CYSTECTOMY  in her 26's  . HEEL SPUR SURGERY     right  . MASTECTOMY, PARTIAL     left  . TONSILLECTOMY      Social History:  reports that she has never smoked. She has never used smokeless tobacco. She reports that she does not drink alcohol or use drugs.  Allergies  Allergen Reactions  . Hydrocodone     See 12/24/17 ED note visual hallucinations, combativeness, and recurrent falling on hydrocodone   . Ibandronate Sodium     REACTION: joint aches  . Latex     unsure  . Sulfonamide Derivatives Hives  . Tessalon [Benzonatate]     Fast heart rate, kept pt awake    Family History  Problem Relation Age of Onset  . Lung cancer Brother   . Cervical cancer Sister   . Hypertension Father   . Colon cancer Neg Hx   . Esophageal cancer Neg Hx   . Rectal cancer Neg Hx   . Stomach cancer Neg Hx    Prior to Admission medications   Medication Sig Start Date End Date Taking? Authorizing Provider  acetaminophen (TYLENOL) 325 MG tablet Take 650 mg by mouth every 6 (six) hours as needed for mild pain.   Yes [provider]  amLODipine (NORVASC) 10 MG tablet  Take 10 mg by mouth daily.   Yes [provider]  Cholecalciferol (VITAMIN D3) 2000 UNITS TABS Take 1,000 Units by mouth daily.    Yes [provider]  divalproex (DEPAKOTE SPRINKLE) 125 MG capsule Take 250 mg by mouth 2 (two) times daily.   Yes [provider]  doxycycline (VIBRA-TABS) 100 MG tablet Take 100 mg by mouth 2 (two) times daily.   Yes [provider]  furosemide (LASIX) 40 MG tablet Take 1 tablet (40 mg total) by mouth daily. Patient taking differently: Take 20 mg by mouth every Monday, Wednesday, and Friday.  01/09/18  Yes Medina-Vargas, Monina C, NP  LORazepam (ATIVAN) 0.5 MG tablet Take 0.5 mg by mouth every 6 (six) hours as needed for anxiety.   Yes [provider]  metoprolol  tartrate (LOPRESSOR) 50 MG tablet Take 50 mg by mouth 2 (two) times daily.   Yes [provider]  OLANZapine (ZYPREXA) 5 MG tablet Take 5 mg by mouth at bedtime.   Yes [provider]  potassium chloride SA (K-DUR,KLOR-CON) 20 MEQ tablet Take 1 tablet (20 mEq total) by mouth daily. 01/09/18  Yes Medina-Vargas, Monina C, NP  psyllium (METAMUCIL SMOOTH TEXTURE) 28 % packet Take 1 packet by mouth 2 (two) times daily. 02/16/14  Yes Stark Klein, MD  traZODone (DESYREL) 50 MG tablet Take 50 mg by mouth at bedtime.   Yes [provider]  amitriptyline (ELAVIL) 50 MG tablet Take 1 tablet (50 mg total) by mouth at bedtime as needed for sleep. Patient not taking: Reported on 08/23/2018 01/09/18   Medina-Vargas, Monina C, NP  atenolol (TENORMIN) 50 MG tablet Take 1 tablet (50 mg total) by mouth daily. Patient not taking: Reported on 08/23/2018 01/09/18   Medina-Vargas, Monina C, NP      PHYSICAL EXAM   Temp:  [101.3 F (38.5 C)] 101.3 F (38.5 C) (11/17 2136) Pulse Rate:  [74-90] 81 (11/18 0000) Resp:  [16-22] 19 (11/18 0000) BP: (131-150)/(58-70) 133/61 (11/18 0000) SpO2:  [95 %-97 %] 96 % (11/18 0000) Weight:  [70.3 kg] 70.3 kg (11/17 2139)  BP 133/61 (BP Location: Right Arm)   Pulse 81   Temp (!) 101.3 F (38.5 C) (Rectal)   Resp 19   Ht 5\' 6"  (1.676 m)   Wt 70.3 kg   SpO2 96%   BMI 25.02 kg/m    GEN thin elderly caucasian female; resting in bed comfortably  HEENT NCAT EOM intact PERRL; clear oropharynx, no cervical LAD; dry mucus membranes  JVP estimated 5 cm H2O above RA; no HJR ; no carotid bruits b/l ;  CV regular normal rate; diminished S2; loud 3/6 systolic murmur at RUSB and separate systolic murmur at LLSB radiates throughout precordium; PMI non displaced;   RESP CTA b/l; breathing unlabored and symmetric  ABD soft NT ND +normoactive BS  EXT warm throughout b/l; no peripheral edema b/l  PULSES  DP and radials 2+ intact b/l  SKIN/MSK  Mild erythema around  R lower shin/dorsal ankle with healed scab; no purulence; fluctuance; no tenderness; No tenderness over spine palpation or hip palpation NEURO/PSYCH AAOx1 (name); no gross motor focal deficits   DATA   LABS ON ADMISSION:  Basic Metabolic Panel: Recent Labs  Lab 08/23/18 2138  NA 136  K 2.7*  CL 105  CO2 21*  GLUCOSE 131*  BUN 20  CREATININE 0.89  CALCIUM 8.5*   CBC: Recent Labs  Lab 08/23/18 2138  WBC 14.4*  NEUTROABS 11.3*  HGB 11.1*  HCT 33.5*  MCV 88.6  PLT 187   Liver Function Tests: Recent Labs  Lab 08/23/18 2138  AST 21  ALT 13  ALKPHOS 40  BILITOT 0.5  PROT 6.0*  ALBUMIN 2.9*   No results for input(s): LIPASE, AMYLASE in the last 168 hours. No results for input(s): AMMONIA in the last 168 hours. Coagulation:  No results found for: INR, PROTIME No results found for: PTT Lactic Acid, Venous:     Component Value Date/Time   LATICACIDVEN 2.95 (HH) 08/23/2018 2243   Cardiac Enzymes: No results for input(s): CKTOTAL, CKMB, CKMBINDEX, TROPONINI in the last 168 hours. Urinalysis:    Component Value Date/Time   COLORURINE STRAW (A) 08/23/2018 2138   APPEARANCEUR CLEAR 08/23/2018 2138   LABSPEC 1.008 08/23/2018 2138   PHURINE 7.0 08/23/2018 2138   GLUCOSEU NEGATIVE 08/23/2018 2138   HGBUR NEGATIVE 08/23/2018 2138   BILIRUBINUR NEGATIVE 08/23/2018 2138   KETONESUR NEGATIVE 08/23/2018 2138   PROTEINUR NEGATIVE 08/23/2018 2138   UROBILINOGEN 0.2 07/03/2011 0817   NITRITE NEGATIVE 08/23/2018 2138   LEUKOCYTESUR NEGATIVE 08/23/2018 2138    BNP (last 3 results) No results for input(s): PROBNP in the last 8760 hours. CBG: No results for input(s): GLUCAP in the last 168 hours.  Radiological Exams on Admission: Dg Chest 2 View  Result Date: 08/23/2018 CLINICAL DATA:  Fever EXAM: CHEST - 2 VIEW COMPARISON:  07/04/2018.  Chest CT 10/17/2015 FINDINGS: Cardiomegaly with vascular congestion. Prominent hila bilaterally. When comparing to prior CT, this  likely is related to pulmonary arterial hypertension as seen on prior CT. No confluent opacities or effusions. No acute bony abnormality. IMPRESSION: Cardiomegaly. Bilateral hilar fullness likely related to pulmonary arterial hypertension seen on prior chest CT. Low volumes.  No active disease. Electronically Signed   By: Rolm Baptise M.D.   On: 08/23/2018 22:14    EKG: Independently reviewed. NSR; QTc 450   ASSESSMENT AND PLAN   Assessment: NERIA PROCTER is a 84 y.o. female with dementia [facility] resident), hx of breast CA, GERD, HTN, TIA who presents with malaise and found to be febrile to 102 deg. Unclear source at this time. She was recently treated for R lower leg cellulitis with PO doxycycline -- cellulitis actually appears to be resolving. Chart review shows patient has had UTIs in the past, although denies any recent urinary symptoms and bland UA. Patient not reliable historian due to dementia. Intermittent cough reported by ED although unchanged CXR and no respiratory sx on initial exam. Thus will treat broadly including for MRSA given recent cellulitis and nursing home exposure. Hemodynamically stable.    Active Problems:   Sepsis ([place])  Plan:   # Sepsis of unknown etiology. Resp (viral vs atypical bacterial) vs urinary vs cellulitis/skin source.  > T 101.3 on admission; lactate 2.95; WBC 14; BP stable 140-150s - blood and urine cultures pending - broad spec abx: starting cefepime and vancomycin (per pharmacy) IV (D1 11/18)  - repeat lactate in AM - currently hemodynamically stable - influenza panel pending - fluid resuscitation as per vitals - low threshold to obtain further imaging if focal sx arise and if blood cultures are positive   # Dementia and hx of sundowning/agitation > resident of [place] - resume home olanzapine 5mg  nightly - resume prn ativan 0.5mg  q6h prn anxiety - resume depakote 250mg  BID - resume trazodone 50mg  nightly  # Hypokalemia -  acute on chronic > K 2.7 on admission - K repletion with  IV and PO overnight - resume home PO K 20 meQ - telemetry overnight  # HTN  - hold home metoprolol given concern for sepsis  - hold home amlodipine 10mg  unless persistently hypertensive  - clarify with ALF if pt on atenolol vs metoprolol   # Resolving RLE cellulitis > had completed ?days of doxycycline at nursing home  - appears to have improved compared to what family member was describing last week  - on broad spec abx as above     # Loud systolic murmur suggestive of AS. May also have pulmonary HTN with TR component given dilated pulm artery on previous. No recent echo  - can follow as outpatient. would not recommend definitive treatment given goals of care   DVT Prophylaxis: lovenox  Code Status:  DNR Family Communication: son [name]) by phone  Disposition Plan: admit to telemetry floor for sepsis of unknown origin   Patient contact: Extended Emergency Contact Information Primary Emergency Contact: Dayal,[name] Address: [address]          [place], [place] [address] [place] Phone: [phone] Mobile Phone: [phone] Relation: Spouse Secondary Emergency Contact: [place],[name] Mobile Phone: [phone] Relation: Son  Time spent: > 35 mins  Colbert Ewing, MD [facility] Pager [phone]  If 7PM-7AM, please contact night-coverage [email or web address] Password [email or web address] 08/24/2018, 12:15 AM

## 2018-08-24 NOTE — Progress Notes (Signed)
Pharmacy Antibiotic Note  [name] is a 84 y.o. female admitted on 08/23/2018 with sepsis.  Pharmacy has been consulted for vancomycin dosing.  Plan: Cefepime 1 Gm IV q12h (MD) Vancomycin 1500 mg x1 then 750 mg IV q24h for est AUC = 494 Goal AUC = 400-500 F/u scr/cultures/levels  Height: 5\' 6"  (167.6 cm) Weight: 155 lb (70.3 kg) IBW/kg (Calculated) : 59.3  Temp (24hrs), Avg:101.3 F (38.5 C), Min:101.3 F (38.5 C), Max:101.3 F (38.5 C)  Recent Labs  Lab 08/23/18 2138 08/23/18 2243  WBC 14.4*  --   CREATININE 0.89  --   LATICACIDVEN  --  2.95*    Estimated Creatinine Clearance: 39.3 mL/min (by C-G formula based on SCr of 0.89 mg/dL).    Allergies  Allergen Reactions  . Hydrocodone     See 12/24/17 ED note visual hallucinations, combativeness, and recurrent falling on hydrocodone   . Ibandronate Sodium     REACTION: joint aches  . Latex     unsure  . Sulfonamide Derivatives Hives  . Tessalon [Benzonatate]     Fast heart rate, kept pt awake    Antimicrobials this admission: 11/17 rocephin >> x1 ED 11/18 cefepime >> 11/18 vancomycin >>   Dose adjustments this admission:   Microbiology results:  BCx:   UCx:    Sputum:   MRSA PCR:   Thank you for allowing pharmacy to be a part of this patient's care.  Dorrene German 08/24/2018 12:25 AM

## 2018-08-24 NOTE — Progress Notes (Signed)
CRITICAL VALUE ALERT  Critical Value: Lactic Acid 2.9 (improving)  Date & Time Notied:  08/24/2018 @ 0608  Provider Notified: paged  Orders Received/Actions taken: repeat labs cyclically

## 2018-08-24 NOTE — Progress Notes (Signed)
Attending MD note  Patient was seen, examined,treatment plan was discussed with the PA-S.  I have personally reviewed the clinical findings, lab, imaging studies and management of this patient in detail. I agree with the documentation, as recorded by the PA-S  [name] is a 84 y.o. year old female with medical history significant for dementia [facility] resident), hx of breast CA, GERD, HTN, TIA who presented on 08/23/2018 with fever and generalized malaise and was found to have severe sepsis of unclear etiology.  History limited by patient with dementia and acute confusion in setting of likely infection.  Per chart review patient was previously treated with doxycycline for reported lower leg cellulitis a week prior to presentation.  Per staffing at SNF patient had fever for unknown period of time.  ED course: T-max one 1.3, tachypnea to 22, initial blood pressure 150/70.  Lab work significant for lactic acid of 2.95, WBC 14.4, creatinine 0.89,Potassium 2.7, UA negative for signs of infection. Chest x-ray with no active disease but bilateral hilar fullness/related to pulmonary arterial hypertension. In the ED patient received 1 L LR bolus for lactic acidosis and empirically started on ceftriaxone and azithromycin after obtaining blood cultures x2.  Hospital course: Patient was initially admitted to floor but found to have low blood pressure with SBP's in the 90s as well as worsening lactic acidosis.  Patient was broadened out to vancomycin and cefepime and transferred to stepdown unit. Of note, on admission blood pressure was initially 140s over 90s.  Later in the morning blood pressure found to be 200s over 100s and patient received enalaprilat IV x1.  Within 2 hours blood pressure dropped to 90s over 50s requiring fluid boluses in the setting of worsening lactic acidosis.  Physical: Vitals:   08/24/18 1330 08/24/18 1400  BP: (!) 152/60 (!) 162/58  Pulse: 65 67  Resp: (!) 22 20    Temp:    SpO2: 99% 99%    Constitutional: Elderly female, lying in bed, no distress Eyes: EOMI, anicteric, normal conjunctivae Cardiovascular: RRR, loud SEM, with no peripheral edema Respiratory: Normal respiratory effort on room air, clear breath sounds  Abdomen: Soft,non-tender,  Skin: Small abrasion on right lower leg with no surrounding erythema or drainage. Without skin tenting  Neurologic: Grossly no focal neuro deficit. Psychiatric: Somnolent but easily arousable, oriented to self only  Plan  Severe sepsis, Improving.  Lactic acid downtrending from 4.95-2.9 with IV fluids, patient now afebrile, Still with leukocytosis.  Unclear etiology as chest x-ray with no acute findings and patient with no respiratory symptoms.  UA unremarkable and urine and blood culture still pending.  Reports of concern for right lower extremity cellulitis however not very impressive on my exam but given patient was previously treated with doxycycline and severity of symptoms must rule out MRSA before de-escalating antibiotic coverage. -continue broad vancomycin and cefepime given severe systemic symptoms and unclear etiology.   -Continue time IV fluids and repeat lactic acid.  Hypotension, improving maps currently greater than 65 Initially patient presented with somewhat normal blood pressure which quickly escalated to concern for hypertensive emergency with SBP of 283 and diastolic of 1 teens.  Patient received enalaprilat IV x1 and subsequently developed blood pressure with SBP's in the 90s on admission. -Hold off on further home antihypertensive medications -Status post fluid boluses -Continue maintenance IV fluids for goal maps greater than 65  Acute metabolic/toxic encephalopathy.  Alert but not oriented to person place or time.  Likely delirium in the setting  of severe sepsis and patient with known dementia.  Otherwise no acute neurologic findings. -Delirium precautions -Treat sepsis as mentioned  above, expect improvement with such - Speak with family to obtain baseline mental status - Continue Zyprexa daily  Hypokalemia.  Takes oral potassium at home in the setting of Lasix use.  Currently holding Lasix given hypotensive episodes during admission.  Repleted orally and via IV -Repeat BMP and supplement as needed -Continue to monitor on telemetry  Systolic ejection murmur.  Very impressive on cardiac exam.  Possible aortic stenosis versus tricuspid regurg in the setting of pulmonary hypertension as evident by chest x-ray.  No active need for echo currently will pursue with patient bacteremic or persist to have fever that can suggest possible endocarditis -Continue to monitor  Rest as below  [facility]   PROGRESS NOTE    [name]  NKN:[id number] DOB: 27-Jul-1926 DOA: 08/23/2018 PCP: Crist Infante, MD    Brief Narrative: [name] is a 84 y.o patient with a history of breast cancer, GERD, HTN, TIA and dementia. Per last note, patient presented with a general weakness that has been occurring for a week as well as a back and right hip pain that is worse at night. Patient's daughter mentioned that the patient had a red spot with purulent discharge on the right lower leg that has been treated with doxycycline x a week for presumed cellulitis (history taken from last note as patient was not able to give history because of dementia). In the ED, patient had a temp of 101.3, Resp 22 and BP 150/70 at first encounter and placed on azithromycin and ceftriaxone. Then, azithromycin and ceftriaxone has been replaced by cefepime and vancomycin. Also, placed on IV fluid as lactic acid was 2.95 and WBC was 14, and  admitted to the hospital per sepsis protocol. Since admission, patient has continued to be treated with IV antibiotics and fluids. Lactic acid (2.95-->4.26-->2.9) and WBC (14-->13.6) are still elevated. UA is negative for UTI. Flu is negative.  Chest x-ray do not  show acute infection.      Assessment & Plan:    Severe Sepsis: Sepsis has not been resolved. Patient is no longer febrile. However, still has elevated WBC (14-->13.6), lactic acid (2.95-->4.26--> 2.9), low BP of 92/33 and is tachypneic. Also, urine culture is positive for E.coli. Will continue on cefepime and vancomycin until MRSA is ruled out and sepsis cleared up.   Hypokalemia, acute on chronic: Last reported K level is 2.9. Has been treated with K. Will check BMP in the morning.   Dementia and hx of sundowning/agitation: no acute change has been reported by nurses. Continue on Olanzapine 5 mg PO daily at bedtime, Ativan 0.5 mg PRN for anxiety, Depakote 250 mg BID, and trazodone 50 PO at bedtime.   Hypertension: Metoprolol and amlodipine has been discontinued as patient was in sepsis at admission. However, her BP went into 200s 4 am in the morning. As a result, she was given enalaprilat IV. Since then, BP has been running very low.  Last reported BP is 92/33. Continue monitoring BP levels. No need to initiate antihypertensive medication at this point.   Right lower extremity cellulitis: Per last note patient was treated with 1 week of doxycycline and the cellulitis has been resolving.       DVT prophylaxis: Levenox 30 mg Code Status: DNR Family Communication: No family was present at bedside Disposition Plan: will be discharged after once vitals stabilize and infection cleared  up.    Consultants: None  Procedures: None  Antimicrobials: None   Subjective: Patient was not able to give any history because of dementia.   Objective: Vitals:   08/24/18 0620 08/24/18 0630 08/24/18 0650 08/24/18 0700  BP:  (!) 128/30  (!) 92/33  Pulse: 67 73 64 64  Resp: 16 20 19  (!) 26  Temp:      TempSrc:      SpO2: 97% 96% 96% 96%  Weight:      Height:        Intake/Output Summary (Last 24 hours) at 08/24/2018 1245 Last data filed at 08/24/2018 0700 Gross per 24 hour  Intake 4361.2  ml  Output 2050 ml  Net 2311.2 ml   Filed Weights   08/23/18 2139 08/24/18 0243  Weight: 70.3 kg 68.3 kg    Examination:  General exam: Appears sleepy and disoriented  Respiratory system: Clear to auscultation. Respiratory effort normal. Cardiovascular system: S1 & S2 heard, RRR. has murmur on right upper side,  , rubs, gallops or clicks. No pedal edema. Gastrointestinal system: Abdomen is nondistended, soft and nontender. No organomegaly or masses felt. Normal bowel sounds heard. Central nervous system:  No focal neurological deficits but disoriented result of dementia Extremities: Symmetric 5 x 5 power. positive mild erythema on right lower leg Skin: No rashes, lesions or ulcers Psychiatry: Judgement and insight appear normal. Mood & affect appropriate.     Data Reviewed: I have personally reviewed following labs and imaging studies  CBC: Recent Labs  Lab 08/23/18 2138 08/24/18 0031 08/24/18 0525  WBC 14.4* 14.4* 13.6*  NEUTROABS 11.3*  --  10.7*  HGB 11.1* 10.4* 11.0*  HCT 33.5* 31.6* 33.8*  MCV 88.6 89.8 90.4  PLT 187 184 809   Basic Metabolic Panel: Recent Labs  Lab 08/23/18 2138 08/24/18 0031 08/24/18 0525  NA 136  --  139  K 2.7*  --  2.9*  CL 105  --  110  CO2 21*  --  17*  GLUCOSE 131*  --  111*  BUN 20  --  12  CREATININE 0.89 0.89 0.68  CALCIUM 8.5*  --  7.8*  MG  --  1.7  --    GFR: Estimated Creatinine Clearance: 42.1 mL/min (by C-G formula based on SCr of 0.68 mg/dL). Liver Function Tests: Recent Labs  Lab 08/23/18 2138  AST 21  ALT 13  ALKPHOS 40  BILITOT 0.5  PROT 6.0*  ALBUMIN 2.9*   No results for input(s): LIPASE, AMYLASE in the last 168 hours. No results for input(s): AMMONIA in the last 168 hours. Coagulation Profile: No results for input(s): INR, PROTIME in the last 168 hours. Cardiac Enzymes: No results for input(s): CKTOTAL, CKMB, CKMBINDEX, TROPONINI in the last 168 hours. BNP (last 3 results) No results for input(s):  PROBNP in the last 8760 hours. HbA1C: No results for input(s): HGBA1C in the last 72 hours. CBG: No results for input(s): GLUCAP in the last 168 hours. Lipid Profile: No results for input(s): CHOL, HDL, LDLCALC, TRIG, CHOLHDL, LDLDIRECT in the last 72 hours. Thyroid Function Tests: No results for input(s): TSH, T4TOTAL, FREET4, T3FREE, THYROIDAB in the last 72 hours. Anemia Panel: No results for input(s): VITAMINB12, FOLATE, FERRITIN, TIBC, IRON, RETICCTPCT in the last 72 hours. Urine analysis:    Component Value Date/Time   COLORURINE STRAW (A) 08/23/2018 2138   APPEARANCEUR CLEAR 08/23/2018 2138   LABSPEC 1.008 08/23/2018 2138   PHURINE 7.0 08/23/2018 2138   GLUCOSEU NEGATIVE 08/23/2018  2138   HGBUR NEGATIVE 08/23/2018 2138   BILIRUBINUR NEGATIVE 08/23/2018 2138   KETONESUR NEGATIVE 08/23/2018 2138   PROTEINUR NEGATIVE 08/23/2018 2138   UROBILINOGEN 0.2 07/03/2011 0817   NITRITE NEGATIVE 08/23/2018 2138   LEUKOCYTESUR NEGATIVE 08/23/2018 2138   Sepsis Labs: @LABRCNTIP (procalcitonin:4,lacticidven:4)  ) Recent Results (from the past 240 hour(s))  Blood Culture (routine x 2)     Status: None (Preliminary result)   Collection Time: 08/23/18  9:56 PM  Result Value Ref Range Status   Specimen Description BLOOD RIGHT FOREARM  Final   Special Requests   Final    BOTTLES DRAWN AEROBIC AND ANAEROBIC Blood Culture adequate volume   Culture PENDING  Incomplete   Report Status PENDING  Incomplete  MRSA PCR Screening     Status: None   Collection Time: 08/24/18  2:45 AM  Result Value Ref Range Status   MRSA by PCR NEGATIVE NEGATIVE Final    Comment:        The GeneXpert MRSA Assay (FDA approved for NASAL specimens only), is one component of a comprehensive MRSA colonization surveillance program. It is not intended to diagnose MRSA infection nor to guide or monitor treatment for MRSA infections. Performed at [facility], [place] [address], [place],  [place] [address]          Radiology Studies: Dg Chest 2 View  Result Date: 08/23/2018 CLINICAL DATA:  Fever EXAM: CHEST - 2 VIEW COMPARISON:  07/04/2018.  Chest CT 10/17/2015 FINDINGS: Cardiomegaly with vascular congestion. Prominent hila bilaterally. When comparing to prior CT, this likely is related to pulmonary arterial hypertension as seen on prior CT. No confluent opacities or effusions. No acute bony abnormality. IMPRESSION: Cardiomegaly. Bilateral hilar fullness likely related to pulmonary arterial hypertension seen on prior chest CT. Low volumes.  No active disease. Electronically Signed   By: Rolm Baptise M.D.   On: 08/23/2018 22:14        Scheduled Meds: . divalproex  250 mg Oral BID  . [START ON 08/25/2018] enoxaparin (LOVENOX) injection  30 mg Subcutaneous Q24H  . OLANZapine  5 mg Oral QHS  . potassium chloride SA  20 mEq Oral Daily  . traZODone  50 mg Oral QHS   Continuous Infusions: . sodium chloride 10 mL/hr at 08/24/18 0415  . sodium chloride 75 mL/hr at 08/24/18 0428  . ceFEPime (MAXIPIME) IV Stopped (08/24/18 0650)  . [START ON 08/25/2018] vancomycin       LOS: 0 days    Time spent: 25 minutes    [place],

## 2018-08-24 NOTE — ED Notes (Signed)
ED TO INPATIENT HANDOFF REPORT  Name/Age/Gender [name] 84 y.o. female  Code Status    Code Status Orders  (From admission, onward)         Start     Ordered   08/24/18 0008  Do not attempt resuscitation (DNR)  Continuous    Question Answer Comment  In the event of cardiac or respiratory ARREST Do not call a "code blue"   In the event of cardiac or respiratory ARREST Do not perform Intubation, CPR, defibrillation or ACLS   In the event of cardiac or respiratory ARREST Use medication by any route, position, wound care, and other measures to relive pain and suffering. May use oxygen, suction and manual treatment of airway obstruction as needed for comfort.      08/24/18 0012        Code Status History    Date Active Date Inactive Code Status Order ID Comments User Context   07/03/2018 1532 07/07/2018 0006 Full Code [id number]  Valarie Merino, MD ED   12/18/2017 0244 12/18/2017 1957 Full Code [id number]  Rise Patience, MD ED    Advance Directive Documentation     Most Recent Value  Type of Advance Directive  Out of facility DNR (pink MOST or yellow form)  Pre-existing out of facility DNR order (yellow form or pink MOST form)  Yellow form placed in chart (order not valid for inpatient use)  "MOST" Form in Place?  -      Home/SNF/Other Skilled nursing facility  Chief Complaint Fever  Level of Care/Admitting Diagnosis ED Disposition    ED Disposition Condition [place]: [facility] [[id number]]  Level of Care: Telemetry [5]  Admit to tele based on following criteria: Other see comments  Comments: sepsis  Diagnosis: Sepsis [facility]) [[id number]]  Admitting Physician: Colbert Ewing [[id number]]  Attending Physician: Colbert Ewing [[id number]]  Estimated length of stay: past midnight tomorrow  Certification:: I certify this patient will need inpatient services for at least 2 midnights  PT Class (Do Not Modify): Inpatient [101]  PT Acc Code (Do Not Modify): Private [1]       Medical History Past Medical History:  Diagnosis Date  . Anxiety   . Arthritis   . Breast cancer ([place])    left  . Cough   . Esophageal stricture   . GERD (gastroesophageal reflux disease)   . Hyperlipidemia   . Hypertension   . Thrombosed external hemorrhoid, R posterior 01/20/2012  . TIA (transient ischemic attack)     Allergies Allergies  Allergen Reactions  . Hydrocodone     See 12/24/17 ED note visual hallucinations, combativeness, and recurrent falling on hydrocodone   . Ibandronate Sodium     REACTION: joint aches  . Latex     unsure  . Sulfonamide Derivatives Hives  . Tessalon [Benzonatate]     Fast heart rate, kept pt awake    IV Location/Drains/Wounds Patient Lines/Drains/Airways Status   Active Line/Drains/Airways    Name:   Placement date:   Placement time:   Site:   Days:   Peripheral IV 08/23/18 Left Forearm   08/23/18    2112    Forearm   1   Peripheral IV 08/23/18 Right Hand   08/23/18    2154    Hand   1          Labs/Imaging Results for orders placed or performed during the hospital encounter of 08/23/18 (from  the past 48 hour(s))  Comprehensive metabolic panel     Status: Abnormal   Collection Time: 08/23/18  9:38 PM  Result Value Ref Range   Sodium 136 135 - 145 mmol/L   Potassium 2.7 (LL) 3.5 - 5.1 mmol/L    Comment: CRITICAL RESULT CALLED TO, READ BACK BY AND VERIFIED WITH: GIRFO,A AT 2325 ON 08/23/2018 BY MOSLEY,J    Chloride 105 98 - 111 mmol/L   CO2 21 (L) 22 - 32 mmol/L   Glucose, Bld 131 (H) 70 - 99 mg/dL   BUN 20 8 - 23 mg/dL   Creatinine, Ser 0.89 0.44 - 1.00 mg/dL   Calcium 8.5 (L) 8.9 - 10.3 mg/dL   Total Protein 6.0 (L) 6.5 - 8.1 g/dL   Albumin 2.9 (L) 3.5 - 5.0 g/dL   AST 21 15 - 41 U/L   ALT 13 0 - 44 U/L   Alkaline Phosphatase 40 38 - 126 U/L   Total Bilirubin 0.5 0.3 - 1.2 mg/dL   GFR calc non Af Amer 55 (L) >60 mL/min   GFR calc Af Amer >60 >60 mL/min    Comment:  (NOTE) The eGFR has been calculated using the CKD EPI equation. This calculation has not been validated in all clinical situations. eGFR's persistently <60 mL/min signify possible Chronic Kidney Disease.    Anion gap 10 5 - 15    Comment: Performed at [facility], [place] [address], [place], [place] [address]  CBC WITH DIFFERENTIAL     Status: Abnormal   Collection Time: 08/23/18  9:38 PM  Result Value Ref Range   WBC 14.4 (H) 4.0 - 10.5 K/uL   RBC 3.78 (L) 3.87 - 5.11 MIL/uL   Hemoglobin 11.1 (L) 12.0 - 15.0 g/dL   HCT 33.5 (L) 36.0 - 46.0 %   MCV 88.6 80.0 - 100.0 fL   MCH 29.4 26.0 - 34.0 pg   MCHC 33.1 30.0 - 36.0 g/dL   RDW 13.7 11.5 - 15.5 %   Platelets 187 150 - 400 K/uL   nRBC 0.0 0.0 - 0.2 %   Neutrophils Relative % 78 %   Neutro Abs 11.3 (H) 1.7 - 7.7 K/uL   Lymphocytes Relative 9 %   Lymphs Abs 1.3 0.7 - 4.0 K/uL   Monocytes Relative 12 %   Monocytes Absolute 1.7 (H) 0.1 - 1.0 K/uL   Eosinophils Relative 0 %   Eosinophils Absolute 0.0 0.0 - 0.5 K/uL   Basophils Relative 0 %   Basophils Absolute 0.0 0.0 - 0.1 K/uL   Immature Granulocytes 1 %   Abs Immature Granulocytes 0.08 (H) 0.00 - 0.07 K/uL    Comment: Performed at [facility], [place] [address] [place], [place] [address]  Urinalysis, Routine w reflex microscopic     Status: Abnormal   Collection Time: 08/23/18  9:38 PM  Result Value Ref Range   Color, Urine STRAW (A) YELLOW   APPearance CLEAR CLEAR   Specific Gravity, Urine 1.008 1.005 - 1.030   pH 7.0 5.0 - 8.0   Glucose, UA NEGATIVE NEGATIVE mg/dL   Hgb urine dipstick NEGATIVE NEGATIVE   Bilirubin Urine NEGATIVE NEGATIVE   Ketones, ur NEGATIVE NEGATIVE mg/dL   Protein, ur NEGATIVE NEGATIVE mg/dL   Nitrite NEGATIVE NEGATIVE   Leukocytes, UA NEGATIVE NEGATIVE    Comment: Performed at [place] [address], [place], [place] [address]  I-Stat CG4 Lactic Acid, ED     Status: Abnormal  Collection Time: 08/23/18 10:43 PM  Result Value Ref Range   Lactic Acid, Venous 2.95 (HH) 0.5 - 1.9 mmol/L   Comment NOTIFIED PHYSICIAN    Dg Chest 2 View  Result Date: 08/23/2018 CLINICAL DATA:  Fever EXAM: CHEST - 2 VIEW COMPARISON:  07/04/2018.  Chest CT 10/17/2015 FINDINGS: Cardiomegaly with vascular congestion. Prominent hila bilaterally. When comparing to prior CT, this likely is related to pulmonary arterial hypertension as seen on prior CT. No confluent opacities or effusions. No acute bony abnormality. IMPRESSION: Cardiomegaly. Bilateral hilar fullness likely related to pulmonary arterial hypertension seen on prior chest CT. Low volumes.  No active disease. Electronically Signed   By: Rolm Baptise M.D.   On: 08/23/2018 22:14    Pending Labs Unresulted Labs (From admission, onward)    Start     Ordered   08/31/18 0500  Creatinine, serum  (enoxaparin (LOVENOX)    CrCl < 30 ml/min)  Weekly,   R    Comments:  while on enoxaparin therapy.    08/24/18 0012   08/25/18 0500  Magnesium  Daily,   R     08/24/18 0012   08/24/18 7322  Basic metabolic panel  Daily,   R     08/24/18 0012   08/24/18 0500  CBC with Differential/Platelet  Daily,   R     08/24/18 0012   08/24/18 0500  Lactic acid, plasma  Tomorrow morning,   R     08/24/18 0022   08/24/18 0012  Magnesium  Add-on,   R     08/24/18 0012   08/24/18 0007  CBC  (enoxaparin (LOVENOX)    CrCl < 30 ml/min)  Once,   R    Comments:  Baseline for enoxaparin therapy IF NOT ALREADY DRAWN.  Notify MD if PLT < 100 K.    08/24/18 0012   08/24/18 0007  Creatinine, serum  (enoxaparin (LOVENOX)    CrCl < 30 ml/min)  Once,   R    Comments:  Baseline for enoxaparin therapy IF NOT ALREADY DRAWN.    08/24/18 0012   08/23/18 2139  Urine culture  ONCE - STAT,   STAT     08/23/18 2140   08/23/18 2139  Influenza panel by PCR (type A & B)  (Influenza PCR Panel)  Once,   R     08/23/18 2140   08/23/18 2138  Blood Culture (routine x 2)  BLOOD  CULTURE X 2,   STAT     08/23/18 2140          Vitals/Pain Today's Vitals   08/23/18 2236 08/23/18 2300 08/23/18 2330 08/24/18 0000  BP: (!) 150/66 132/62 (!) 131/58 133/61  Pulse: 80 74 85 81  Resp: _0 Temp:      TempSrc:      SpO2: 97% 95% 95% 96%  Weight:      Height:      PainSc:        Isolation Precautions No active isolations  Medications Medications  azithromycin (ZITHROMAX) 500 mg in sodium chloride 0.9 % 250 mL IVPB (500 mg Intravenous New Bag/Given 08/23/18 2337)  potassium chloride 10 mEq in 100 mL IVPB (has no administration in time range)  acetaminophen (TYLENOL) tablet 650 mg (has no administration in time range)  divalproex (DEPAKOTE SPRINKLE) capsule 250 mg (has no administration in time range)  LORazepam (ATIVAN) tablet 0.5 mg (has no administration in time range)  OLANZapine (ZYPREXA) tablet 5 mg (has no  administration in time range)  potassium chloride SA (K-DUR,KLOR-CON) CR tablet 20 mEq (has no administration in time range)  traZODone (DESYREL) tablet 50 mg (has no administration in time range)  enoxaparin (LOVENOX) injection 30 mg (has no administration in time range)  polyethylene glycol (MIRALAX / GLYCOLAX) packet 17 g (has no administration in time range)  ceFEPIme (MAXIPIME) 1 g in sodium chloride 0.9 % 100 mL IVPB (has no administration in time range)  potassium chloride SA (K-DUR,KLOR-CON) CR tablet 40 mEq (has no administration in time range)  vancomycin (VANCOCIN) 1,500 mg in sodium chloride 0.9 % 500 mL IVPB (has no administration in time range)  lactated ringers bolus 1,000 mL (1,000 mLs Intravenous New Bag/Given 08/23/18 2223)  cefTRIAXone (ROCEPHIN) 1 g in sodium chloride 0.9 % 100 mL IVPB (0 g Intravenous Stopped 08/23/18 2303)    Mobility walks with device

## 2018-08-25 ENCOUNTER — Inpatient Hospital Stay (HOSPITAL_COMMUNITY): Payer: PPO

## 2018-08-25 DIAGNOSIS — E861 Hypovolemia: Secondary | ICD-10-CM

## 2018-08-25 DIAGNOSIS — M25571 Pain in right ankle and joints of right foot: Secondary | ICD-10-CM

## 2018-08-25 DIAGNOSIS — I9589 Other hypotension: Secondary | ICD-10-CM

## 2018-08-25 DIAGNOSIS — G9341 Metabolic encephalopathy: Secondary | ICD-10-CM

## 2018-08-25 LAB — CBC WITH DIFFERENTIAL/PLATELET
ABS IMMATURE GRANULOCYTES: 0.08 10*3/uL — AB (ref 0.00–0.07)
BASOS PCT: 0 %
Basophils Absolute: 0 10*3/uL (ref 0.0–0.1)
Eosinophils Absolute: 0.4 10*3/uL (ref 0.0–0.5)
Eosinophils Relative: 3 %
HCT: 31.3 % — ABNORMAL LOW (ref 36.0–46.0)
Hemoglobin: 10.2 g/dL — ABNORMAL LOW (ref 12.0–15.0)
IMMATURE GRANULOCYTES: 1 %
LYMPHS PCT: 21 %
Lymphs Abs: 2.6 10*3/uL (ref 0.7–4.0)
MCH: 29.4 pg (ref 26.0–34.0)
MCHC: 32.6 g/dL (ref 30.0–36.0)
MCV: 90.2 fL (ref 80.0–100.0)
Monocytes Absolute: 1.6 10*3/uL — ABNORMAL HIGH (ref 0.1–1.0)
Monocytes Relative: 13 %
NEUTROS ABS: 7.6 10*3/uL (ref 1.7–7.7)
NEUTROS PCT: 62 %
PLATELETS: 169 10*3/uL (ref 150–400)
RBC: 3.47 MIL/uL — AB (ref 3.87–5.11)
RDW: 14.5 % (ref 11.5–15.5)
WBC: 12.3 10*3/uL — ABNORMAL HIGH (ref 4.0–10.5)
nRBC: 0 % (ref 0.0–0.2)

## 2018-08-25 LAB — BASIC METABOLIC PANEL
ANION GAP: 8 (ref 5–15)
BUN: 12 mg/dL (ref 8–23)
CHLORIDE: 111 mmol/L (ref 98–111)
CO2: 19 mmol/L — ABNORMAL LOW (ref 22–32)
Calcium: 7.8 mg/dL — ABNORMAL LOW (ref 8.9–10.3)
Creatinine, Ser: 0.57 mg/dL (ref 0.44–1.00)
GFR calc Af Amer: >60 mL/min (ref >60)
Glucose, Bld: 88 mg/dL (ref 70–99)
POTASSIUM: 3 mmol/L — AB (ref 3.5–5.1)
Sodium: 138 mmol/L (ref 135–145)

## 2018-08-25 LAB — URINE CULTURE: Culture: NO GROWTH

## 2018-08-25 LAB — MAGNESIUM: MAGNESIUM: 2 mg/dL (ref 1.7–2.4)

## 2018-08-25 MED ORDER — AMLODIPINE BESYLATE 10 MG PO TABS
10.0000 mg | ORAL_TABLET | Freq: Every day | ORAL | Status: DC
  Administered 2018-08-25 – 2018-08-31 (×7): 10 mg via ORAL
  Filled 2018-08-25 (×7): qty 1

## 2018-08-25 MED ORDER — HYDRALAZINE HCL 20 MG/ML IJ SOLN: 5.0000 mg | Freq: Four times a day (QID) | INTRAMUSCULAR | Status: DC | PRN

## 2018-08-25 MED ORDER — POTASSIUM CHLORIDE CRYS ER 20 MEQ PO TBCR
60.0000 meq | EXTENDED_RELEASE_TABLET | Freq: Every day | ORAL | Status: AC
  Administered 2018-08-25: 60 meq via ORAL
  Filled 2018-08-25: qty 3

## 2018-08-25 MED ORDER — ENOXAPARIN SODIUM 40 MG/0.4ML ~~LOC~~ SOLN
40.0000 mg | Freq: Every day | SUBCUTANEOUS | Status: DC
  Administered 2018-08-26 – 2018-08-31 (×6): 40 mg via SUBCUTANEOUS
  Filled 2018-08-25 (×6): qty 0.4

## 2018-08-25 NOTE — Progress Notes (Signed)
Notified MD Nettey in regards to patient's HTN and also right ankle redness and swelling. MD Nettey placed orders for HTN and X-ray to evaluate right ankle. Will continue to monitor and assess.

## 2018-08-25 NOTE — Clinical Social Work Note (Signed)
Clinical Social Work Assessment  Patient Details  Name: [name] MRN: [id number] Date of Birth: 16-Aug-1926  Date of referral:  08/25/18               Reason for consult:  Facility Placement, Other (Comment Required)                Permission sought to share information with:  Facility Sport and exercise psychologist, Case Optician, dispensing granted to share information::  Yes, Verbal Permission Granted  Name::        Agency::     Relationship::     Contact Information:     Housing/Transportation Living arrangements for the past 2 months:  Assisted Living Facility(Patient has transitioned from [place] from home with an intermittent 2 week gero-psych hospitalization at [facility], Adult Children Patient Interpreter Needed:  None Criminal Activity/Legal Involvement Pertinent to Current Situation/Hospitalization:  No - Comment as needed Significant Relationships:  Adult Children, Spouse Lives with:  Facility Resident Do you feel safe going back to the place where you live?  Yes Need for family participation in patient care:  Yes (Comment)(Patient and spouse have dementia. Son, Shanon Brow, is POA)  Care giving concerns:   Patient is 84 year old female admitted to [facility] for sepsis, fever, and continued medical work up. Patient has dementia, as does her spouse. Son, Shanon Brow, is P.O.A.   Social Worker assessment / plan:  CSW familiar with patient and son Shanon Brow from a prior ED encounter. CSW contacted son to gather information for assessment via telephone.  In the last two months, patient has transitioned from living at home with family (son and spouse) to [place]. During the transition, patient spent 2 weeks at [facility] for a gero-psych admission.   The patient has been to SNF once in the past, per son. Patient was at [facility] in March following a fall. Per son, if patient were to be recommended for SNF,  [facility] would be his preferred facility as it is close to family.   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  Patient/Family's Understanding of and Emotional Response to Diagnosis, Current Treatment, and Prognosis:   Family continuing to monitor patient progress. Family is receptive to SNF or return to [facility].  Emotional Assessment Appearance:  Appears stated age Attitude/Demeanor/Rapport:    Affect (typically observed):  Unable to Assess Orientation:  Oriented to Self Alcohol / Substance use:    Psych involvement (Current and /or in the community):  Yes (Comment)(Gero-psych for dementia)  Discharge Needs  Concerns to be addressed:  Cognitive Concerns, Discharge Planning Concerns Readmission within the last 30 days:  No Current discharge risk:  Cognitively Impaired Barriers to Discharge:  Continued Medical Work up   [employer], [place] 08/25/2018, 4:27 PM

## 2018-08-25 NOTE — Progress Notes (Signed)
Patient transferred from ICU. Vital signs stable. Alert to self.

## 2018-08-25 NOTE — Care Management Note (Signed)
Case Management Note  Patient Details  Name: [name] MRN: [id number] Date of Birth: 1926-03-11  Subjective/Objective:     84 y.o patient with a history of breast cancer, GERD, HTN, TIA and dementia              Severe sepsis, Improving Hypotension, improving maps currently greater than 65 Acute metabolic/toxic encephalopathy Systolic ejection murmur Action/Plan: Will follow for progression of care and clinical status. Will follow for case management needs none present at this time.  Expected Discharge Date:  (UNKNOWN)               Expected Discharge Plan:  Skilled Nursing Facility  In-House Referral:  Clinical Social Work  Discharge planning Services  CM Consult  Post Acute Care Choice:    Choice offered to:     DME Arranged:    DME Agency:     HH Arranged:    [place] Agency:     Status of Service:  In process, will continue to follow  If discussed at Long Length of Stay Meetings, dates discussed:    Additional Comments:  Leeroy Cha, RN 08/25/2018, 10:51 AM

## 2018-08-25 NOTE — Progress Notes (Signed)
PROGRESS NOTE  [name] ACZ:[id number] DOB: 1925/10/16 DOA: 08/23/2018 PCP: Crist Infante, MD  HPI/Brief Narrative [name] is a 84 y.o. year old female with medical history significant for dementia [facility] resident), hx of breast CA, GERD, HTN, TIA who presented on 08/23/2018 with fever and generalized malaise and was found to have severe sepsis of unclear etiology.  History limited by patient with dementia and acute confusion in setting of likely infection.  Per chart review patient was previously treated with doxycycline for reported lower leg cellulitis a week prior to presentation.  Per staffing at SNF patient had fever for unknown period of time.  ED course: T-max one 1.3, tachypnea to 22, initial blood pressure 150/70.  Lab work significant for lactic acid of 2.95, WBC 14.4, creatinine 0.89,Potassium 2.7, UA negative for signs of infection. Chest x-ray with no active disease but bilateral hilar fullness/related to pulmonary arterial hypertension. In the ED patient received 1 L LR bolus for lactic acidosis and empirically started on ceftriaxone and azithromycin after obtaining blood cultures x2.  Hospital course: Patient was initially admitted to floor but found to have low blood pressure with SBP's in the 90s as well as worsening lactic acidosis.  Patient was broadened out to vancomycin and cefepime and transferred to stepdown unit on 11/17. Of note, on admission blood pressure was initially 140s over 90s.  Later in the morning blood pressure found to be 200s over 100s and patient received enalaprilat IV x1.  Within 2 hours blood pressure dropped to 90s over 50s requiring fluid boluses in the setting of worsening lactic acidosis.  Subjective No acute complaints.  Eating breakfast.  Does complain of some right ankle swelling/pain.  Assessment/Plan:  Severe sepsis, Improving.  Lactic acid resolved with IV fluids, patient now afebrile, Still with slight  leukocytosis.  Unclear etiology as chest x-ray with no acute findings and patient with no respiratory symptoms.  UA unremarkable and urine and blood culture still pending.  Reports of concern for right lower extremity cellulitis however not very impressive on my exam but given patient was previously treated with doxycycline and severity of symptoms must rule out MRSA before de-escalating antibiotic coverage. Right ankle with erythema and swelling. XR rules out fracture but mentions soft tissue swelling, unsure how much this contributes -continue broad vancomycin and cefepime given severe systemic symptoms and unclear etiology.   -Continue time IV fluids and repeat lactic acid.  Hypotension, resolved. Initially patient presented with somewhat normal blood pressure which quickly escalated to concern for hypertensive emergency with SBP of 109 and diastolic of 1 teens.  Patient received enalaprilat IV x1 on 11/17 and subsequently developed blood pressure with SBP's in the 90s on admission.  Resolved with time maintenance IV fluids and fluid bolus resuscitation.  Hypertension, not at goal.  After holding blood pressure medications due to hypotension patient's blood pressure range last 24 hours 138/59-170/52. -Will resume home amlodipine, IV hydralazine PRN. -Continue to monitor.  Judicious control to prevent another hypotensive episode as mentioned above  Acute metabolic/toxic encephalopathy. Improving. Alert and oriented to person, place, time, context.  Likely delirium in the setting of severe sepsis in patient with known dementia.  Otherwise no acute neurologic findings. -Delirium precautions - Continue Zyprexa daily  Hypokalemia, chronic.  Takes oral potassium at home in the setting of Lasix use.  Currently holding Lasix given previous hypotensive episodes during admission.    Continue to replete orally, magnesium within normal limits. -Give oral potassium 60 mEq  and repeat BMP in a.m. and  supplement as needed -Continue to monitor on telemetry  Systolic ejection murmur.  Very impressive on cardiac exam.  Possible aortic stenosis versus tricuspid regurg in the setting of pulmonary hypertension as evident by chest x-ray.  No active need for echo currently will pursue if patient bacteremic or persists to have fever that can suggest possible endocarditis -Continue to monitor  Code Status: DNR, on admission  Family Communication: Will discuss with son, Shanon Brow per patient request  Disposition Plan: Transfer from stepdown unit to MedSurg floor given hemodynamic stability and resolution of sepsis physiology,  continue empiric IV Vanco and cefepime while monitoring cultures   Consultants:  None     Procedures:  None  Antimicrobials: Anti-infectives (From admission, onward)   Start     Dose/Rate Route Frequency Ordered Stop   08/25/18 0600  vancomycin (VANCOCIN) IVPB 750 mg/150 ml premix     750 mg 150 mL/hr over 60 Minutes Intravenous Every 24 hours 08/24/18 0248     08/24/18 1100  ceFEPIme (MAXIPIME) 1 g in sodium chloride 0.9 % 100 mL IVPB  Status:  Discontinued     1 g 200 mL/hr over 30 Minutes Intravenous Every 12 hours 08/24/18 0012 08/24/18 0138   08/24/18 0600  ceFEPIme (MAXIPIME) 1 g in sodium chloride 0.9 % 100 mL IVPB     1 g 200 mL/hr over 30 Minutes Intravenous Every 12 hours 08/24/18 0138     08/24/18 0030  vancomycin (VANCOCIN) 1,500 mg in sodium chloride 0.9 % 500 mL IVPB     1,500 mg 250 mL/hr over 120 Minutes Intravenous  Once 08/24/18 0020 08/24/18 0414   08/23/18 2315  azithromycin (ZITHROMAX) 500 mg in sodium chloride 0.9 % 250 mL IVPB     500 mg 250 mL/hr over 60 Minutes Intravenous  Once 08/23/18 2301 08/24/18 0040   08/23/18 2145  cefTRIAXone (ROCEPHIN) 1 g in sodium chloride 0.9 % 100 mL IVPB     1 g 200 mL/hr over 30 Minutes Intravenous  Once 08/23/18 2140 08/23/18 2303         Cultures:  Blood cultures x2, 08/23/2018  Telemetry:  No  DVT prophylaxis: Lovenox   Objective: Vitals:   08/25/18 0807 08/25/18 0915 08/25/18 1130 08/25/18 1200  BP: (!) 170/67 (!) 158/90  (!) 153/65  Pulse: 69 73  68  Resp: 20 (!) 3  18  Temp:   99 F (37.2 C)   TempSrc:   Oral   SpO2: 97% 95%  96%  Weight:      Height:        Intake/Output Summary (Last 24 hours) at 08/25/2018 1453 Last data filed at 08/25/2018 1130 Gross per 24 hour  Intake 2147.22 ml  Output 2900 ml  Net -752.78 ml   Filed Weights   08/23/18 2139 08/24/18 0243  Weight: 70.3 kg 68.3 kg    Exam:  Constitutional:elderly female, no distress Eyes: EOMI, anicteric, normal conjunctivae Cardiovascular:systolic murmur heard easily, with no peripheral edema Respiratory: Normal respiratory effort on room air, clear breath sounds  Abdomen: Soft,non-tender, with no HSM Skin: Small abrasion on right lower leg with no surrounding erythema or drainage. Increased swelling and redness near right ankle, slightly tender to touch Neurologic: Grossly no focal neuro deficit. Psychiatric:Appropriate affect, and mood. Mental status AAOx3  Data Reviewed: CBC: Recent Labs  Lab 08/23/18 2138 08/24/18 0031 08/24/18 0525 08/25/18 0240  WBC 14.4* 14.4* 13.6* 12.3*  NEUTROABS 11.3*  --  10.7* 7.6  HGB 11.1* 10.4* 11.0* 10.2*  HCT 33.5* 31.6* 33.8* 31.3*  MCV 88.6 89.8 90.4 90.2  PLT 187 184 177 751   Basic Metabolic Panel: Recent Labs  Lab 08/23/18 2138 08/24/18 0031 08/24/18 0525 08/25/18 0240  NA 136  --  139 138  K 2.7*  --  2.9* 3.0*  CL 105  --  110 111  CO2 21*  --  17* 19*  GLUCOSE 131*  --  111* 88  BUN 20  --  12 12  CREATININE 0.89 0.89 0.68 0.57  CALCIUM 8.5*  --  7.8* 7.8*  MG  --  1.7  --  2.0   GFR: Estimated Creatinine Clearance: 42.1 mL/min (by C-G formula based on SCr of 0.57 mg/dL). Liver Function Tests: Recent Labs  Lab 08/23/18 2138  AST 21  ALT 13  ALKPHOS 40  BILITOT 0.5  PROT 6.0*  ALBUMIN 2.9*   No results for  input(s): LIPASE, AMYLASE in the last 168 hours. No results for input(s): AMMONIA in the last 168 hours. Coagulation Profile: No results for input(s): INR, PROTIME in the last 168 hours. Cardiac Enzymes: No results for input(s): CKTOTAL, CKMB, CKMBINDEX, TROPONINI in the last 168 hours. BNP (last 3 results) No results for input(s): PROBNP in the last 8760 hours. HbA1C: No results for input(s): HGBA1C in the last 72 hours. CBG: No results for input(s): GLUCAP in the last 168 hours. Lipid Profile: No results for input(s): CHOL, HDL, LDLCALC, TRIG, CHOLHDL, LDLDIRECT in the last 72 hours. Thyroid Function Tests: No results for input(s): TSH, T4TOTAL, FREET4, T3FREE, THYROIDAB in the last 72 hours. Anemia Panel: No results for input(s): VITAMINB12, FOLATE, FERRITIN, TIBC, IRON, RETICCTPCT in the last 72 hours. Urine analysis:    Component Value Date/Time   COLORURINE STRAW (A) 08/23/2018 2138   APPEARANCEUR CLEAR 08/23/2018 2138   LABSPEC 1.008 08/23/2018 2138   PHURINE 7.0 08/23/2018 2138   GLUCOSEU NEGATIVE 08/23/2018 2138   HGBUR NEGATIVE 08/23/2018 2138   BILIRUBINUR NEGATIVE 08/23/2018 2138   KETONESUR NEGATIVE 08/23/2018 2138   PROTEINUR NEGATIVE 08/23/2018 2138   UROBILINOGEN 0.2 07/03/2011 0817   NITRITE NEGATIVE 08/23/2018 2138   LEUKOCYTESUR NEGATIVE 08/23/2018 2138   Sepsis Labs: @LABRCNTIP (procalcitonin:4,lacticidven:4)  ) Recent Results (from the past 240 hour(s))  Urine culture     Status: None   Collection Time: 08/23/18  9:39 PM  Result Value Ref Range Status   Specimen Description   Final    URINE, CLEAN CATCH Performed at [facility], [place] [address] [place], [place] [address]    Special Requests   Final    NONE Performed at [facility], [place] [address], [place], [place] [address]    Culture   Final    NO GROWTH Performed at [facility], [place] [address], [place], [place] [address]    Report Status  08/25/2018 FINAL  Final  Blood Culture (routine x 2)     Status: None (Preliminary result)   Collection Time: 08/23/18  9:43 PM  Result Value Ref Range Status   Specimen Description   Final    BLOOD RIGHT HAND Performed at [place] [address], [place], [place] [address]    Special Requests   Final    BOTTLES DRAWN AEROBIC AND ANAEROBIC Blood Culture results may not be optimal due to an excessive volume of blood received in culture bottles Performed at [place] [address], [place], [place] [address]  Culture   Final    NO GROWTH 1 DAY Performed at [facility], [place] [address], [place], [place] [address]    Report Status PENDING  Incomplete  Blood Culture (routine x 2)     Status: None (Preliminary result)   Collection Time: 08/23/18  9:56 PM  Result Value Ref Range Status   Specimen Description BLOOD RIGHT FOREARM  Final   Special Requests   Final    BOTTLES DRAWN AEROBIC AND ANAEROBIC Blood Culture adequate volume   Culture   Final    NO GROWTH 1 DAY Performed at [facility], [place] [address] [place], [place] [address]    Report Status PENDING  Incomplete  MRSA PCR Screening     Status: None   Collection Time: 08/24/18  2:45 AM  Result Value Ref Range Status   MRSA by PCR NEGATIVE NEGATIVE Final    Comment:        The GeneXpert MRSA Assay (FDA approved for NASAL specimens only), is one component of a comprehensive MRSA colonization surveillance program. It is not intended to diagnose MRSA infection nor to guide or monitor treatment for MRSA infections. Performed at [facility], [place] [address], [place],  [address]       Studies: Dg Ankle 2 Views Right  Result Date: 08/25/2018 CLINICAL DATA:  Dementia, sepsis, lower leg cellulitis, RIGHT ankle pain EXAM: RIGHT ANKLE - 2 VIEW COMPARISON:  None FINDINGS: Soft tissue swelling RIGHT ankle. Significant artifacts from clothing.  Joint spaces preserved. Bones demineralized. No acute fracture, dislocation, or bone destruction. Plantar and Achilles insertion calcaneal spur formation. IMPRESSION: Soft tissue swelling and calcaneal spur formation without acute bony abnormalities. Electronically Signed   By: Lavonia Dana M.D.   On: 08/25/2018 10:56    Scheduled Meds: . amLODipine  10 mg Oral Daily  . divalproex  250 mg Oral BID  . enoxaparin (LOVENOX) injection  30 mg Subcutaneous Q24H  . OLANZapine  5 mg Oral QHS  . traZODone  50 mg Oral QHS    Continuous Infusions: . sodium chloride 10 mL/hr at 08/24/18 0415  . sodium chloride 75 mL/hr at 08/25/18 0800  . ceFEPime (MAXIPIME) IV Stopped (08/25/18 1009)  . vancomycin Stopped (08/25/18 0730)     LOS: 1 day     Desiree Hane, MD [facility] Pager [phone]  If 7PM-7AM, please contact night-coverage [email or web address] Password [email or web address] 08/25/2018, 2:53 PM

## 2018-08-26 DIAGNOSIS — I159 Secondary hypertension, unspecified: Secondary | ICD-10-CM

## 2018-08-26 DIAGNOSIS — L03115 Cellulitis of right lower limb: Secondary | ICD-10-CM

## 2018-08-26 DIAGNOSIS — E876 Hypokalemia: Secondary | ICD-10-CM

## 2018-08-26 DIAGNOSIS — E872 Acidosis: Secondary | ICD-10-CM

## 2018-08-26 LAB — CBC WITH DIFFERENTIAL/PLATELET
ABS IMMATURE GRANULOCYTES: 0.06 10*3/uL (ref 0.00–0.07)
BASOS PCT: 1 %
Basophils Absolute: 0 10*3/uL (ref 0.0–0.1)
EOS ABS: 0.4 10*3/uL (ref 0.0–0.5)
Eosinophils Relative: 5 %
HCT: 32.1 % — ABNORMAL LOW (ref 36.0–46.0)
Hemoglobin: 10.6 g/dL — ABNORMAL LOW (ref 12.0–15.0)
Immature Granulocytes: 1 %
LYMPHS ABS: 1.8 10*3/uL (ref 0.7–4.0)
Lymphocytes Relative: 24 %
MCH: 28.9 pg (ref 26.0–34.0)
MCHC: 33 g/dL (ref 30.0–36.0)
MCV: 87.5 fL (ref 80.0–100.0)
MONOS PCT: 13 %
Monocytes Absolute: 1 10*3/uL (ref 0.1–1.0)
NEUTROS PCT: 56 %
NRBC: 0 % (ref 0.0–0.2)
Neutro Abs: 4.3 10*3/uL (ref 1.7–7.7)
PLATELETS: 225 10*3/uL (ref 150–400)
RBC: 3.67 MIL/uL — ABNORMAL LOW (ref 3.87–5.11)
RDW: 14.2 % (ref 11.5–15.5)
WBC: 7.5 10*3/uL (ref 4.0–10.5)

## 2018-08-26 LAB — BASIC METABOLIC PANEL
ANION GAP: 8 (ref 5–15)
BUN: 8 mg/dL (ref 8–23)
CO2: 21 mmol/L — AB (ref 22–32)
Calcium: 8.2 mg/dL — ABNORMAL LOW (ref 8.9–10.3)
Chloride: 108 mmol/L (ref 98–111)
Creatinine, Ser: 0.58 mg/dL (ref 0.44–1.00)
GFR calc Af Amer: >60 mL/min (ref >60)
GLUCOSE: 98 mg/dL (ref 70–99)
POTASSIUM: 2.8 mmol/L — AB (ref 3.5–5.1)
Sodium: 137 mmol/L (ref 135–145)

## 2018-08-26 LAB — MAGNESIUM: Magnesium: 1.9 mg/dL (ref 1.7–2.4)

## 2018-08-26 MED ORDER — POTASSIUM CHLORIDE CRYS ER 20 MEQ PO TBCR
40.0000 meq | EXTENDED_RELEASE_TABLET | Freq: Two times a day (BID) | ORAL | Status: AC
  Administered 2018-08-26 (×2): 40 meq via ORAL
  Filled 2018-08-26 (×2): qty 2

## 2018-08-26 MED ORDER — POTASSIUM CHLORIDE 10 MEQ/100ML IV SOLN
10.0000 meq | INTRAVENOUS | Status: AC
  Administered 2018-08-26 (×4): 10 meq via INTRAVENOUS
  Filled 2018-08-26 (×4): qty 100

## 2018-08-26 NOTE — Progress Notes (Signed)
PROGRESS NOTE    [name]  KYH:[id number] DOB: [birth year] DOA: 08/23/2018 PCP: Crist Infante, MD   Brief Narrative: Gabrielle Paddock [name] a 84 y.o.year old femalewith medical history significant for dementia [facility] resident), hx of breast CA, GERD, HTN, TIAwho presented on 11/17/2019withfever and generalized malaiseand was found to havesevere sepsis of unclear etiology.  History limited by patient with dementia and acute confusion in setting of likely infection. Per chart review patient was previously treated with doxycycline for reported lower leg cellulitis a week prior to presentation. Per staffing at SNF patient had fever for unknown period of time.  ED course: T-max one 1.3, tachypnea to 22, initial blood pressure 150/70. Lab work significant for lactic acid of 2.95, WBC 14.4, creatinine 0.89,Potassium 2.7, UA negative for signs of infection. Chest x-ray with no active disease but bilateral hilar fullness/related to pulmonary arterial hypertension. In the ED patient received 1 L LR bolus for lactic acidosis and empirically started on ceftriaxone and azithromycin after obtaining blood cultures x2.  Hospital course:Patient was initially admitted to floor but found to have low blood pressure with SBP's in the 90s as well as worsening lactic acidosis. Patient was broadened out to vancomycin and cefepime and transferred to stepdown unit on 11/17. Of note, on admission blood pressure was initially 140s over 90s. Later in the morning blood pressure found to be 200s over 100s and patient received enalaprilat IV x1. Within 2 hours blood pressure dropped to 90s over 50s requiring fluid boluses in the setting of worsening lactic acidosis.  **Sepsis likely secondary to her right lower leg cellulitis even though is not very impressive.  Assessment & Plan:   Active Problems:   Hypertension   Severe sepsis (HCC)   Hypokalemia   Lactic acidosis   Hypotension   Acute  metabolic encephalopathy  Severe Sepsis, Improved  -Lactic acid resolved with IV fluids, patient now afebrile, Still with slight leukocytosis.  -Unclear etiology as chest x-ray with no acute findings and patient with no respiratory symptoms but suspect it was from incomplete treatment of Right LE Cellulitis.  -UA unremarkable and urine and blood culture still pending.  -Concern for right lower extremity cellulitis however not very impressive on my exam but given patient was previously treated with doxycycline and severity of symptoms must rule out MRSA before de-escalating antibiotic coverage.  -Right ankle with erythema and swelling. XR rules out fracture but mentions soft tissue swelling, unsure how much this contributes -Continue broad vancomycin and cefepime given severe systemic symptoms and unclear etiology.  -Continue w/ IVF Maintenance with NS at 75 mL/hr today and likely D/C in AM.  -LA trended down from 4.26 and repeat lactic acid was 1.4 -WBC is trended down is normal from 12.3 and now 7.5  Hypotension, resolved. -Initially patient presented with somewhat normal blood pressure which quickly escalated to concern for hypertensive emergency with SBP of 151 and diastolic of 1 teens. Patient received enalaprilat IV x1 on 11/17 and subsequently developed blood pressure with SBP's in the 90s on admission.   -Resolved with Maintenance IV fluids and fluid bolus resuscitation. -BP is now 160/77  Hypertension, not at goal.   -After holding blood pressure medications due to hypotension patient's blood pressure was now 160/77 this Afternoon -Resumed home Amlodipine and will c/w IV hydralazine PRN. -Has Atenolol and Metoprolol on MAR; Will follow up to see which one she was taking  -Continue to monitor.  Judicious control to prevent another hypotensive episode as mentioned above  Acute metabolic/toxic encephalopathy. Improving.  -Alert and oriented to person, place, time, context.   -Likely delirium in the setting of severe sepsis in patient with known dementia.  -Otherwise no acute neurologic findings. -C/w Delirium precautions -Continue Zyprexa 5 mg qHS  Hypokalemia, chronic -Potassium this AM was 2.8 -Takes oral potassium at home in the setting of Lasix use. Currently holding Lasix given previous hypotensive episodes during admission.    -Pleat with p.o. potassium chloride 40 mg twice daily x2 doses and IV potassium chloride 40 mEq x 1 -Continue monitor and replete as necessary -Repeat CMP in a.m.  Systolic Ejection Murmur.  -Possible aortic stenosis versus tricuspid regurg in the setting of pulmonary hypertension as evident by chest x-ray.  -Patient states she has a known murmur but no ECHO on filue -No active need for echo currently will pursue if patient bacteremic or persists to have fever that can suggest possible endocarditis -Continue to monitor and will need outpatient Cardiac Workup but would not recommend definitive treatment given goals of care   Normocytic Anemia -Patient's hemoglobin/hematocrit is stable at 10.6/32.1 -Continue to monitor signs and symptoms of bleeding -Check anemia panel in a.m. -Repeat CBC in the a.m.  DVT prophylaxis: Enoxaparin 40 mg sq Daily Code Status: DO NOT RESUSCITATE Family Communication: No family present at bedside Disposition Plan: Remain Inpatient for current treatment  Consultants:   None  Procedures:   None  Antimicrobials:  Anti-infectives (From admission, onward)   Start     Dose/Rate Route Frequency Ordered Stop   08/25/18 0600  vancomycin (VANCOCIN) IVPB 750 mg/150 ml premix     750 mg 150 mL/hr over 60 Minutes Intravenous Every 24 hours 08/24/18 0248     08/24/18 1100  ceFEPIme (MAXIPIME) 1 g in sodium chloride 0.9 % 100 mL IVPB  Status:  Discontinued     1 g 200 mL/hr over 30 Minutes Intravenous Every 12 hours 08/24/18 0012 08/24/18 0138   08/24/18 0600  ceFEPIme (MAXIPIME) 1 g in sodium  chloride 0.9 % 100 mL IVPB     1 g 200 mL/hr over 30 Minutes Intravenous Every 12 hours 08/24/18 0138     08/24/18 0030  vancomycin (VANCOCIN) 1,500 mg in sodium chloride 0.9 % 500 mL IVPB     1,500 mg 250 mL/hr over 120 Minutes Intravenous  Once 08/24/18 0020 08/24/18 0414   08/23/18 2315  azithromycin (ZITHROMAX) 500 mg in sodium chloride 0.9 % 250 mL IVPB     500 mg 250 mL/hr over 60 Minutes Intravenous  Once 08/23/18 2301 08/24/18 0040   08/23/18 2145  cefTRIAXone (ROCEPHIN) 1 g in sodium chloride 0.9 % 100 mL IVPB     1 g 200 mL/hr over 30 Minutes Intravenous  Once 08/23/18 2140 08/23/18 2303     Subjective: Seen and examined at bedside and states that her leg was hurting.  Denies any chest pain, lightheadedness or dizziness.  No nausea or vomiting.  No other concerns or complaints at this time  Objective: Vitals:   08/25/18 1818 08/25/18 2105 08/26/18 0419 08/26/18 1447  BP: (!) 150/73 (!) 163/70 (!) 162/82 (!) 160/77  Pulse: 81 68 78 70  Resp: 18 18 20 20   Temp: 98.8 F (37.1 C) 98.7 F (37.1 C) 98.3 F (36.8 C) 99.6 F (37.6 C)  TempSrc: Oral Oral  Oral  SpO2: 98% 98% 95% 97%  Weight:      Height:        Intake/Output Summary (Last 24 hours) at 08/26/2018  [place] filed at 08/26/2018 0500 Gross per 24 hour  Intake 880.64 ml  Output 1700 ml  Net -819.36 ml   Filed Weights   08/23/18 2139 08/24/18 0243  Weight: 70.3 kg 68.3 kg   Examination: Physical Exam:  Constitutional: WN/WD overweight Caucasian female in NAD and appears calm and comfortable Eyes: Lids and conjunctivae normal, sclerae anicteric  ENMT: External Ears, Nose appear normal. Slightly hard of hearing Neck: Appears normal, supple, no cervical masses, normal ROM, no appreciable thyromegaly; no JVD Respiratory: Diminished to auscultation bilaterally, no wheezing, rales, rhonchi or crackles.  Cardiovascular: RRR, Has a loud systolic murmur No extremity edema. 2+ pedal pulses. No carotid  bruits.  Abdomen: Soft, non-tender, non-distended. No masses palpated. No appreciable hepatosplenomegaly. Bowel sounds positive x4.  GU: Deferred. Musculoskeletal: No clubbing / cyanosis of digits/nails. Normal strength and muscle tone.  Skin: Has skin changes with erythema and warmth on right medial ankle. No induration; Warm and dry.  Neurologic: CN 2-12 grossly intact with no focal deficits.. Romberg sign and cerebellar reflexes not assessed.  Psychiatric: Normal judgment and insight. Alert and awake. Pleasant mood and appropriate affect.   Data Reviewed: I have personally reviewed following labs and imaging studies  CBC: Recent Labs  Lab 08/23/18 2138 08/24/18 0031 08/24/18 0525 08/25/18 0240 08/26/18 0622  WBC 14.4* 14.4* 13.6* 12.3* 7.5  NEUTROABS 11.3*  --  10.7* 7.6 4.3  HGB 11.1* 10.4* 11.0* 10.2* 10.6*  HCT 33.5* 31.6* 33.8* 31.3* 32.1*  MCV 88.6 89.8 90.4 90.2 87.5  PLT 187 184 177 169 371   Basic Metabolic Panel: Recent Labs  Lab 08/23/18 2138 08/24/18 0031 08/24/18 0525 08/25/18 0240 08/26/18 0622  NA 136  --  139 138 137  K 2.7*  --  2.9* 3.0* 2.8*  CL 105  --  110 111 108  CO2 21*  --  17* 19* 21*  GLUCOSE 131*  --  111* 88 98  BUN 20  --  12 12 8   CREATININE 0.89 0.89 0.68 0.57 0.58  CALCIUM 8.5*  --  7.8* 7.8* 8.2*  MG  --  1.7  --  2.0 1.9   GFR: Estimated Creatinine Clearance: 42.1 mL/min (by C-G formula based on SCr of 0.58 mg/dL). Liver Function Tests: Recent Labs  Lab 08/23/18 2138  AST 21  ALT 13  ALKPHOS 40  BILITOT 0.5  PROT 6.0*  ALBUMIN 2.9*   No results for input(s): LIPASE, AMYLASE in the last 168 hours. No results for input(s): AMMONIA in the last 168 hours. Coagulation Profile: No results for input(s): INR, PROTIME in the last 168 hours. Cardiac Enzymes: No results for input(s): CKTOTAL, CKMB, CKMBINDEX, TROPONINI in the last 168 hours. BNP (last 3 results) No results for input(s): PROBNP in the last 8760 hours. HbA1C: No  results for input(s): HGBA1C in the last 72 hours. CBG: No results for input(s): GLUCAP in the last 168 hours. Lipid Profile: No results for input(s): CHOL, HDL, LDLCALC, TRIG, CHOLHDL, LDLDIRECT in the last 72 hours. Thyroid Function Tests: No results for input(s): TSH, T4TOTAL, FREET4, T3FREE, THYROIDAB in the last 72 hours. Anemia Panel: No results for input(s): VITAMINB12, FOLATE, FERRITIN, TIBC, IRON, RETICCTPCT in the last 72 hours. Sepsis Labs: Recent Labs  Lab 08/23/18 2243 08/24/18 0044 08/24/18 0525 08/24/18 1611  LATICACIDVEN 2.95* 4.26* 2.9* 1.4    Recent Results (from the past 240 hour(s))  Urine culture     Status: None   Collection Time: 08/23/18  9:39 PM  Result Value Ref Range Status   Specimen Description   Final    URINE, CLEAN CATCH Performed at [facility], [place] [address], [place], [place] [address]    Special Requests   Final    NONE Performed at [facility], [place] [address] [place], [place] [address]    Culture   Final    NO GROWTH Performed at [facility], [place] [address], [place], [place] [address]    Report Status 08/25/2018 FINAL  Final  Blood Culture (routine x 2)     Status: None (Preliminary result)   Collection Time: 08/23/18  9:43 PM  Result Value Ref Range Status   Specimen Description   Final    BLOOD RIGHT HAND Performed at [facility] [place] [address], [place], [place] [address]    Special Requests   Final    BOTTLES DRAWN AEROBIC AND ANAEROBIC Blood Culture results may not be optimal due to an excessive volume of blood received in culture bottles Performed at [place] [address], [place], [place] [address]    Culture   Final    NO GROWTH 2 DAYS Performed at [facility] [address] [place], [place] [address]    Report Status PENDING  Incomplete  Blood Culture (routine x 2)     Status: None (Preliminary result)   Collection  Time: 08/23/18  9:56 PM  Result Value Ref Range Status   Specimen Description BLOOD RIGHT FOREARM  Final   Special Requests   Final    BOTTLES DRAWN AEROBIC AND ANAEROBIC Blood Culture adequate volume   Culture   Final    NO GROWTH 2 DAYS Performed at [facility], [place] [address], [place], [place] [address]    Report Status PENDING  Incomplete  MRSA PCR Screening     Status: None   Collection Time: 08/24/18  2:45 AM  Result Value Ref Range Status   MRSA by PCR NEGATIVE NEGATIVE Final    Comment:        The GeneXpert MRSA Assay (FDA approved for NASAL specimens only), is one component of a comprehensive MRSA colonization surveillance program. It is not intended to diagnose MRSA infection nor to guide or monitor treatment for MRSA infections. Performed at [facility], [place] [address] [place], [place] [address]     Radiology Studies: Dg Ankle 2 Views Right  Result Date: 08/25/2018 CLINICAL DATA:  Dementia, sepsis, lower leg cellulitis, RIGHT ankle pain EXAM: RIGHT ANKLE - 2 VIEW COMPARISON:  None FINDINGS: Soft tissue swelling RIGHT ankle. Significant artifacts from clothing. Joint spaces preserved. Bones demineralized. No acute fracture, dislocation, or bone destruction. Plantar and Achilles insertion calcaneal spur formation. IMPRESSION: Soft tissue swelling and calcaneal spur formation without acute bony abnormalities. Electronically Signed   By: Lavonia Dana M.D.   On: 08/25/2018 10:56   Scheduled Meds: . amLODipine  10 mg Oral Daily  . divalproex  250 mg Oral BID  . enoxaparin (LOVENOX) injection  40 mg Subcutaneous Daily  . OLANZapine  5 mg Oral QHS  . potassium chloride  40 mEq Oral BID  . traZODone  50 mg Oral QHS   Continuous Infusions: . sodium chloride 10 mL/hr at 08/24/18 0415  . sodium chloride 75 mL/hr at 08/26/18 1607  . ceFEPime (MAXIPIME) IV 1 g (08/26/18 1216)  . vancomycin 750 mg (08/26/18 0600)    LOS: 2 days  Kerney Elbe, DO [facility] PAGER is on AMION  If 7PM-7AM, please contact night-coverage [email or web address] Password [facility] 08/26/2018, 6:03 PM

## 2018-08-27 LAB — CBC WITH DIFFERENTIAL/PLATELET
Abs Immature Granulocytes: 0.09 10*3/uL — ABNORMAL HIGH (ref 0.00–0.07)
BASOS ABS: 0 10*3/uL (ref 0.0–0.1)
BASOS PCT: 1 %
EOS ABS: 0.4 10*3/uL (ref 0.0–0.5)
Eosinophils Relative: 6 %
HCT: 32.2 % — ABNORMAL LOW (ref 36.0–46.0)
Hemoglobin: 10.6 g/dL — ABNORMAL LOW (ref 12.0–15.0)
IMMATURE GRANULOCYTES: 1 %
LYMPHS ABS: 1.8 10*3/uL (ref 0.7–4.0)
Lymphocytes Relative: 27 %
MCH: 29.2 pg (ref 26.0–34.0)
MCHC: 32.9 g/dL (ref 30.0–36.0)
MCV: 88.7 fL (ref 80.0–100.0)
Monocytes Absolute: 0.9 10*3/uL (ref 0.1–1.0)
Monocytes Relative: 13 %
NEUTROS PCT: 52 %
NRBC: 0 % (ref 0.0–0.2)
Neutro Abs: 3.6 10*3/uL (ref 1.7–7.7)
PLATELETS: 226 10*3/uL (ref 150–400)
RBC: 3.63 MIL/uL — AB (ref 3.87–5.11)
RDW: 14.1 % (ref 11.5–15.5)
WBC: 6.8 10*3/uL (ref 4.0–10.5)

## 2018-08-27 LAB — COMPREHENSIVE METABOLIC PANEL
ALK PHOS: 38 U/L (ref 38–126)
ALT: 14 U/L (ref 0–44)
AST: 17 U/L (ref 15–41)
Albumin: 2.6 g/dL — ABNORMAL LOW (ref 3.5–5.0)
Anion gap: 9 (ref 5–15)
BILIRUBIN TOTAL: 0.7 mg/dL (ref 0.3–1.2)
BUN: 8 mg/dL (ref 8–23)
CALCIUM: 8.5 mg/dL — AB (ref 8.9–10.3)
CO2: 21 mmol/L — ABNORMAL LOW (ref 22–32)
Chloride: 109 mmol/L (ref 98–111)
Creatinine, Ser: 0.54 mg/dL (ref 0.44–1.00)
GFR calc Af Amer: >60 mL/min (ref >60)
GLUCOSE: 92 mg/dL (ref 70–99)
Potassium: 3.3 mmol/L — ABNORMAL LOW (ref 3.5–5.1)
Sodium: 139 mmol/L (ref 135–145)
TOTAL PROTEIN: 5.8 g/dL — AB (ref 6.5–8.1)

## 2018-08-27 LAB — PHOSPHORUS: PHOSPHORUS: 2.3 mg/dL — AB (ref 2.5–4.6)

## 2018-08-27 LAB — MAGNESIUM: MAGNESIUM: 2 mg/dL (ref 1.7–2.4)

## 2018-08-27 MED ORDER — AMOXICILLIN-POT CLAVULANATE 875-125 MG PO TABS
1.0000 | ORAL_TABLET | Freq: Two times a day (BID) | ORAL | Status: DC
  Administered 2018-08-27 – 2018-08-31 (×8): 1 via ORAL
  Filled 2018-08-27 (×8): qty 1

## 2018-08-27 MED ORDER — POTASSIUM CHLORIDE CRYS ER 20 MEQ PO TBCR
40.0000 meq | EXTENDED_RELEASE_TABLET | Freq: Two times a day (BID) | ORAL | Status: AC
  Administered 2018-08-27 (×2): 40 meq via ORAL
  Filled 2018-08-27 (×2): qty 2

## 2018-08-27 MED ORDER — DOXYCYCLINE HYCLATE 100 MG PO TABS
100.0000 mg | ORAL_TABLET | Freq: Two times a day (BID) | ORAL | Status: DC
  Administered 2018-08-27 – 2018-08-31 (×8): 100 mg via ORAL
  Filled 2018-08-27 (×8): qty 1

## 2018-08-27 MED ORDER — METOPROLOL TARTRATE 25 MG PO TABS
25.0000 mg | ORAL_TABLET | Freq: Two times a day (BID) | ORAL | Status: DC
  Administered 2018-08-27 – 2018-08-31 (×8): 25 mg via ORAL
  Filled 2018-08-27 (×8): qty 1

## 2018-08-27 MED ORDER — K PHOS MONO-SOD PHOS DI & MONO 155-852-130 MG PO TABS
500.0000 mg | ORAL_TABLET | Freq: Two times a day (BID) | ORAL | Status: AC
  Administered 2018-08-27 (×2): 500 mg via ORAL
  Filled 2018-08-27 (×2): qty 2

## 2018-08-27 NOTE — [place] FL2 (Signed)
[place] MEDICAID FL2 LEVEL OF CARE SCREENING TOOL     IDENTIFICATION  Patient Name: [name] Birthdate: 06-25-1926 Sex: female Admission Date (Current Location): 08/23/2018  [facility] and [place] Number:  Herbalist and Address:  [facility],  [place], [place]      Provider Number: [id number]  Attending Physician Name and Address:  Kerney Elbe, DO  Relative Name and Phone Number:  Saffron Busey: [phone]    Current Level of Care: Hospital Recommended Level of Care: [place] Prior Approval Number:    Date Approved/Denied:   PASRR Number: [id number]  Discharge Plan: SNF    Current Diagnoses: Patient Active Problem List   Diagnosis Date Noted  . Severe sepsis ([place]) 08/24/2018  . Hypokalemia 08/24/2018  . Lactic acidosis 08/24/2018  . Hypotension 08/24/2018  . Acute metabolic encephalopathy 70/35/0093  . Aggressive behavior of adult 07/04/2018  . Adverse drug reaction 12/25/2017  . Closed pelvic fracture ([place]) 12/18/2017  . Closed fracture of multiple pubic rami ([place]) 12/18/2017  . CKD (chronic kidney disease) stage 3, GFR 30-59 ml/min (HCC) 12/18/2017  . Internal hemorrhoids with complication with suspected prolapse 02/16/2014  . Cancer of upper-outer quadrant of female breast ([place]) 06/16/2012  . Hemorrhoids, internal, thrombosed, Left lateral 02/10/2012  . CONSTIPATION 04/18/2008  . HYPERLIPIDEMIA 04/15/2008  . Hypertension 04/15/2008  . TRANSIENT ISCHEMIC ATTACK 04/15/2008  . ESOPHAGEAL STRICTURE 04/15/2008  . GERD 04/15/2008  . ARTHRITIS 04/15/2008    Orientation RESPIRATION BLADDER Height & Weight     Self, Place  Normal External catheter Weight: 150 lb 9.2 oz (68.3 kg) Height:  5\' 5"  (165.1 cm)  BEHAVIORAL SYMPTOMS/MOOD NEUROLOGICAL BOWEL NUTRITION STATUS      Continent Diet(Regular)  AMBULATORY STATUS COMMUNICATION OF NEEDS Skin   Limited Assist Verbally Normal                     Personal Care Assistance Level of Assistance  Bathing, Feeding, Dressing Bathing Assistance: Maximum assistance Feeding assistance: Limited assistance Dressing Assistance: Limited assistance     Functional Limitations Info  Sight, Hearing, Speech Sight Info: Adequate Hearing Info: Adequate Speech Info: Adequate    SPECIAL CARE FACTORS FREQUENCY  OT (By licensed OT), PT (By licensed PT)     PT Frequency: 5x/week OT Frequency: 5x/week            Contractures Contractures Info: Not present    Additional Factors Info  Code Status, Allergies, Psychotropic Code Status Info: DNR Allergies Info: HYDROCODONE, IBANDRONATE SODIUM, LATEX, SULFONAMIDE DERIVATIVES, TESSALON BENZONATATE  Psychotropic Info: PRN Ativan         Current Medications (08/27/2018):  This is the current hospital active medication list Current Facility-Administered Medications  Medication Dose Route Frequency Provider Last Rate Last Dose  . 0.9 %  sodium chloride infusion   Intravenous PRN Oretha Milch D, MD 10 mL/hr at 08/24/18 0415    . acetaminophen (TYLENOL) tablet 650 mg  650 mg Oral Q6H PRN Oretha Milch D, MD   650 mg at 08/24/18 0448  . amLODipine (NORVASC) tablet 10 mg  10 mg Oral Daily Oretha Milch D, MD   10 mg at 08/27/18 1114  . amoxicillin-clavulanate (AUGMENTIN) 875-125 MG per tablet 1 tablet  1 tablet Oral Q12H SheikhGeorgina Quint [place], DO   1 tablet at 08/27/18 1401  . divalproex (DEPAKOTE SPRINKLE) capsule 250 mg  250 mg Oral BID Oretha Milch D, MD   250 mg at 08/27/18  1114  . doxycycline (VIBRA-TABS) tablet 100 mg  100 mg Oral Q12H Sheikh, Omair [place], DO   100 mg at 08/27/18 1401  . enoxaparin (LOVENOX) injection 40 mg  40 mg Subcutaneous Daily Shade, Christine E, RPH   40 mg at 08/27/18 1114  . hydrALAZINE (APRESOLINE) injection 5-10 mg  5-10 mg Intravenous Q6H PRN Oretha Milch D, MD      . LORazepam (ATIVAN) tablet 0.5 mg  0.5 mg Oral Q6H PRN Oretha Milch D, MD   0.5 mg  at 08/24/18 0412  . metoprolol tartrate (LOPRESSOR) tablet 25 mg  25 mg Oral BID Sheikh, Omair Latif, DO      . OLANZapine (ZYPREXA) tablet 5 mg  5 mg Oral QHS Oretha Milch D, MD   5 mg at 08/26/18 2214  . phosphorus (K PHOS NEUTRAL) tablet 500 mg  500 mg Oral BID Raiford [place], DO   500 mg at 08/27/18 1114  . polyethylene glycol (MIRALAX / GLYCOLAX) packet 17 g  17 g Oral Daily PRN Oretha Milch D, MD      . potassium chloride SA (K-DUR,KLOR-CON) CR tablet 40 mEq  40 mEq Oral BID Raiford [place] Latif, DO   40 mEq at 08/27/18 1114  . traZODone (DESYREL) tablet 50 mg  50 mg Oral QHS Oretha Milch D, MD   50 mg at 08/26/18 2213     Discharge Medications: Please see discharge summary for a list of discharge medications.  Relevant Imaging Results:  Relevant Lab Results:   Additional Information SSN: [id number]  Pricilla Holm, [place]

## 2018-08-27 NOTE — Care Management Important Message (Signed)
Important Message  Patient Details  Name: [name] MRN: [id number] Date of Birth: [birth year]   Medicare Important Message Given:       Kerin Salen 08/27/2018, 11:06 AMImportant Message  Patient Details  Name: [name] MRN: [id number] Date of Birth: 08-11-1926   Medicare Important Message Given:       Kerin Salen 08/27/2018, 11:06 AM

## 2018-08-27 NOTE — Evaluation (Signed)
Physical Therapy Evaluation Patient Details Name: [name] MRN: [id number] DOB: 10/06/1926 Today's Date: 08/27/2018   History of Present Illness  84 y.o.year old femalewith medical history significant for dementia [name] resident), pelvic fx March 2019, hx of breast CA, GERD, HTN, TIAwho presented on 11/17/2019withfever and generalized malaiseand was found to havesevere sepsis of unclear etiology.  Clinical Impression  Pt admitted with above diagnosis. Pt currently with functional limitations due to the deficits listed below (see PT Problem List). Pt ambulated 30' with RW, with min assist for mild loss of balance x 2. Pt oriented to self (though incorrectly stated year of birth as [birth year]) and location, not to situation/year. She would benefit from 24* supervision due to memory impairment and fall risk.   Pt will benefit from skilled PT to increase their independence and safety with mobility to allow discharge to the venue listed below.       Follow Up Recommendations Supervision/Assistance - 24 hour, due to memory impairment/fall risk;Supervision for mobility/OOB;SNF    Equipment Recommendations  None recommended by PT    Recommendations for Other Services       Precautions / Restrictions Precautions Precautions: Fall Precaution Comments: h/o multiple falls with pelic fx March 0938 Restrictions Weight Bearing Restrictions: No      Mobility  Bed Mobility Overal bed mobility: Modified Independent             General bed mobility comments: HOB up  Transfers Overall transfer level: Needs assistance Equipment used: Rolling walker (2 wheeled) Transfers: Sit to/from Stand Sit to Stand: Min assist            Ambulation/Gait Ambulation/Gait assistance: Min assist;Min guard Gait Distance (Feet): 18 Feet Assistive device: Rolling walker (2 wheeled) Gait Pattern/deviations: Decreased stride length;Step-through pattern Gait velocity: decr   General  Gait Details: distance limited by fatigue, min A for minor loss of balance x 2  Stairs            Wheelchair Mobility    Modified Rankin (Stroke Patients Only)       Balance Overall balance assessment: Needs assistance;History of Falls Sitting-balance support: Feet supported Sitting balance-Leahy Scale: Fair     Standing balance support: Bilateral upper extremity supported Standing balance-Leahy Scale: Poor Standing balance comment: posterior lean initially                             Pertinent Vitals/Pain Pain Assessment: No/denies pain    Home Living Family/patient expects to be discharged to:: Assisted living                      Prior Function Level of [place]: Needs assistance   Gait / Transfers Assistance Needed: walks without AD at facility  ADL's / Homemaking Assistance Needed: assist for ADLs        Hand Dominance        Extremity/Trunk Assessment   Upper Extremity Assessment Upper Extremity Assessment: Generalized weakness    Lower Extremity Assessment Lower Extremity Assessment: Generalized weakness(B knee ext -4/5)    Cervical / Trunk Assessment Cervical / Trunk Assessment: Kyphotic  Communication   Communication: HOH  Cognition Arousal/Alertness: Awake/alert Behavior During Therapy: WFL for tasks assessed/performed Overall Cognitive Status: Impaired/Different from baseline Area of Impairment: Orientation;Memory                 Orientation Level: Situation;Time;Disoriented to  General Comments      Exercises     Assessment/Plan    PT Assessment Patient needs continued PT services  PT Problem List Decreased mobility;Decreased balance;Decreased activity tolerance;Decreased cognition;Decreased strength       PT Treatment Interventions Gait training;Therapeutic exercise;Therapeutic activities;Functional mobility training;Balance training;Patient/family education    PT  Goals (Current goals can be found in the Care Plan section)  Acute Rehab PT Goals Patient Stated Goal: to return to walking independently PT Goal Formulation: With patient/family Time For Goal Achievement: 09/10/18 Potential to Achieve Goals: Good    Frequency Min 3X/week   Barriers to discharge        Co-evaluation               AM-PAC PT "6 Clicks" Daily Activity  Outcome Measure Difficulty turning over in bed (including adjusting bedclothes, sheets and blankets)?: A Little Difficulty moving from lying on back to sitting on the side of the bed? : A Little Difficulty sitting down on and standing up from a chair with arms (e.g., wheelchair, bedside commode, etc,.)?: Unable Help needed moving to and from a bed to chair (including a wheelchair)?: A Little Help needed walking in hospital room?: A Little Help needed climbing 3-5 steps with a railing? : A Lot 6 Click Score: 15    End of Session Equipment Utilized During Treatment: Gait belt Activity Tolerance: Patient limited by fatigue Patient left: in chair;with call bell/phone within reach;with chair alarm set Nurse Communication: Mobility status PT Visit Diagnosis: Difficulty in walking, not elsewhere classified (R26.2);Muscle weakness (generalized) (M62.81);History of falling (Z91.81)    Time: 8127-5170 PT Time Calculation (min) (ACUTE ONLY): 31 min   Charges:   PT Evaluation $PT Eval Moderate Complexity: 1 Mod PT Treatments $Gait Training: 8-22 mins        Blondell Reveal Kistler PT 08/27/2018  Acute Rehabilitation Services Pager [phone] Office [phone]

## 2018-08-27 NOTE — Progress Notes (Signed)
PROGRESS NOTE    [name]  GGY:[id number] DOB: [birth year] DOA: 08/23/2018 PCP: Crist Infante, MD   Brief Narrative: [name] a 84 y.o.year old femalewith medical history significant for dementia [facility] resident), hx of breast CA, GERD, HTN, TIAwho presented on 11/17/2019withfever and generalized malaiseand was found to havesevere sepsis of unclear etiology.  History limited by patient with dementia and acute confusion in setting of likely infection. Per chart review patient was previously treated with doxycycline for reported lower leg cellulitis a week prior to presentation. Per staffing at SNF patient had fever for unknown period of time.  ED course: T-max one 1.3, tachypnea to 22, initial blood pressure 150/70. Lab work significant for lactic acid of 2.95, WBC 14.4, creatinine 0.89,Potassium 2.7, UA negative for signs of infection. Chest x-ray with no active disease but bilateral hilar fullness/related to pulmonary arterial hypertension. In the ED patient received 1 L LR bolus for lactic acidosis and empirically started on ceftriaxone and azithromycin after obtaining blood cultures x2.  Hospital course:Patient was initially admitted to floor but found to have low blood pressure with SBP's in the 90s as well as worsening lactic acidosis. Patient was broadened out to vancomycin and cefepime and transferred to stepdown unit on 11/17. Of note, on admission blood pressure was initially 140s over 90s. Later in the morning blood pressure found to be 200s over 100s and patient received enalaprilat IV x1. Within 2 hours blood pressure dropped to 90s over 50s requiring fluid boluses in the setting of worsening lactic acidosis.  **Sepsis likely secondary to her right lower leg cellulitis even though is not very impressive. Abx to be changed to po today with anticipation of D/C to SNF in the AM.   Assessment & Plan:   Active Problems:   Hypertension   Severe  sepsis (HCC)   Hypokalemia   Lactic acidosis   Hypotension   Acute metabolic encephalopathy  Severe Sepsis, Improved  -Lactic acid resolved with IV fluids, patient now afebrile, Still with slight leukocytosis but now normalized  -Unclear etiology as chest x-ray with no acute findings and patient with no respiratory symptoms but suspect it was from incomplete treatment of Right LE Cellulitis.  -UA unremarkable and urine and blood culture still pending.  -Concern for right lower extremity cellulitis however not very impressive on my exam but given patient was previously treated with doxycycline and severity of symptoms must rule out MRSA before de-escalating antibiotic coverage.  -Right ankle with erythema and swelling. XR rules out fracture but mentions soft tissue swelling, unsure how much this contributes -Continued broad spectrum Abx with vancomycin and cefepime given severe systemic symptoms and unclear etiology but will de-escalate to po Doxycycline and po Augmentin  -IVF Maintenance D/C'd  -LA trended down from 4.26 and repeat lactic acid was 1.4 -WBC is trended down is normal from 12.3 and now 6.8  Hypotension, resolved. -Initially patient presented with somewhat normal blood pressure which quickly escalated to concern for hypertensive emergency with SBP of 035 and diastolic of 1 teens. Patient received enalaprilat IV x1 on 11/17 and subsequently developed blood pressure with SBP's in the 90s on admission.   -Resolved with Maintenance IV fluids and fluid bolus resuscitation. -BP is now 164/81  Hypertension, not at goal.   -After holding blood pressure medications due to hypotension patient's blood pressure was now 164/81this Afternoon -Resumed home Amlodipine and will c/w IV hydralazine PRN. -Review of Records show she was taking Metoprolol Tartrate; Will resume at half  the dose and start 25 mg po BID -Continue to monitor.  Judicious control to prevent another hypotensive  episode as mentioned above  Acute metabolic/toxic encephalopathy. Improving.  -Alert and oriented to person, place, time, context.  -Likely delirium in the setting of severe sepsis in patient with known dementia.  -Otherwise no acute neurologic findings. -C/w Delirium precautions -Continue Zyprexa 5 mg qHS -Likely has baseline dementia   Hypokalemia, chronic -Potassium this AM was 3.3 -Takes oral potassium at home in the setting of Lasix use. Currently holding Lasix given previous hypotensive episodes during admission and because she was getting IVF Hydration; -Will D/C IVF today and resume Lasix in AM  -Replete with p.o. potassium chloride 40 mg twice daily x2 doses -Was given IV potassium chloride 40 mEq x 1 yesterday -Continue monitor and replete as necessary -Repeat CMP in a.m.  Hypophosphatemia -Phos Level this AM was 2.3 -Replete with po K Phos Neutral 500 mg po BID x2 doses -Continue to Monitor and Replete as Necessary -Repeat Phos Level in AM   Systolic Ejection Murmur.  -Possible aortic stenosis versus tricuspid regurg in the setting of pulmonary hypertension as evident by chest x-ray.  -Patient states she has a known murmur but no ECHO on filue -No active need for echo currently will pursue if patient bacteremic or persists to have fever that can suggest possible endocarditis -Continue to monitor and will need outpatient Cardiac Workup but would not recommend definitive treatment given goals of care   Normocytic Anemia -Patient's hemoglobin/hematocrit is stable at 10.6/32.2 -Continue to monitor signs and symptoms of bleeding -Check anemia panel in a.m. -Repeat CBC in the a.m.  DVT prophylaxis: Enoxaparin 40 mg sq Daily Code Status: DO NOT RESUSCITATE Family Communication: No family present at bedside Disposition Plan: Remain Inpatient for current treatment and anticipate D/C back to ALF/SNF in AM; Will get PT to re-evaluate and they are recommending  SNF/Supervision/Assistance  Consultants:   None  Procedures:   None  Antimicrobials:  Anti-infectives (From admission, onward)   Start     Dose/Rate Route Frequency Ordered Stop   08/27/18 1200  doxycycline (VIBRA-TABS) tablet 100 mg     100 mg Oral Every 12 hours 08/27/18 1049     08/27/18 1200  amoxicillin-clavulanate (AUGMENTIN) 875-125 MG per tablet 1 tablet     1 tablet Oral Every 12 hours 08/27/18 1049     08/25/18 0600  vancomycin (VANCOCIN) IVPB 750 mg/150 ml premix  Status:  Discontinued     750 mg 150 mL/hr over 60 Minutes Intravenous Every 24 hours 08/24/18 0248 08/27/18 1049   08/24/18 1100  ceFEPIme (MAXIPIME) 1 g in sodium chloride 0.9 % 100 mL IVPB  Status:  Discontinued     1 g 200 mL/hr over 30 Minutes Intravenous Every 12 hours 08/24/18 0012 08/24/18 0138   08/24/18 0600  ceFEPIme (MAXIPIME) 1 g in sodium chloride 0.9 % 100 mL IVPB  Status:  Discontinued     1 g 200 mL/hr over 30 Minutes Intravenous Every 12 hours 08/24/18 0138 08/27/18 1049   08/24/18 0030  vancomycin (VANCOCIN) 1,500 mg in sodium chloride 0.9 % 500 mL IVPB     1,500 mg 250 mL/hr over 120 Minutes Intravenous  Once 08/24/18 0020 08/24/18 0414   08/23/18 2315  azithromycin (ZITHROMAX) 500 mg in sodium chloride 0.9 % 250 mL IVPB     500 mg 250 mL/hr over 60 Minutes Intravenous  Once 08/23/18 2301 08/24/18 0040   08/23/18 2145  cefTRIAXone (ROCEPHIN)  1 g in sodium chloride 0.9 % 100 mL IVPB     1 g 200 mL/hr over 30 Minutes Intravenous  Once 08/23/18 2140 08/23/18 2303     Subjective: Seen and examined and was extremely pleasant.  States that her right ankle was a little bit sore.  No chest pain, lightheadedness or dizziness.  No other concerns or complaints at this time and feels well.  Objective: Vitals:   08/26/18 2102 08/27/18 0635 08/27/18 1356 08/27/18 1506  BP: (!) 159/67 (!) 156/77 (!) 145/84 (!) 164/81  Pulse: 65 71 72   Resp: 20 16 19    Temp: 98 F (36.7 C) 98.5 F (36.9 C)  97.8 F (36.6 C)   TempSrc: Oral Oral Oral   SpO2: 99% 96% 98%   Weight:      Height:        Intake/Output Summary (Last 24 hours) at 08/27/2018 1516 Last data filed at 08/26/2018 1800 Gross per 24 hour  Intake 1487.08 ml  Output -  Net 1487.08 ml   Filed Weights   08/23/18 2139 08/24/18 0243  Weight: 70.3 kg 68.3 kg   Examination: Physical Exam:  Constitutional: Well-nourished, well-developed overweight Caucasian female currently no acute distress appears calm and comfortable Eyes: Conjunctive are normal.  Sclera anicteric ENMT: External ears nose appear normal.  Slightly hard of hearing Neck: Appears supple no JVD Respiratory: Diminished to auscultation bilaterally with no appreciable wheezing, rales, rhonchi.  Patient not tachypneic wheezing excess muscle breathe Cardiovascular: Regular rate and rhythm.  Has a loud systolic murmur.  Has minimal lower extremity edema Abdomen: Soft, nontender, nondistended.  Bowel sounds present in 4 quadrants GU: Deferred Musculoskeletal: No contractures or cyanosis.  Normal strength noted. Skin: Skin is warm and dry.  Does have some erythema and warmth to the right medial ankle and a skin lesion above on her shin.  No induration noted Neurologic: Cranial nerves II through XII grossly intact no appreciable focal deficits.  Romberg sign and cerebellar reflexes were not assessed Psychiatric: Normal judgment intact.  Patient is awake and alert and oriented x2.  Pleasant mood and affect.  Appears somewhat demented but is pleasant  Data Reviewed: I have personally reviewed following labs and imaging studies  CBC: Recent Labs  Lab 08/23/18 2138 08/24/18 0031 08/24/18 0525 08/25/18 0240 08/26/18 0622 08/27/18 0610  WBC 14.4* 14.4* 13.6* 12.3* 7.5 6.8  NEUTROABS 11.3*  --  10.7* 7.6 4.3 3.6  HGB 11.1* 10.4* 11.0* 10.2* 10.6* 10.6*  HCT 33.5* 31.6* 33.8* 31.3* 32.1* 32.2*  MCV 88.6 89.8 90.4 90.2 87.5 88.7  PLT 187 184 177 169 225 412    Basic Metabolic Panel: Recent Labs  Lab 08/23/18 2138 08/24/18 0031 08/24/18 0525 08/25/18 0240 08/26/18 0622 08/27/18 0610  NA 136  --  139 138 137 139  K 2.7*  --  2.9* 3.0* 2.8* 3.3*  CL 105  --  110 111 108 109  CO2 21*  --  17* 19* 21* 21*  GLUCOSE 131*  --  111* 88 98 92  BUN 20  --  12 12 8 8   CREATININE 0.89 0.89 0.68 0.57 0.58 0.54  CALCIUM 8.5*  --  7.8* 7.8* 8.2* 8.5*  MG  --  1.7  --  2.0 1.9 2.0  PHOS  --   --   --   --   --  2.3*   GFR: Estimated Creatinine Clearance: 42.1 mL/min (by C-G formula based on SCr of 0.54 mg/dL). Liver Function Tests:  Recent Labs  Lab 08/23/18 2138 08/27/18 0610  AST 21 17  ALT 13 14  ALKPHOS 40 38  BILITOT 0.5 0.7  PROT 6.0* 5.8*  ALBUMIN 2.9* 2.6*   No results for input(s): LIPASE, AMYLASE in the last 168 hours. No results for input(s): AMMONIA in the last 168 hours. Coagulation Profile: No results for input(s): INR, PROTIME in the last 168 hours. Cardiac Enzymes: No results for input(s): CKTOTAL, CKMB, CKMBINDEX, TROPONINI in the last 168 hours. BNP (last 3 results) No results for input(s): PROBNP in the last 8760 hours. HbA1C: No results for input(s): HGBA1C in the last 72 hours. CBG: No results for input(s): GLUCAP in the last 168 hours. Lipid Profile: No results for input(s): CHOL, HDL, LDLCALC, TRIG, CHOLHDL, LDLDIRECT in the last 72 hours. Thyroid Function Tests: No results for input(s): TSH, T4TOTAL, FREET4, T3FREE, THYROIDAB in the last 72 hours. Anemia Panel: No results for input(s): VITAMINB12, FOLATE, FERRITIN, TIBC, IRON, RETICCTPCT in the last 72 hours. Sepsis Labs: Recent Labs  Lab 08/23/18 2243 08/24/18 0044 08/24/18 0525 08/24/18 1611  LATICACIDVEN 2.95* 4.26* 2.9* 1.4    Recent Results (from the past 240 hour(s))  Urine culture     Status: None   Collection Time: 08/23/18  9:39 PM  Result Value Ref Range Status   Specimen Description   Final    URINE, CLEAN CATCH Performed at [facility], [place] [address], [place], [place] [address]    Special Requests   Final    NONE Performed at [facility] [place] [address], [place], [place] [address]    Culture   Final    NO GROWTH Performed at [facility], [place] [address] [place], [place] [address]    Report Status 08/25/2018 FINAL  Final  Blood Culture (routine x 2)     Status: None (Preliminary result)   Collection Time: 08/23/18  9:43 PM  Result Value Ref Range Status   Specimen Description   Final    BLOOD RIGHT HAND Performed at [place] [address] [place], [place] [address]    Special Requests   Final    BOTTLES DRAWN AEROBIC AND ANAEROBIC Blood Culture results may not be optimal due to an excessive volume of blood received in culture bottles Performed at [facility] [address], [place], [place] [address]    Culture   Final    NO GROWTH 3 DAYS Performed at [facility], [place] [address] [place], [place] [address]    Report Status PENDING  Incomplete  Blood Culture (routine x 2)     Status: None (Preliminary result)   Collection Time: 08/23/18  9:56 PM  Result Value Ref Range Status   Specimen Description BLOOD RIGHT FOREARM  Final   Special Requests   Final    BOTTLES DRAWN AEROBIC AND ANAEROBIC Blood Culture adequate volume   Culture   Final    NO GROWTH 3 DAYS Performed at [facility], [place] [address] [place], [place] [address]    Report Status PENDING  Incomplete  MRSA PCR Screening     Status: None   Collection Time: 08/24/18  2:45 AM  Result Value Ref Range Status   MRSA by PCR NEGATIVE NEGATIVE Final    Comment:        The GeneXpert MRSA Assay (FDA approved for NASAL specimens only), is one component of a comprehensive MRSA colonization surveillance program. It is  not intended to diagnose MRSA infection nor to guide or monitor treatment for MRSA infections. Performed at [facility], [place] [address] [place], [place] [address]     Radiology Studies: No results found. Scheduled Meds: . amLODipine  10 mg Oral Daily  . amoxicillin-clavulanate  1 tablet Oral Q12H  . divalproex  250 mg Oral BID  . doxycycline  100 mg Oral Q12H  . enoxaparin (LOVENOX) injection  40 mg Subcutaneous Daily  . OLANZapine  5 mg Oral QHS  . phosphorus  500 mg Oral BID  . potassium chloride  40 mEq Oral BID  . traZODone  50 mg Oral QHS   Continuous Infusions: . sodium chloride 10 mL/hr at 08/24/18 0415  . sodium chloride 75 mL/hr at 08/26/18 1800    LOS: 3 days    Kerney Elbe, DO [facility] PAGER is on AMION  If 7PM-7AM, please contact night-coverage [email or web address] Password [facility] 08/27/2018, 3:16 PM

## 2018-08-28 LAB — COMPREHENSIVE METABOLIC PANEL
ALBUMIN: 3.1 g/dL — AB (ref 3.5–5.0)
ALT: 17 U/L (ref 0–44)
AST: 23 U/L (ref 15–41)
Alkaline Phosphatase: 51 U/L (ref 38–126)
Anion gap: 11 (ref 5–15)
BILIRUBIN TOTAL: 0.5 mg/dL (ref 0.3–1.2)
BUN: 10 mg/dL (ref 8–23)
CO2: 22 mmol/L (ref 22–32)
CREATININE: 0.61 mg/dL (ref 0.44–1.00)
Calcium: 9 mg/dL (ref 8.9–10.3)
Chloride: 107 mmol/L (ref 98–111)
GFR calc Af Amer: >60 mL/min (ref >60)
GLUCOSE: 118 mg/dL — AB (ref 70–99)
Potassium: 3.2 mmol/L — ABNORMAL LOW (ref 3.5–5.1)
Sodium: 140 mmol/L (ref 135–145)
TOTAL PROTEIN: 6.7 g/dL (ref 6.5–8.1)

## 2018-08-28 LAB — CBC WITH DIFFERENTIAL/PLATELET
ABS IMMATURE GRANULOCYTES: 0.14 10*3/uL — AB (ref 0.00–0.07)
BASOS ABS: 0.1 10*3/uL (ref 0.0–0.1)
Basophils Relative: 1 %
EOS ABS: 0.4 10*3/uL (ref 0.0–0.5)
Eosinophils Relative: 5 %
HCT: 38.6 % (ref 36.0–46.0)
Hemoglobin: 12.8 g/dL (ref 12.0–15.0)
IMMATURE GRANULOCYTES: 2 %
LYMPHS ABS: 2.6 10*3/uL (ref 0.7–4.0)
Lymphocytes Relative: 35 %
MCH: 29 pg (ref 26.0–34.0)
MCHC: 33.2 g/dL (ref 30.0–36.0)
MCV: 87.5 fL (ref 80.0–100.0)
MONOS PCT: 12 %
Monocytes Absolute: 0.9 10*3/uL (ref 0.1–1.0)
NEUTROS PCT: 45 %
Neutro Abs: 3.5 10*3/uL (ref 1.7–7.7)
Platelets: 296 10*3/uL (ref 150–400)
RBC: 4.41 MIL/uL (ref 3.87–5.11)
RDW: 14 % (ref 11.5–15.5)
WBC: 7.5 10*3/uL (ref 4.0–10.5)
nRBC: 0 % (ref 0.0–0.2)

## 2018-08-28 LAB — IRON AND TIBC
IRON: 49 ug/dL (ref 28–170)
Saturation Ratios: 21 % (ref 10.4–31.8)
TIBC: 232 ug/dL — ABNORMAL LOW (ref 250–450)
UIBC: 183 ug/dL

## 2018-08-28 LAB — RETICULOCYTES
IMMATURE RETIC FRACT: 10.5 % (ref 2.3–15.9)
RBC.: 4.41 MIL/uL (ref 3.87–5.11)
RETIC COUNT ABSOLUTE: 52.5 10*3/uL (ref 19.0–186.0)
RETIC CT PCT: 1.2 % (ref 0.4–3.1)

## 2018-08-28 LAB — MAGNESIUM: Magnesium: 1.8 mg/dL (ref 1.7–2.4)

## 2018-08-28 LAB — VITAMIN B12: Vitamin B-12: 848 pg/mL (ref 180–914)

## 2018-08-28 LAB — FERRITIN: FERRITIN: 256 ng/mL (ref 11–307)

## 2018-08-28 LAB — PHOSPHORUS: PHOSPHORUS: 3.5 mg/dL (ref 2.5–4.6)

## 2018-08-28 LAB — FOLATE: Folate: 13.6 ng/mL (ref >5.9)

## 2018-08-28 MED ORDER — LORAZEPAM 2 MG/ML IJ SOLN
1.0000 mg | Freq: Once | INTRAMUSCULAR | Status: AC
  Administered 2018-08-28: 1 mg via INTRAVENOUS
  Filled 2018-08-28: qty 1

## 2018-08-28 MED ORDER — FUROSEMIDE 20 MG PO TABS
20.0000 mg | ORAL_TABLET | ORAL | Status: DC
  Administered 2018-08-28 – 2018-08-31 (×2): 20 mg via ORAL
  Filled 2018-08-28 (×3): qty 1

## 2018-08-28 MED ORDER — POTASSIUM CHLORIDE CRYS ER 20 MEQ PO TBCR
40.0000 meq | EXTENDED_RELEASE_TABLET | Freq: Two times a day (BID) | ORAL | Status: DC
  Administered 2018-08-28: 40 meq via ORAL
  Filled 2018-08-28: qty 2

## 2018-08-28 MED ORDER — POTASSIUM CHLORIDE 10 MEQ/100ML IV SOLN
10.0000 meq | INTRAVENOUS | Status: AC
  Administered 2018-08-28 (×4): 10 meq via INTRAVENOUS
  Filled 2018-08-28 (×4): qty 100

## 2018-08-28 MED ORDER — HALOPERIDOL LACTATE 5 MG/ML IJ SOLN
2.0000 mg | Freq: Once | INTRAMUSCULAR | Status: AC
  Administered 2018-08-28: 2 mg via INTRAVENOUS
  Filled 2018-08-28: qty 1

## 2018-08-28 MED ORDER — LORAZEPAM 2 MG/ML IJ SOLN
1.0000 mg | Freq: Once | INTRAMUSCULAR | Status: DC
  Filled 2018-08-28: qty 1

## 2018-08-28 MED ORDER — ACETAMINOPHEN 325 MG PO TABS
650.0000 mg | ORAL_TABLET | Freq: Four times a day (QID) | ORAL | Status: DC
  Administered 2018-08-29 – 2018-08-31 (×6): 650 mg via ORAL
  Filled 2018-08-28 (×6): qty 2

## 2018-08-28 NOTE — Progress Notes (Signed)
PROGRESS NOTE    [name]  BSW:[id number] DOB: [birth year] DOA: 08/23/2018 PCP: Crist Infante, MD   Brief Narrative: Gabrielle Gambrill [name] a 84 y.o.year old femalewith medical history significant for dementia [facility] resident), hx of breast CA, GERD, HTN, TIAwho presented on 11/17/2019withfever and generalized malaiseand was found to havesevere sepsis of unclear etiology.  History limited by patient with dementia and acute confusion in setting of likely infection. Per chart review patient was previously treated with doxycycline for reported lower leg cellulitis a week prior to presentation. Per staffing at SNF patient had fever for unknown period of time.  ED course: T-max one 1.3, tachypnea to 22, initial blood pressure 150/70. Lab work significant for lactic acid of 2.95, WBC 14.4, creatinine 0.89,Potassium 2.7, UA negative for signs of infection. Chest x-ray with no active disease but bilateral hilar fullness/related to pulmonary arterial hypertension. In the ED patient received 1 L LR bolus for lactic acidosis and empirically started on ceftriaxone and azithromycin after obtaining blood cultures x2.  Hospital course:Patient was initially admitted to floor but found to have low blood pressure with SBP's in the 90s as well as worsening lactic acidosis. Patient was broadened out to vancomycin and cefepime and transferred to stepdown unit on 11/17. Of note, on admission blood pressure was initially 140s over 90s. Later in the morning blood pressure found to be 200s over 100s and patient received enalaprilat IV x1. Within 2 hours blood pressure dropped to 90s over 50s requiring fluid boluses in the setting of worsening lactic acidosis.  **Sepsis likely secondary to her right lower leg cellulitis even though is not very impressive. Abx to be changed to po yesterday with anticipation of D/C to SNF this AM but patient became confused and belligerent so was placed in an  abdominal restraint and given haldol overnight. Doing well this AM and restraints removed but had behavioral issues again this AM but responded well to Lorazepam.   Assessment & Plan:   Active Problems:   Hypertension   Severe sepsis (HCC)   Hypokalemia   Lactic acidosis   Hypotension   Acute metabolic encephalopathy  Severe Sepsis, Improved  -Lactic acid resolved with IV fluids, patient now afebrile, Still with slight leukocytosis but now normalized  -Unclear etiology as chest x-ray with no acute findings and patient with no respiratory symptoms but suspect it was from incomplete treatment of Right LE Cellulitis.  -UA unremarkable and urine and blood culture still pending.  -Concern for right lower extremity cellulitis however not very impressive on my exam but given patient was previously treated with doxycycline and severity of symptoms must rule out MRSA before de-escalating antibiotic coverage.  -Right ankle with erythema and swelling. XR rules out fracture but mentions soft tissue swelling, unsure how much this contributes -Continued broad spectrum Abx with vancomycin and cefepime given severe systemic symptoms and unclear etiology but will de-escalate to po Doxycycline and po Augmentin on 08/27/18 -IVF Maintenance D/C'd  -LA trended down from 4.26 and repeat lactic acid was 1.4 -WBC is trended down is normal from 12.3 and now 7.5  Hypotension, resolved. -Initially patient presented with somewhat normal blood pressure which quickly escalated to concern for hypertensive emergency with SBP of 466 and diastolic of 1 teens. Patient received enalaprilat IV x1 on 11/17 and subsequently developed blood pressure with SBP's in the 90s on admission.   -Resolved with Maintenance IV fluids and fluid bolus resuscitation. -BP is now 132/80  Hypertension, not at goal.   -After  holding blood pressure medications due to hypotension patient's blood pressure was now 132/80 this  Afternoon -Resumed home Amlodipine and will c/w IV hydralazine PRN. -Review of Records show she was taking Metoprolol Tartrate; Resumed at half the dose and start 25 mg po BID -Continue to monitor.  Judicious control to prevent another hypotensive episode as mentioned above  Acute metabolic/toxic encephalopathy superimposed on Chronic Dementia -Alert and oriented to person, place, time, context.  -Likely delirium in the setting of severe sepsis in patient with known dementia.  -Otherwise no acute neurologic findings. -C/w Delirium precautions -Continue Zyprexa 5 mg qHS -Likely has baseline dementia and had behavioral disturbances overnight yesterday -Given Haldol 2 mg IV and had to be restrained; Restraints discontinued -Will avoid a sitter in anticipation to D/C in AM -C/w Lorazepam 0.5 mg po q6hprn Anxiety and given 1 mg IV once for agitation this Afternoon  Hypokalemia, chronic -Potassium this AM was 3.2 -Takes oral potassium at home in the setting of Lasix use. Currently holding Lasix given previous hypotensive episodes during admission and because she was getting IVF Hydration; -Will D/C IVF today and resume Lasix the way she was taking it with 20 mg po MWF -Replete with p.o. potassium chloride 40 mg twice daily x2 doses and IV potassium chloride 40 mEq x 1  -Continue monitor and replete as necessary -Repeat CMP in a.m.  Hypophosphatemia -Phos Level this AM was 3.5 -Continue to Monitor and Replete as Necessary -Repeat Phos Level in AM   Systolic Ejection Murmur.  -Possible aortic stenosis versus tricuspid regurg in the setting of pulmonary hypertension as evident by chest x-ray.  -Patient states she has a known murmur but no ECHO on filue -No active need for echo currently will pursue if patient bacteremic or persists to have fever that can suggest possible endocarditis -Continue to monitor and will need outpatient Cardiac Workup but would not recommend definitive  treatment given goals of care   Normocytic Anemia -Patient's hemoglobin/hematocrit is stable at 10.6/32.2 -Continue to monitor signs and symptoms of bleeding -Checked Anemia Panel showed an iron level 49, TIBC 183, TIBC of 232, saturation ratios of 21%, ferritin level 256, folate level 13.6, and vitamin B12 level of 848 -Repeat CBC in the a.m.  Hyperglycemia -Likely Reactive -Check HbA1c as an outpatient -CBG's on BMP's/CMP's ranging from 88-118 -Continue to Monitor   DVT prophylaxis: Enoxaparin 40 mg sq Daily Code Status: DO NOT RESUSCITATE Family Communication: No family present at bedside Disposition Plan: Remain Inpatient for current treatment and anticipate D/C back to SNF in AM  Consultants:   None  Procedures:   None  Antimicrobials:  Anti-infectives (From admission, onward)   Start     Dose/Rate Route Frequency Ordered Stop   08/27/18 1200  doxycycline (VIBRA-TABS) tablet 100 mg     100 mg Oral Every 12 hours 08/27/18 1049     08/27/18 1200  amoxicillin-clavulanate (AUGMENTIN) 875-125 MG per tablet 1 tablet     1 tablet Oral Every 12 hours 08/27/18 1049     08/25/18 0600  vancomycin (VANCOCIN) IVPB 750 mg/150 ml premix  Status:  Discontinued     750 mg 150 mL/hr over 60 Minutes Intravenous Every 24 hours 08/24/18 0248 08/27/18 1049   08/24/18 1100  ceFEPIme (MAXIPIME) 1 g in sodium chloride 0.9 % 100 mL IVPB  Status:  Discontinued     1 g 200 mL/hr over 30 Minutes Intravenous Every 12 hours 08/24/18 0012 08/24/18 0138   08/24/18 0600  ceFEPIme (MAXIPIME)  1 g in sodium chloride 0.9 % 100 mL IVPB  Status:  Discontinued     1 g 200 mL/hr over 30 Minutes Intravenous Every 12 hours 08/24/18 0138 08/27/18 1049   08/24/18 0030  vancomycin (VANCOCIN) 1,500 mg in sodium chloride 0.9 % 500 mL IVPB     1,500 mg 250 mL/hr over 120 Minutes Intravenous  Once 08/24/18 0020 08/24/18 0414   08/23/18 2315  azithromycin (ZITHROMAX) 500 mg in sodium chloride 0.9 % 250 mL IVPB      500 mg 250 mL/hr over 60 Minutes Intravenous  Once 08/23/18 2301 08/24/18 0040   08/23/18 2145  cefTRIAXone (ROCEPHIN) 1 g in sodium chloride 0.9 % 100 mL IVPB     1 g 200 mL/hr over 30 Minutes Intravenous  Once 08/23/18 2140 08/23/18 2303     Subjective: Seen and examined she is lying in bed with no active issues.  She is awake and pleasant but somewhat confused.  After I left the room she became agitated and had to be given IV lorazepam.  She denies any chest pain, lightheadedness or dizziness and thinks that her legs are getting better.  Ankle is not as sore today and erythema on the ankle is improved.  No other concerns or complaints at this time.  Objective: Vitals:   08/27/18 2111 08/28/18 0521 08/28/18 1042 08/28/18 1349  BP: 134/75 (!) 150/81 126/85 132/80  Pulse: 68 77 69 66  Resp: 17 16 16 16   Temp: 98.1 F (36.7 C) 97.8 F (36.6 C) 98.1 F (36.7 C) (!) 97.2 F (36.2 C)  TempSrc: Oral Oral Oral Axillary  SpO2: 97% 100% 96% 100%  Weight:      Height:        Intake/Output Summary (Last 24 hours) at 08/28/2018 1444 Last data filed at 08/28/2018 1200 Gross per 24 hour  Intake 480 ml  Output 1100 ml  Net -620 ml   Filed Weights   08/23/18 2139 08/24/18 0243  Weight: 70.3 kg 68.3 kg   Examination: Physical Exam:  Constitutional: Well-nourished, well-developed overweight Caucasian female currently no acute distress appears calm and comfortable Eyes: Conjunctive a are normal.  Sclera anicteric. ENMT: External ears and nose appear normal.  Slightly hard of hearing Neck: Supple no JVD Respiratory: Diminished to auscultation bilaterally no appreciable wheezing, rales, rhonchi.  Patient not tachypneic or using any accessory muscles to breathe Cardiovascular: Regular rate and rhythm.  Has a loud systolic murmur.  Has trace - 1+ lower extremity edema Abdomen: Soft, nontender, nondistended.  Bowel sounds present all 4 quadrants GU: Deferred Musculoskeletal: No contractures  cyanosis.  Normal strength noted Skin: Skin is warm and dry.  Erythema and warmth on right medial ankle is improving and has a skin lesion above her shin which is also improved Neurologic: Cranial nerves II through XII grossly intact with no appreciable focal deficits Psychiatric: Pleasantly confused and demented.  She is awake and alert.  She is oriented x2  Data Reviewed: I have personally reviewed following labs and imaging studies  CBC: Recent Labs  Lab 08/24/18 0525 08/25/18 0240 08/26/18 0622 08/27/18 0610 08/28/18 0628  WBC 13.6* 12.3* 7.5 6.8 7.5  NEUTROABS 10.7* 7.6 4.3 3.6 3.5  HGB 11.0* 10.2* 10.6* 10.6* 12.8  HCT 33.8* 31.3* 32.1* 32.2* 38.6  MCV 90.4 90.2 87.5 88.7 87.5  PLT 177 169 225 226 762   Basic Metabolic Panel: Recent Labs  Lab 08/24/18 0031 08/24/18 0525 08/25/18 0240 08/26/18 0622 08/27/18 8315 08/28/18 1761  NA  --  139 138 137 139 140  K  --  2.9* 3.0* 2.8* 3.3* 3.2*  CL  --  110 111 108 109 107  CO2  --  17* 19* 21* 21* 22  GLUCOSE  --  111* 88 98 92 118*  BUN  --  12 12 8 8 10   CREATININE 0.89 0.68 0.57 0.58 0.54 0.61  CALCIUM  --  7.8* 7.8* 8.2* 8.5* 9.0  MG 1.7  --  2.0 1.9 2.0 1.8  PHOS  --   --   --   --  2.3* 3.5   GFR: Estimated Creatinine Clearance: 42.1 mL/min (by C-G formula based on SCr of 0.61 mg/dL). Liver Function Tests: Recent Labs  Lab 08/23/18 2138 08/27/18 0610 08/28/18 0628  AST 21 17 23   ALT 13 14 17   ALKPHOS 40 38 51  BILITOT 0.5 0.7 0.5  PROT 6.0* 5.8* 6.7  ALBUMIN 2.9* 2.6* 3.1*   No results for input(s): LIPASE, AMYLASE in the last 168 hours. No results for input(s): AMMONIA in the last 168 hours. Coagulation Profile: No results for input(s): INR, PROTIME in the last 168 hours. Cardiac Enzymes: No results for input(s): CKTOTAL, CKMB, CKMBINDEX, TROPONINI in the last 168 hours. BNP (last 3 results) No results for input(s): PROBNP in the last 8760 hours. HbA1C: No results for input(s): HGBA1C in the last  72 hours. CBG: No results for input(s): GLUCAP in the last 168 hours. Lipid Profile: No results for input(s): CHOL, HDL, LDLCALC, TRIG, CHOLHDL, LDLDIRECT in the last 72 hours. Thyroid Function Tests: No results for input(s): TSH, T4TOTAL, FREET4, T3FREE, THYROIDAB in the last 72 hours. Anemia Panel: Recent Labs    08/28/18 0628  VITAMINB12 848  FOLATE 13.6  FERRITIN 256  TIBC 232*  IRON 49  RETICCTPCT 1.2   Sepsis Labs: Recent Labs  Lab 08/23/18 2243 08/24/18 0044 08/24/18 0525 08/24/18 1611  LATICACIDVEN 2.95* 4.26* 2.9* 1.4    Recent Results (from the past 240 hour(s))  Urine culture     Status: None   Collection Time: 08/23/18  9:39 PM  Result Value Ref Range Status   Specimen Description   Final    URINE, CLEAN CATCH Performed at [facility], [place] [address], [place], [place] [address]    Special Requests   Final    NONE Performed at [facility], [place] [address], [place], [place] [address]    Culture   Final    NO GROWTH Performed at [facility], [place] [address] [place], [place] [address]    Report Status 08/25/2018 FINAL  Final  Blood Culture (routine x 2)     Status: None (Preliminary result)   Collection Time: 08/23/18  9:43 PM  Result Value Ref Range Status   Specimen Description   Final    BLOOD RIGHT HAND Performed at [place] [address] [place], [place] [address]    Special Requests   Final    BOTTLES DRAWN AEROBIC AND ANAEROBIC Blood Culture results may not be optimal due to an excessive volume of blood received in culture bottles Performed at [place] [address], [place], [place] [address]    Culture   Final    NO GROWTH 4 DAYS Performed at [facility], [place] [address] [place],  [address]    Report Status PENDING  Incomplete  Blood Culture (routine x 2)     Status: None (  Preliminary result)   Collection Time: 08/23/18  9:56 PM   Result Value Ref Range Status   Specimen Description BLOOD RIGHT FOREARM  Final   Special Requests   Final    BOTTLES DRAWN AEROBIC AND ANAEROBIC Blood Culture adequate volume   Culture   Final    NO GROWTH 4 DAYS Performed at [facility], [address] [place], [place] [address]    Report Status PENDING  Incomplete  MRSA PCR Screening     Status: None   Collection Time: 08/24/18  2:45 AM  Result Value Ref Range Status   MRSA by PCR NEGATIVE NEGATIVE Final    Comment:        The GeneXpert MRSA Assay (FDA approved for NASAL specimens only), is one component of a comprehensive MRSA colonization surveillance program. It is not intended to diagnose MRSA infection nor to guide or monitor treatment for MRSA infections. Performed at [facility], [place] [address], [place], [place] [address]     Radiology Studies: No results found. Scheduled Meds: . amLODipine  10 mg Oral Daily  . amoxicillin-clavulanate  1 tablet Oral Q12H  . divalproex  250 mg Oral BID  . doxycycline  100 mg Oral Q12H  . enoxaparin (LOVENOX) injection  40 mg Subcutaneous Daily  . metoprolol tartrate  25 mg Oral BID  . OLANZapine  5 mg Oral QHS  . potassium chloride  40 mEq Oral BID  . traZODone  50 mg Oral QHS   Continuous Infusions: . sodium chloride 10 mL/hr at 08/24/18 0415    LOS: 4 days    Kerney Elbe, DO [facility] PAGER is on AMION  If 7PM-7AM, please contact night-coverage [email or web address] Password [facility] 08/28/2018, 2:44 PM

## 2018-08-28 NOTE — Evaluation (Signed)
Occupational Therapy Evaluation Patient Details Name: [name] MRN: [id number] DOB: 02-Jan-1926 Today's Date: 08/28/2018    History of Present Illness 84 y.o.year old femalewith medical history significant for dementia [name] resident), pelvic fx March 2019, hx of breast CA, GERD, HTN, TIAwho presented on 11/17/2019withfever and generalized malaiseand was found to havesevere sepsis of unclear etiology.   Clinical Impression   Pt was admitted for the above. She is from ALF.  Unsure of how much ADL assistance she needs.  Right now she needs mod A overall. She is VERY UNSTEADY WITH MULTIPLE POSTERIOR LOB.  Will follow in acute setting with min A level goals focusing on balance for adls and toileting    Follow Up Recommendations  SNF    Equipment Recommendations  3 in 1 bedside commode    Recommendations for Other Services       Precautions / Restrictions Precautions Precautions: Fall Precaution Comments: h/o multiple falls with pelic fx March 7510.  VERY UNSTEADY WITH MULTIPLE LOB Restrictions Weight Bearing Restrictions: No      Mobility Bed Mobility               General bed mobility comments: min A to clear foot from bottom bedrail  Transfers   Equipment used: Rolling walker (2 wheeled)   Sit to Stand: Min assist;Mod assist Stand pivot transfers: Mod assist       General transfer comment: pt with multiple LOB and required strong assistance to right self; LOB was posterior each time    Balance                                           ADL either performed or assessed with clinical judgement   ADL Overall ADL's : Needs assistance/impaired Eating/Feeding: Set up;Supervision/ safety   Grooming: Set up;Supervision/safety   Upper Body Bathing: Moderate assistance   Lower Body Bathing: Maximal assistance;Sit to/from stand   Upper Body Dressing : Maximal assistance Upper Body Dressing Details (indicate cue type and  reason): attempting to pull sleeves up towards shoulders Lower Body Dressing: Sit to/from stand;Moderate assistance Lower Body Dressing Details (indicate cue type and reason): able to pull socks up Toilet Transfer: Moderate assistance;Stand-pivot;BSC;RW   Toileting- Clothing Manipulation and Hygiene: Sit to/from stand;Total assistance         General ADL Comments: used 3:1 commode. Pt very unsteady with multiple LOB     Vision         Perception     Praxis      Pertinent Vitals/Pain Pain Assessment: Faces Faces Pain Scale: No hurt     Hand Dominance     Extremity/Trunk Assessment Upper Extremity Assessment Upper Extremity Assessment: Generalized weakness           Communication Communication Communication: HOH   Cognition Arousal/Alertness: Awake/alert Behavior During Therapy: WFL for tasks assessed/performed Overall Cognitive Status: No family/caregiver present to determine baseline cognitive functioning                                 General Comments: h/o dementia.  Follows one step commands and performs functional activities with cues.  Decreased STM safety   General Comments       Exercises     Shoulder Instructions      Home Living Family/patient expects to be discharged to::  Skilled nursing facility                                        Prior Functioning/Environment          Comments: uncertain of amount of ADL assistance        OT Problem List: Decreased strength;Impaired balance (sitting and/or standing);Decreased activity tolerance;Decreased cognition;Decreased safety awareness      OT Treatment/Interventions: Self-care/ADL training;DME and/or AE instruction;Balance training;Patient/family education;Therapeutic activities    OT Goals(Current goals can be found in the care plan section) Acute Rehab OT Goals Patient Stated Goal: unable to state OT Goal Formulation: Patient unable to participate in goal  setting Time For Goal Achievement: 09/11/18 Potential to Achieve Goals: Fair ADL Goals Pt Will Transfer to Toilet: with min assist;bedside commode;stand pivot transfer Additional ADL Goal #1: pt will go sit to stand and maintain static standing for 2 minutes with min A for adls  OT Frequency: Min 2X/week   Barriers to D/C:            Co-evaluation              AM-PAC PT "6 Clicks" Daily Activity     Outcome Measure Help from another person eating meals?: A Little Help from another person taking care of personal grooming?: A Little Help from another person toileting, which includes using toliet, bedpan, or urinal?: A Lot Help from another person bathing (including washing, rinsing, drying)?: A Lot Help from another person to put on and taking off regular upper body clothing?: A Lot Help from another person to put on and taking off regular lower body clothing?: A Lot 6 Click Score: 14   End of Session    Activity Tolerance: Patient tolerated treatment well Patient left: in chair;with call bell/phone within reach;with chair alarm set  OT Visit Diagnosis: Unsteadiness on feet (R26.81);Repeated falls (R29.6)                Time: 3845-3646 OT Time Calculation (min): 30 min Charges:  OT General Charges $OT Visit: 1 Visit OT Evaluation $OT Eval Low Complexity: 1 Low OT Treatments $Self Care/Home Management : 8-22 mins  [name], OTR/L Acute Rehabilitation Services [phone] [facility] pager [phone] office 08/28/2018  [name] 08/28/2018, 1:07 PM

## 2018-08-28 NOTE — Progress Notes (Signed)
PT Cancellation Note  Patient Details Name: [name] MRN: [id number] DOB: 01/29/1926   Cancelled Treatment:    Reason Eval/Treat Not Completed: Other (comment); spoke with RN who reports pt too difficult to manage earlier when up with OT and required further medication for agitation.  Will hold today per RN and attempt another day.   Reginia Naas 08/28/2018, 3:11 PM  Magda Kiel, [place] [phone] 08/28/2018

## 2018-08-28 NOTE — Progress Notes (Signed)
Pt has become more confused, agitated and combative this AM. Pt tried to punch, pinch and bite RNs and NTs. Gave PO ativan to help calm pt down but after over an hour, she became more aggressive. Received one time order for haldol 2mg . Will continue to monitor and reassess as needed.

## 2018-08-29 DIAGNOSIS — G20 Parkinson's disease: Secondary | ICD-10-CM

## 2018-08-29 DIAGNOSIS — F0281 Dementia in other diseases classified elsewhere with behavioral disturbance: Secondary | ICD-10-CM

## 2018-08-29 LAB — CBC WITH DIFFERENTIAL/PLATELET
Abs Immature Granulocytes: 0.14 10*3/uL — ABNORMAL HIGH (ref 0.00–0.07)
BASOS ABS: 0.1 10*3/uL (ref 0.0–0.1)
Basophils Relative: 1 %
EOS PCT: 6 %
Eosinophils Absolute: 0.4 10*3/uL (ref 0.0–0.5)
HEMATOCRIT: 38 % (ref 36.0–46.0)
HEMOGLOBIN: 12.4 g/dL (ref 12.0–15.0)
Immature Granulocytes: 2 %
LYMPHS ABS: 2.1 10*3/uL (ref 0.7–4.0)
LYMPHS PCT: 31 %
MCH: 28.5 pg (ref 26.0–34.0)
MCHC: 32.6 g/dL (ref 30.0–36.0)
MCV: 87.4 fL (ref 80.0–100.0)
MONO ABS: 0.8 10*3/uL (ref 0.1–1.0)
MONOS PCT: 11 %
Neutro Abs: 3.2 10*3/uL (ref 1.7–7.7)
Neutrophils Relative %: 49 %
Platelets: 311 10*3/uL (ref 150–400)
RBC: 4.35 MIL/uL (ref 3.87–5.11)
RDW: 14.2 % (ref 11.5–15.5)
WBC: 6.6 10*3/uL (ref 4.0–10.5)
nRBC: 0 % (ref 0.0–0.2)

## 2018-08-29 LAB — COMPREHENSIVE METABOLIC PANEL
ALBUMIN: 2.9 g/dL — AB (ref 3.5–5.0)
ALK PHOS: 47 U/L (ref 38–126)
ALT: 17 U/L (ref 0–44)
AST: 23 U/L (ref 15–41)
Anion gap: 9 (ref 5–15)
BILIRUBIN TOTAL: 0.6 mg/dL (ref 0.3–1.2)
BUN: 16 mg/dL (ref 8–23)
CALCIUM: 9 mg/dL (ref 8.9–10.3)
CO2: 24 mmol/L (ref 22–32)
Chloride: 107 mmol/L (ref 98–111)
Creatinine, Ser: 0.64 mg/dL (ref 0.44–1.00)
GFR calc Af Amer: >60 mL/min (ref >60)
GLUCOSE: 86 mg/dL (ref 70–99)
Potassium: 3.2 mmol/L — ABNORMAL LOW (ref 3.5–5.1)
Sodium: 140 mmol/L (ref 135–145)
TOTAL PROTEIN: 6.3 g/dL — AB (ref 6.5–8.1)

## 2018-08-29 LAB — CULTURE, BLOOD (ROUTINE X 2)
CULTURE: NO GROWTH
CULTURE: NO GROWTH
SPECIAL REQUESTS: ADEQUATE

## 2018-08-29 LAB — PHOSPHORUS: Phosphorus: 3.7 mg/dL (ref 2.5–4.6)

## 2018-08-29 LAB — MAGNESIUM: Magnesium: 2 mg/dL (ref 1.7–2.4)

## 2018-08-29 MED ORDER — POTASSIUM CHLORIDE CRYS ER 20 MEQ PO TBCR
40.0000 meq | EXTENDED_RELEASE_TABLET | Freq: Two times a day (BID) | ORAL | Status: AC
  Administered 2018-08-29: 40 meq via ORAL
  Filled 2018-08-29: qty 2

## 2018-08-29 MED ORDER — POTASSIUM CHLORIDE 10 MEQ/100ML IV SOLN
10.0000 meq | INTRAVENOUS | Status: AC
  Administered 2018-08-29 (×4): 10 meq via INTRAVENOUS
  Filled 2018-08-29 (×4): qty 100

## 2018-08-29 NOTE — Progress Notes (Signed)
PROGRESS NOTE    [name]  MGQ:[id number] DOB: 1926/03/21 DOA: 08/23/2018 PCP: Gabrielle Infante, MD   Brief Narrative: Gabrielle Comes [name] a 84 y.o.year old femalewith medical history significant for dementia [facility] resident), hx of breast CA, GERD, HTN, TIAwho presented on 11/17/2019withfever and generalized malaiseand was found to havesevere sepsis of unclear etiology.  History limited by patient with dementia and acute confusion in setting of likely infection. Per chart review patient was previously treated with doxycycline for reported lower leg cellulitis a week prior to presentation. Per staffing at SNF patient had fever for unknown period of time.  ED course: T-max one 1.3, tachypnea to 22, initial blood pressure 150/70. Lab work significant for lactic acid of 2.95, WBC 14.4, creatinine 0.89,Potassium 2.7, UA negative for signs of infection. Chest x-ray with no active disease but bilateral hilar fullness/related to pulmonary arterial hypertension. In the ED patient received 1 L LR bolus for lactic acidosis and empirically started on ceftriaxone and azithromycin after obtaining blood cultures x2.  Hospital course:Patient was initially admitted to floor but found to have low blood pressure with SBP's in the 90s as well as worsening lactic acidosis. Patient was broadened out to vancomycin and cefepime and transferred to stepdown unit on 11/17. Of note, on admission blood pressure was initially 140s over 90s. Later in the morning blood pressure found to be 200s over 100s and patient received enalaprilat IV x1. Within 2 hours blood pressure dropped to 90s over 50s requiring fluid boluses in the setting of worsening lactic acidosis.  **Sepsis likely secondary to her right lower leg cellulitis even though is not very impressive. Abx to be changed to po 08/27/18 with anticipation of D/C to SNF patient became confused and belligerent so was placed in an abdominal  restraint and given haldol overnight on 11/21-11/22. Restraints removed but had behavioral issues again that has responded well to IV Lorazepam. Palliative Care Consulted for [facility] and symptom management for her dementia.   Assessment & Plan:   Active Problems:   Hypertension   Severe sepsis (HCC)   Hypokalemia   Lactic acidosis   Hypotension   Acute metabolic encephalopathy  Severe Sepsis, Improved  -Lactic acid resolved with IV fluids, patient now afebrile, Still with slight leukocytosis but now normalized  -Unclear etiology as chest x-ray with no acute findings and patient with no respiratory symptoms but suspect it was from incomplete treatment of Right LE Cellulitis.  -UA unremarkable and urine and blood culture still pending.  -Concern for right lower extremity cellulitis however not very impressive on my exam but given patient was previously treated with doxycycline and severity of symptoms must rule out MRSA before de-escalating antibiotic coverage.  MRSA PCR negative  -Right ankle with erythema and swelling. XR rules out fracture but mentions soft tissue swelling, unsure how much this contributes -Continued broad spectrum Abx with vancomycin and cefepime given severe systemic symptoms and unclear etiology but will de-escalate to po Doxycycline and po Augmentin on 08/27/18 -IVF Maintenance D/C'd  -LA trended down from 4.26 and repeat lactic acid was 1.4 -WBC is trended down is normal from 12.3 and now 6.6  Hypotension, resolved. -Initially patient presented with somewhat normal blood pressure which quickly escalated to concern for hypertensive emergency with SBP of 267 and diastolic of 1 teens. Patient received enalaprilat IV x1 on 11/17 and subsequently developed blood pressure with SBP's in the 90s on admission.   -Resolved with Maintenance IV fluids and fluid bolus resuscitation. -BP is now  162/73  Hypertension, not at goal.   -After holding blood pressure medications due  to hypotension patient's blood pressure was now 162/73 this AM -Resumed home Amlodipine and will c/w IV hydralazine PRN. -Review of Records show she was taking Metoprolol Tartrate; Resumed at half the dose and start 25 mg po BID -Continue to monitor.  Judicious control to prevent another hypotensive episode as mentioned above  Acute metabolic/toxic encephalopathy superimposed on Chronic Dementia -Alert and oriented to person, place, time, context.  -Likely delirium in the setting of severe sepsis in patient with known dementia.  -Otherwise no acute neurologic findings. -C/w Delirium precautions -Continue Zyprexa 5 mg qHS -Likely has baseline dementia and had behavioral disturbances that started a few nights ago -Given Haldol 2 mg IV and had to be restrained on 11/21-11/22; Restraints discontinued -Will avoid a sitter in anticipation to D/C in AM to SNF -C/w Lorazepam 0.5 mg po q6hprn Anxiety and given 1 mg IV once for agitation this Afternoon -Palliative Care Consulted for Goals of Care and Symptom Management and I discussed with Dr. Hilma Favors who will see the patient and make recommendations   Hypokalemia, chronic -Potassium this AM was 3.2 -Takes oral potassium at home in the setting of Lasix use. Currently holding Lasix given previous hypotensive episodes during admission and because she was getting IVF Hydration; -D/C IVF and resumed Lasix the way she was taking it with 20 mg po MWF -Replete again with p.o. potassium chloride 40 mg twice daily x2 doses and IV potassium chloride 40 mEq x 1  -Continue monitor and replete as necessary -Repeat CMP in a.m.  Hypophosphatemia -Phos Level this AM was 3.7 -Continue to Monitor and Replete as Necessary -Repeat Phos Level in AM   Systolic Ejection Murmur.  -Possible aortic stenosis versus tricuspid regurg in the setting of pulmonary hypertension as evident by chest x-ray.  -Patient states she has a known murmur but no ECHO on filue -No  active need for echo currently will pursue if patient bacteremic or persists to have fever that can suggest possible endocarditis -Continue to monitor and will need outpatient Cardiac Workup but would not recommend definitive treatment given goals of care   Normocytic Anemia -Patient's hemoglobin/hematocrit is stable at 12.4/38.0 -Continue to monitor signs and symptoms of bleeding -Checked Anemia Panel showed an iron level 49, TIBC 183, TIBC of 232, saturation ratios of 21%, ferritin level 256, folate level 13.6, and vitamin B12 level of 848 -Repeat CBC in the a.m.  Hyperglycemia -Likely Reactive -Check HbA1c as an outpatient -CBG's on BMP's/CMP's ranging from 88-118 -Continue to Monitor   DVT prophylaxis: Enoxaparin 40 mg sq Daily Code Status: DO NOT RESUSCITATE Family Communication: No family present at bedside Disposition Plan: Remain Inpatient for current treatment and anticipate D/C back to SNF in AM after Palliative Care has evaluated   Consultants:   Palliative Care Medicine   Procedures:   None  Antimicrobials:  Anti-infectives (From admission, onward)   Start     Dose/Rate Route Frequency Ordered Stop   08/27/18 1200  doxycycline (VIBRA-TABS) tablet 100 mg     100 mg Oral Every 12 hours 08/27/18 1049     08/27/18 1200  amoxicillin-clavulanate (AUGMENTIN) 875-125 MG per tablet 1 tablet     1 tablet Oral Every 12 hours 08/27/18 1049     08/25/18 0600  vancomycin (VANCOCIN) IVPB 750 mg/150 ml premix  Status:  Discontinued     750 mg 150 mL/hr over 60 Minutes Intravenous Every 24 hours 08/24/18  1610 08/27/18 1049   08/24/18 1100  ceFEPIme (MAXIPIME) 1 g in sodium chloride 0.9 % 100 mL IVPB  Status:  Discontinued     1 g 200 mL/hr over 30 Minutes Intravenous Every 12 hours 08/24/18 0012 08/24/18 0138   08/24/18 0600  ceFEPIme (MAXIPIME) 1 g in sodium chloride 0.9 % 100 mL IVPB  Status:  Discontinued     1 g 200 mL/hr over 30 Minutes Intravenous Every 12 hours 08/24/18  0138 08/27/18 1049   08/24/18 0030  vancomycin (VANCOCIN) 1,500 mg in sodium chloride 0.9 % 500 mL IVPB     1,500 mg 250 mL/hr over 120 Minutes Intravenous  Once 08/24/18 0020 08/24/18 0414   08/23/18 2315  azithromycin (ZITHROMAX) 500 mg in sodium chloride 0.9 % 250 mL IVPB     500 mg 250 mL/hr over 60 Minutes Intravenous  Once 08/23/18 2301 08/24/18 0040   08/23/18 2145  cefTRIAXone (ROCEPHIN) 1 g in sodium chloride 0.9 % 100 mL IVPB     1 g 200 mL/hr over 30 Minutes Intravenous  Once 08/23/18 2140 08/23/18 2303     Subjective: Seen and examined and was awoken from sleep and was in no acute distress.  No chest pain, lightheadedness or dizziness.  Nursing states that she had a very rough night and was acting very belligerent and throwing things and was upset.  No chest pain, lightheadedness or dizziness.  States her leg is a little sore.  Objective: Vitals:   08/28/18 1042 08/28/18 1349 08/28/18 1600 08/29/18 0600  BP: 126/85 132/80  (!) 162/73  Pulse: 69 66  68  Resp: 16 16  16   Temp: 98.1 F (36.7 C) (!) 97.2 F (36.2 C) 98.1 F (36.7 C) (!) 97.3 F (36.3 C)  TempSrc: Oral Axillary Axillary Oral  SpO2: 96% 100%  96%  Weight:      Height:        Intake/Output Summary (Last 24 hours) at 08/29/2018 1401 Last data filed at 08/28/2018 1600 Gross per 24 hour  Intake 402.99 ml  Output -  Net 402.99 ml   Filed Weights   08/23/18 2139 08/24/18 0243  Weight: 70.3 kg 68.3 kg   Examination: Physical Exam:  Constitutional: Well-nourished, well-developed overweight Caucasian female currently no acute distress appears calm and comfortable and is woken up from sleep Eyes: Conjunctive are normal.  Sclera anicteric ENMT: External ears and nose appear normal.  Slightly hard of hearing Neck: Supple with no JVD Respiratory: Diminished to auscultation bilaterally no appreciable wheezing, rales, rhonchi.  Patient was not tachypneic wheezing excess muscle breathe Cardiovascular: Regular  rate and rhythm.  Has a loud systolic murmur.  Has mild lower extremity edema Abdomen: Soft, nontender, nondistended.  Bowel sounds present in 4 quadrants GU: Deferred Musculoskeletal: No contractures or cyanosis.  No joint deformities noted Skin: Skin is warm and dry no appreciable rashes or lesions limited skin evaluation but does have some mild erythema and slight warmth which is much improved from last few days and a skin lesion which is also improved on her right leg Neurologic: Cranial nerves II through XII grossly intact no appreciable focal deficits Psychiatric: Possibly demented but awoken from sleep in no acute distress.  She is awake now and alert.  Oriented x2  Data Reviewed: I have personally reviewed following labs and imaging studies  CBC: Recent Labs  Lab 08/25/18 0240 08/26/18 0622 08/27/18 0610 08/28/18 0628 08/29/18 0531  WBC 12.3* 7.5 6.8 7.5 6.6  NEUTROABS 7.6 4.3  3.6 3.5 3.2  HGB 10.2* 10.6* 10.6* 12.8 12.4  HCT 31.3* 32.1* 32.2* 38.6 38.0  MCV 90.2 87.5 88.7 87.5 87.4  PLT 169 225 226 296 607   Basic Metabolic Panel: Recent Labs  Lab 08/25/18 0240 08/26/18 0622 08/27/18 0610 08/28/18 0628 08/29/18 0531  NA 138 137 139 140 140  K 3.0* 2.8* 3.3* 3.2* 3.2*  CL 111 108 109 107 107  CO2 19* 21* 21* 22 24  GLUCOSE 88 98 92 118* 86  BUN 12 8 8 10 16   CREATININE 0.57 0.58 0.54 0.61 0.64  CALCIUM 7.8* 8.2* 8.5* 9.0 9.0  MG 2.0 1.9 2.0 1.8 2.0  PHOS  --   --  2.3* 3.5 3.7   GFR: Estimated Creatinine Clearance: 42.1 mL/min (by C-G formula based on SCr of 0.64 mg/dL). Liver Function Tests: Recent Labs  Lab 08/23/18 2138 08/27/18 0610 08/28/18 0628 08/29/18 0531  AST 21 17 23 23   ALT 13 14 17 17   ALKPHOS 40 38 51 47  BILITOT 0.5 0.7 0.5 0.6  PROT 6.0* 5.8* 6.7 6.3*  ALBUMIN 2.9* 2.6* 3.1* 2.9*   No results for input(s): LIPASE, AMYLASE in the last 168 hours. No results for input(s): AMMONIA in the last 168 hours. Coagulation Profile: No results  for input(s): INR, PROTIME in the last 168 hours. Cardiac Enzymes: No results for input(s): CKTOTAL, CKMB, CKMBINDEX, TROPONINI in the last 168 hours. BNP (last 3 results) No results for input(s): PROBNP in the last 8760 hours. HbA1C: No results for input(s): HGBA1C in the last 72 hours. CBG: No results for input(s): GLUCAP in the last 168 hours. Lipid Profile: No results for input(s): CHOL, HDL, LDLCALC, TRIG, CHOLHDL, LDLDIRECT in the last 72 hours. Thyroid Function Tests: No results for input(s): TSH, T4TOTAL, FREET4, T3FREE, THYROIDAB in the last 72 hours. Anemia Panel: Recent Labs    08/28/18 0628  VITAMINB12 848  FOLATE 13.6  FERRITIN 256  TIBC 232*  IRON 49  RETICCTPCT 1.2   Sepsis Labs: Recent Labs  Lab 08/23/18 2243 08/24/18 0044 08/24/18 0525 08/24/18 1611  LATICACIDVEN 2.95* 4.26* 2.9* 1.4    Recent Results (from the past 240 hour(s))  Urine culture     Status: None   Collection Time: 08/23/18  9:39 PM  Result Value Ref Range Status   Specimen Description   Final    URINE, CLEAN CATCH Performed at [facility], [place] [address], [place], [place] [address]    Special Requests   Final    NONE Performed at [facility], [place] [address], [place], [place] [address]    Culture   Final    NO GROWTH Performed at [facility], [place] [address], [place], [place] [address]    Report Status 08/25/2018 FINAL  Final  Blood Culture (routine x 2)     Status: None (Preliminary result)   Collection Time: 08/23/18  9:43 PM  Result Value Ref Range Status   Specimen Description   Final    BLOOD RIGHT HAND Performed at [place] [address] [place], [place] [address]    Special Requests   Final    BOTTLES DRAWN AEROBIC AND ANAEROBIC Blood Culture results may not be optimal due to an excessive volume of blood received in culture bottles Performed at [place] [address]  [place],  [address]    Culture   Final    NO GROWTH 4 DAYS Performed at [facility], [place] [address], [place], [place] [address]    Report Status PENDING  Incomplete  Blood Culture (routine x 2)     Status: None (Preliminary result)   Collection Time: 08/23/18  9:56 PM  Result Value Ref Range Status   Specimen Description BLOOD RIGHT FOREARM  Final   Special Requests   Final    BOTTLES DRAWN AEROBIC AND ANAEROBIC Blood Culture adequate volume   Culture   Final    NO GROWTH 4 DAYS Performed at [facility], [place] [address] [place], [place] [address]    Report Status PENDING  Incomplete  MRSA PCR Screening     Status: None   Collection Time: 08/24/18  2:45 AM  Result Value Ref Range Status   MRSA by PCR NEGATIVE NEGATIVE Final    Comment:        The GeneXpert MRSA Assay (FDA approved for NASAL specimens only), is one component of a comprehensive MRSA colonization surveillance program. It is not intended to diagnose MRSA infection nor to guide or monitor treatment for MRSA infections. Performed at [facility], [place] [address] [place], [place] [address]     Radiology Studies: No results found. Scheduled Meds: . acetaminophen  650 mg Oral QID  . amLODipine  10 mg Oral Daily  . amoxicillin-clavulanate  1 tablet Oral Q12H  . divalproex  250 mg Oral BID  . doxycycline  100 mg Oral Q12H  . enoxaparin (LOVENOX) injection  40 mg Subcutaneous Daily  . furosemide  20 mg Oral Q M,W,F  . LORazepam  1 mg Intravenous Once  . metoprolol tartrate  25 mg Oral BID  . OLANZapine  5 mg Oral QHS  . potassium chloride  40 mEq Oral BID  . traZODone  50 mg Oral QHS   Continuous Infusions: . sodium chloride 10 mL/hr at 08/24/18 0415  . potassium chloride 10 mEq (08/29/18 1400)    LOS: 5 days    Kerney Elbe, DO [facility] PAGER is on [place]  If 7PM-7AM, please contact night-coverage [email or web address] Password [email or web address] 08/29/2018, 2:01 PM

## 2018-08-30 LAB — COMPREHENSIVE METABOLIC PANEL
ALBUMIN: 2.9 g/dL — AB (ref 3.5–5.0)
ALK PHOS: 46 U/L (ref 38–126)
ALT: 22 U/L (ref 0–44)
ANION GAP: 8 (ref 5–15)
AST: 26 U/L (ref 15–41)
BILIRUBIN TOTAL: 0.5 mg/dL (ref 0.3–1.2)
BUN: 17 mg/dL (ref 8–23)
CALCIUM: 9.2 mg/dL (ref 8.9–10.3)
CO2: 22 mmol/L (ref 22–32)
CREATININE: 0.72 mg/dL (ref 0.44–1.00)
Chloride: 108 mmol/L (ref 98–111)
GFR calc Af Amer: >60 mL/min (ref >60)
GFR calc non Af Amer: >60 mL/min (ref >60)
Glucose, Bld: 89 mg/dL (ref 70–99)
Potassium: 3.9 mmol/L (ref 3.5–5.1)
Sodium: 138 mmol/L (ref 135–145)
TOTAL PROTEIN: 6.2 g/dL — AB (ref 6.5–8.1)

## 2018-08-30 LAB — CBC WITH DIFFERENTIAL/PLATELET
ABS IMMATURE GRANULOCYTES: 0.15 10*3/uL — AB (ref 0.00–0.07)
Basophils Absolute: 0.1 10*3/uL (ref 0.0–0.1)
Basophils Relative: 1 %
EOS PCT: 5 %
Eosinophils Absolute: 0.4 10*3/uL (ref 0.0–0.5)
HEMATOCRIT: 37.4 % (ref 36.0–46.0)
HEMOGLOBIN: 12.2 g/dL (ref 12.0–15.0)
Immature Granulocytes: 2 %
LYMPHS ABS: 2.7 10*3/uL (ref 0.7–4.0)
LYMPHS PCT: 34 %
MCH: 28.9 pg (ref 26.0–34.0)
MCHC: 32.6 g/dL (ref 30.0–36.0)
MCV: 88.6 fL (ref 80.0–100.0)
MONO ABS: 0.8 10*3/uL (ref 0.1–1.0)
Monocytes Relative: 11 %
Neutro Abs: 3.8 10*3/uL (ref 1.7–7.7)
Neutrophils Relative %: 47 %
Platelets: 332 10*3/uL (ref 150–400)
RBC: 4.22 MIL/uL (ref 3.87–5.11)
RDW: 14.1 % (ref 11.5–15.5)
WBC: 7.9 10*3/uL (ref 4.0–10.5)
nRBC: 0 % (ref 0.0–0.2)

## 2018-08-30 LAB — PHOSPHORUS: Phosphorus: 3.2 mg/dL (ref 2.5–4.6)

## 2018-08-30 LAB — MAGNESIUM: Magnesium: 1.8 mg/dL (ref 1.7–2.4)

## 2018-08-30 MED ORDER — AMOXICILLIN-POT CLAVULANATE 875-125 MG PO TABS: 1.0000 | ORAL_TABLET | Freq: Two times a day (BID) | ORAL | 0 refills | Status: DC

## 2018-08-30 NOTE — Care Management Note (Signed)
Case Management Note  Patient Details  Name: [name] MRN: [id number] Date of Birth: 03-19-1926  Subjective/Objective:   Sepsis, HTN, Hypokalemia, Acute Metabolic Encephalopathy                 Action/Plan: Contacted [name] to arrange [facility] with agency the facility uses, states they will arrange [facility] once the patient returns.   Expected Discharge Date:  08/30/18               Expected Discharge Plan:  Assisted Living / Rest Home  In-House Referral:  Clinical Social Work  Discharge planning Services  CM Consult  Post Acute Care Choice:  Home Health Choice offered to:  [facility] POA / Guardian  DME Arranged:  N/A DME Agency:  NA  HH Arranged:  RN [place] Agency:     Status of Service:  In process, will continue to follow  If discussed at Long Length of Stay Meetings, dates discussed:    Additional Comments:  Erenest Rasher, RN 08/30/2018, 1:27 PM

## 2018-08-30 NOTE — Discharge Summary (Signed)
Physician Discharge Summary  [name]:[id number] DOB: 07-13-1926 DOA: 08/23/2018  PCP: Crist Infante, MD  Admit date: 08/23/2018 Discharge date: 08/30/2018  Time spent: 45 minutes  Recommendations for Outpatient Follow-up:  Patient will be discharged to [facility] with palliative care to follow.  Patient will need to follow up with primary care provider within one week of discharge.  Patient should continue medications as prescribed.  Patient should follow a regular diet.   Discharge Diagnoses:  Severe sepsis secondary to right lower extremity cellulitis Essential hypertension Acute metabolic/toxic encephalopathy superimposed on chronic dementia Hypokalemia Hypophosphatemia Systolic ejection murmur Normocytic Anemia Hyperglycemia  Discharge Condition: Stable  Diet recommendation: regular  Filed Weights   08/23/18 2139 08/24/18 0243  Weight: 70.3 kg 68.3 kg    History of present illness:  On 08/24/2018 by Dr. Youlanda Roys SHERVON KERWIN is a 84 y.o. female  with dementia [facility] resident), hx of breast CA, GERD, HTN, TIA who presents with malaise and found to be febrile to 102 deg. History limited by dementia. Patient does not remember much detail other than that her back and R hip has been hurting for the past week at night. Denies dysuria, cough, or other respiratory symptoms; although patient had reported cough to ED previously. Patient's son Shanon Brow) states that patient's lower R leg had a "raw" spot 1 week ago with active purulent drainage. She had been started on doxycycline PO x 1 week for presumed cellulitis. Per EMS, the [facility] staff were unclear as to how long she had been febrile.  Hospital Course:  Severe sepsis secondary to right lower extremity cellulitis -Patient presented with fever, leukocytosis, elevated lactic acid and hypotension -Chest X-ray unremarkable -UA unremarkable for infection -blood and urine culture show no  growth -Patient was treated with vancomycin and cefepime, then transition to doxycycline and Augmentin (continue for additional 2 days) -Patient did have right ankle erythema with swelling, x-ray ruled out fracture but mentioned soft tissue swelling -Patient was also given IV fluid, her blood pressure did improve  Essential hypertension -Continue amlodipine, metoprolol  Acute metabolic/toxic encephalopathy superimposed on chronic dementia -Appears to be oriented to self at this time -No neurological deficits -Palliative care was consulted, may follow at assisted living facility  Hypokalemia -Chronic.  Patient uses oral potassium supplementation with Lasix at home -resolved  Hypophosphatemia -resolved  Systolic ejection murmur -Possibly aortic stenosis versus tricuspid regurgitation in the setting of pulmonary hypertension -No echocardiogram on file -Patient should have outpatient cardiac work-up  Normocytic Anemia -Hemoglobin currently 12.2, appears to be stable  Hyperglycemia -Appears to be stable -recommend outpatient   Code status: DNR  Procedures: none  Consultations: Palliative care  Discharge Exam: Vitals:   08/29/18 2042 08/30/18 0511  BP: (!) 154/73 (!) 155/65  Pulse: 69 62  Resp: 18 19  Temp: 98.1 F (36.7 C) 98.3 F (36.8 C)  SpO2: 96% 99%   No complaints this morning. With dementia.    General: Well developed, Elderly, NAD  HEENT: NCAT, mucous membranes moist.  Neck: Supple  Cardiovascular: S1 S2 auscultated, RRR, SEM  Respiratory: Clear to auscultation bilaterally with equal chest rise  Abdomen: Soft, nontender, nondistended, + bowel sounds  Extremities: warm dry without cyanosis clubbing. Trace LE edema, small abrasion on RLE  Neuro: AAOx1 (self), nonfocal  Psych: Normal affect and demeanor   Discharge Instructions  Allergies as of 08/30/2018      Reactions   Hydrocodone    See 12/24/17 ED note visual  hallucinations,  combativeness, and recurrent falling on hydrocodone    Ibandronate Sodium    REACTION: joint aches   Latex    unsure   Sulfonamide Derivatives Hives   Tessalon [benzonatate]    Fast heart rate, kept pt awake      Medication List    STOP taking these medications   amitriptyline 50 MG tablet Commonly known as:  ELAVIL   atenolol 50 MG tablet Commonly known as:  TENORMIN     TAKE these medications   acetaminophen 325 MG tablet Commonly known as:  TYLENOL Take 650 mg by mouth every 6 (six) hours as needed for mild pain.   amLODipine 10 MG tablet Commonly known as:  NORVASC Take 10 mg by mouth daily.   amoxicillin-clavulanate 875-125 MG tablet Commonly known as:  AUGMENTIN Take 1 tablet by mouth every 12 (twelve) hours.   divalproex 125 MG capsule Commonly known as:  DEPAKOTE SPRINKLE Take 250 mg by mouth 2 (two) times daily.   doxycycline 100 MG tablet Commonly known as:  VIBRA-TABS Take 100 mg by mouth 2 (two) times daily.   furosemide 40 MG tablet Commonly known as:  LASIX Take 1 tablet (40 mg total) by mouth daily. What changed:    how much to take  when to take this   LORazepam 0.5 MG tablet Commonly known as:  ATIVAN Take 0.5 mg by mouth every 6 (six) hours as needed for anxiety.   metoprolol tartrate 50 MG tablet Commonly known as:  LOPRESSOR Take 50 mg by mouth 2 (two) times daily.   OLANZapine 5 MG tablet Commonly known as:  ZYPREXA Take 5 mg by mouth at bedtime.   potassium chloride SA 20 MEQ tablet Commonly known as:  K-DUR,KLOR-CON Take 1 tablet (20 mEq total) by mouth daily.   psyllium 28 % packet Commonly known as:  METAMUCIL SMOOTH TEXTURE Take 1 packet by mouth 2 (two) times daily.   traZODone 50 MG tablet Commonly known as:  DESYREL Take 50 mg by mouth at bedtime.   Vitamin D3 50 MCG (2000 UT) Tabs Take 1,000 Units by mouth daily.      Allergies  Allergen Reactions  . Hydrocodone     See 12/24/17 ED note visual  hallucinations, combativeness, and recurrent falling on hydrocodone   . Ibandronate Sodium     REACTION: joint aches  . Latex     unsure  . Sulfonamide Derivatives Hives  . Tessalon [Benzonatate]     Fast heart rate, kept pt awake   Follow-up Information    Crist Infante, MD. Schedule an appointment as soon as possible for a visit in 1 week(s).   Specialty:  Internal Medicine Why:  Hospital follow up Contact information: [address] [place] [address] [phone]            The results of significant diagnostics from this hospitalization (including imaging, microbiology, ancillary and laboratory) are listed below for reference.    Significant Diagnostic Studies: Dg Chest 2 View  Result Date: 08/23/2018 CLINICAL DATA:  Fever EXAM: CHEST - 2 VIEW COMPARISON:  07/04/2018.  Chest CT 10/17/2015 FINDINGS: Cardiomegaly with vascular congestion. Prominent hila bilaterally. When comparing to prior CT, this likely is related to pulmonary arterial hypertension as seen on prior CT. No confluent opacities or effusions. No acute bony abnormality. IMPRESSION: Cardiomegaly. Bilateral hilar fullness likely related to pulmonary arterial hypertension seen on prior chest CT. Low volumes.  No active disease. Electronically Signed   By: Rolm Baptise  M.D.   On: 08/23/2018 22:14   Dg Ankle 2 Views Right  Result Date: 08/25/2018 CLINICAL DATA:  Dementia, sepsis, lower leg cellulitis, RIGHT ankle pain EXAM: RIGHT ANKLE - 2 VIEW COMPARISON:  None FINDINGS: Soft tissue swelling RIGHT ankle. Significant artifacts from clothing. Joint spaces preserved. Bones demineralized. No acute fracture, dislocation, or bone destruction. Plantar and Achilles insertion calcaneal spur formation. IMPRESSION: Soft tissue swelling and calcaneal spur formation without acute bony abnormalities. Electronically Signed   By: Lavonia Dana M.D.   On: 08/25/2018 10:56    Microbiology: Recent Results (from the past 240  hour(s))  Urine culture     Status: None   Collection Time: 08/23/18  9:39 PM  Result Value Ref Range Status   Specimen Description   Final    URINE, CLEAN CATCH Performed at [facility] [address], [place], [place] [address]    Special Requests   Final    NONE Performed at [facility], [place] [address] [place], [place] [address]    Culture   Final    NO GROWTH Performed at [facility], [place] [address] [place], [place] [address]    Report Status 08/25/2018 FINAL  Final  Blood Culture (routine x 2)     Status: None   Collection Time: 08/23/18  9:43 PM  Result Value Ref Range Status   Specimen Description   Final    BLOOD RIGHT HAND Performed at [facility] [address] [place], [place] [address]    Special Requests   Final    BOTTLES DRAWN AEROBIC AND ANAEROBIC Blood Culture results may not be optimal due to an excessive volume of blood received in culture bottles Performed at [facility] [address] [place], [place] [address]    Culture   Final    NO GROWTH 5 DAYS Performed at [facility], [place] [address] [place], [place] [address]    Report Status 08/29/2018 FINAL  Final  Blood Culture (routine x 2)     Status: None   Collection Time: 08/23/18  9:56 PM  Result Value Ref Range Status   Specimen Description BLOOD RIGHT FOREARM  Final   Special Requests   Final    BOTTLES DRAWN AEROBIC AND ANAEROBIC Blood Culture adequate volume   Culture   Final    NO GROWTH 5 DAYS Performed at [facility], [place] [address], [place], [place] [address]    Report Status 08/29/2018 FINAL  Final  MRSA PCR Screening     Status: None   Collection Time: 08/24/18  2:45 AM  Result Value Ref Range Status   MRSA by PCR NEGATIVE NEGATIVE Final    Comment:        The GeneXpert MRSA Assay (FDA approved for NASAL specimens only), is one component of a comprehensive MRSA  colonization surveillance program. It is not intended to diagnose MRSA infection nor to guide or monitor treatment for MRSA infections. Performed at [facility], [place] [address] [place], [place] [address]      Labs: Basic Metabolic Panel: Recent Labs  Lab 08/26/18 0622 08/27/18 0610 08/28/18 0628 08/29/18 0531 08/30/18 0546  NA 137 139 140 140 138  K 2.8* 3.3* 3.2* 3.2* 3.9  CL 108 109 107 107 108  CO2 21* 21* 22 24 22   GLUCOSE 98 92 118* 86 89  BUN 8 8 10 16 17   CREATININE 0.58 0.54 0.61  0.64 0.72  CALCIUM 8.2* 8.5* 9.0 9.0 9.2  MG 1.9 2.0 1.8 2.0 1.8  PHOS  --  2.3* 3.5 3.7 3.2   Liver Function Tests: Recent Labs  Lab 08/23/18 2138 08/27/18 0610 08/28/18 0628 08/29/18 0531 08/30/18 0546  AST 21 17 23 23 26   ALT 13 14 17 17 22   ALKPHOS 40 38 51 47 46  BILITOT 0.5 0.7 0.5 0.6 0.5  PROT 6.0* 5.8* 6.7 6.3* 6.2*  ALBUMIN 2.9* 2.6* 3.1* 2.9* 2.9*   No results for input(s): LIPASE, AMYLASE in the last 168 hours. No results for input(s): AMMONIA in the last 168 hours. CBC: Recent Labs  Lab 08/26/18 0622 08/27/18 0610 08/28/18 0628 08/29/18 0531 08/30/18 0546  WBC 7.5 6.8 7.5 6.6 7.9  NEUTROABS 4.3 3.6 3.5 3.2 3.8  HGB 10.6* 10.6* 12.8 12.4 12.2  HCT 32.1* 32.2* 38.6 38.0 37.4  MCV 87.5 88.7 87.5 87.4 88.6  PLT 225 226 296 311 332   Cardiac Enzymes: No results for input(s): CKTOTAL, CKMB, CKMBINDEX, TROPONINI in the last 168 hours. BNP: BNP (last 3 results) No results for input(s): BNP in the last 8760 hours.  ProBNP (last 3 results) No results for input(s): PROBNP in the last 8760 hours.  CBG: No results for input(s): GLUCAP in the last 168 hours.     Signed:  Cristal Ford  [facility] 08/30/2018, 12:29 PM

## 2018-08-30 NOTE — NC FL2 (Addendum)
[place] MEDICAID FL2 LEVEL OF CARE SCREENING TOOL     IDENTIFICATION  Patient Name: [name] Birthdate: [birth year] Sex: female Admission Date (Current Location): 08/23/2018  [facility] and [place] Number:  Herbalist and Address:  [facility],  [place], [place]      Provider Number: [id number]  Attending Physician Name and Address:  Cristal Ford, DO  Relative Name and Phone Number:  Breah Joa: [phone]    Current Level of Care: Hospital Recommended Level of Care: [place], Memory Care Prior Approval Number:    Date Approved/Denied:   PASRR Number: [id number]  Discharge Plan: Other (Comment)(Memory Care)    Current Diagnoses: Patient Active Problem List   Diagnosis Date Noted  . Severe sepsis ([place]) 08/24/2018  . Hypokalemia 08/24/2018  . Lactic acidosis 08/24/2018  . Hypotension 08/24/2018  . Acute metabolic encephalopathy 74/25/9563  . Aggressive behavior of adult 07/04/2018  . Adverse drug reaction 12/25/2017  . Closed pelvic fracture ([place]) 12/18/2017  . Closed fracture of multiple pubic rami ([place]) 12/18/2017  . CKD (chronic kidney disease) stage 3, GFR 30-59 ml/min (HCC) 12/18/2017  . Internal hemorrhoids with complication with suspected prolapse 02/16/2014  . Cancer of upper-outer quadrant of female breast ([place]) 06/16/2012  . Hemorrhoids, internal, thrombosed, Left lateral 02/10/2012  . CONSTIPATION 04/18/2008  . HYPERLIPIDEMIA 04/15/2008  . Hypertension 04/15/2008  . TRANSIENT ISCHEMIC ATTACK 04/15/2008  . ESOPHAGEAL STRICTURE 04/15/2008  . GERD 04/15/2008  . ARTHRITIS 04/15/2008    Orientation RESPIRATION BLADDER Height & Weight     Self  Normal External catheter Weight: 150 lb 9.2 oz (68.3 kg) Height:  5\' 5"  (165.1 cm)  BEHAVIORAL SYMPTOMS/MOOD NEUROLOGICAL BOWEL NUTRITION STATUS      Continent Diet(Regular)  AMBULATORY STATUS COMMUNICATION OF NEEDS Skin   Limited Assist  Verbally Normal                       Personal Care Assistance Level of Assistance  Bathing, Feeding, Dressing Bathing Assistance: Maximum assistance Feeding assistance: Limited assistance Dressing Assistance: Limited assistance     Functional Limitations Info  Sight, Hearing, Speech Sight Info: Adequate Hearing Info: Adequate Speech Info: Adequate    SPECIAL CARE FACTORS FREQUENCY  PT (By licensed PT), OT (By licensed OT)     PT Frequency: 3x/week OT Frequency: 3x/week    Home Health        Contractures Contractures Info: Not present    Additional Factors Info  Code Status, Allergies, Psychotropic Code Status Info: DNR Allergies Info: HYDROCODONE, IBANDRONATE SODIUM, LATEX, SULFONAMIDE DERIVATIVES, TESSALON BENZONATATE  Psychotropic Info: Ativan         Current Medications (08/30/2018):  This is the current hospital active medication list Current Facility-Administered Medications  Medication Dose Route Frequency Provider Last Rate Last Dose  . 0.9 %  sodium chloride infusion   Intravenous PRN Oretha Milch D, MD 10 mL/hr at 08/24/18 0415    . acetaminophen (TYLENOL) tablet 650 mg  650 mg Oral QID Lane Hacker L, DO   650 mg at 08/30/18 1111  . amLODipine (NORVASC) tablet 10 mg  10 mg Oral Daily Oretha Milch D, MD   10 mg at 08/30/18 1111  . amoxicillin-clavulanate (AUGMENTIN) 875-125 MG per tablet 1 tablet  1 tablet Oral Q12H Raiford Noble [place], DO   1 tablet at 08/30/18 1111  . divalproex (DEPAKOTE SPRINKLE) capsule 250 mg  250 mg Oral BID Desiree Hane, MD  250 mg at 08/30/18 1111  . doxycycline (VIBRA-TABS) tablet 100 mg  100 mg Oral Q12H Sheikh, Georgina Quint [place], DO   100 mg at 08/30/18 1111  . enoxaparin (LOVENOX) injection 40 mg  40 mg Subcutaneous Daily Shade, Haze Justin, RPH   40 mg at 08/30/18 1112  . furosemide (LASIX) tablet 20 mg  20 mg Oral Q M,W,F Sheikh, Omair Latif, DO   20 mg at 08/28/18 1544  . hydrALAZINE (APRESOLINE) injection 5-10  mg  5-10 mg Intravenous Q6H PRN Oretha Milch D, MD      . LORazepam (ATIVAN) injection 1 mg  1 mg Intravenous Once Blount, Scarlette Shorts T, NP      . LORazepam (ATIVAN) tablet 0.5 mg  0.5 mg Oral Q6H PRN Oretha Milch D, MD   0.5 mg at 08/28/18 0242  . metoprolol tartrate (LOPRESSOR) tablet 25 mg  25 mg Oral BID Raiford Noble [place], DO   25 mg at 08/30/18 1112  . OLANZapine (ZYPREXA) tablet 5 mg  5 mg Oral QHS Oretha Milch D, MD   5 mg at 08/29/18 2024  . polyethylene glycol (MIRALAX / GLYCOLAX) packet 17 g  17 g Oral Daily PRN Oretha Milch D, MD      . traZODone (DESYREL) tablet 50 mg  50 mg Oral QHS Oretha Milch D, MD   50 mg at 08/29/18 2017     Discharge Medications: acetaminophen 325 MG tablet Commonly known as:  TYLENOL Take 650 mg by mouth every 6 (six) hours as needed for mild pain.   amLODipine 10 MG tablet Commonly known as:  NORVASC Take 10 mg by mouth daily.   amoxicillin-clavulanate 875-125 MG tablet Commonly known as:  AUGMENTIN Take 1 tablet by mouth every 12 (twelve) hours.   divalproex 125 MG capsule Commonly known as:  DEPAKOTE SPRINKLE Take 250 mg by mouth 2 (two) times daily.   doxycycline 100 MG tablet Commonly known as:  VIBRA-TABS Take 100 mg by mouth 2 (two) times daily.   furosemide 40 MG tablet Commonly known as:  LASIX Take 1 tablet (40 mg total) by mouth daily. What changed:    how much to take  when to take this   LORazepam 0.5 MG tablet Commonly known as:  ATIVAN Take 0.5 mg by mouth every 6 (six) hours as needed for anxiety.   metoprolol tartrate 50 MG tablet Commonly known as:  LOPRESSOR Take 50 mg by mouth 2 (two) times daily.   OLANZapine 5 MG tablet Commonly known as:  ZYPREXA Take 5 mg by mouth at bedtime.   potassium chloride SA 20 MEQ tablet Commonly known as:  K-DUR,KLOR-CON Take 1 tablet (20 mEq total) by mouth daily.   psyllium 28 % packet Commonly known as:  METAMUCIL SMOOTH TEXTURE Take 1 packet by mouth 2  (two) times daily.   traZODone 50 MG tablet Commonly known as:  DESYREL Take 50 mg by mouth at bedtime.   Vitamin D3 50 MCG (2000 UT) Tabs Take 1,000 Units by mouth daily.     Relevant Imaging Results:  Relevant Lab Results:   Additional Information SSN: [id number]  Pricilla Holm, [place]

## 2018-08-30 NOTE — Progress Notes (Signed)
Patient will be discharging back to [facility]. Facility is unable to accept patient back today due to director needing to approve FL2.  Director will be in Monday morning and patient can d/c then.  CSW will update son, Shanon Brow, and prepare for d/c Monday morning.   Pricilla Holm, MSW, [place] Social Work [phone]

## 2018-08-31 DIAGNOSIS — L03115 Cellulitis of right lower limb: Secondary | ICD-10-CM | POA: Diagnosis not present

## 2018-08-31 DIAGNOSIS — Z760 Encounter for issue of repeat prescription: Secondary | ICD-10-CM | POA: Diagnosis not present

## 2018-08-31 DIAGNOSIS — D849 Immunodeficiency, unspecified: Secondary | ICD-10-CM | POA: Diagnosis not present

## 2018-08-31 DIAGNOSIS — F0391 Unspecified dementia with behavioral disturbance: Secondary | ICD-10-CM | POA: Diagnosis not present

## 2018-08-31 DIAGNOSIS — Z8673 Personal history of transient ischemic attack (TIA), and cerebral infarction without residual deficits: Secondary | ICD-10-CM | POA: Diagnosis not present

## 2018-08-31 LAB — CREATININE, SERUM
Creatinine, Ser: 0.68 mg/dL (ref 0.44–1.00)
GFR calc non Af Amer: >60 mL/min (ref >60)

## 2018-08-31 NOTE — Progress Notes (Signed)
Patient seen and examined today. Stable for discharge. Please see discharge summary done on 08/30/2018. No changes to instructions or medications.   Danta Baumgardner D.O. [facility] Pager [phone]  If 7PM-7AM, please contact night-coverage [email or web address] Password [facility] 08/31/2018, 9:35 AM

## 2018-08-31 NOTE — Progress Notes (Signed)
Attempted to call CSW and left voicemail about if pt is ok to leave at this time. Pts son is here and is planning to transport pt himself with help of his son. Reached out to [facility] to try to find an answer about if pt can leave now.

## 2018-08-31 NOTE — Care Management Note (Signed)
Case Management Note  Patient Details  Name: [name] MRN: [id number] Date of Birth: Apr 22, 1926  Subjective/Objective:                  discharged  Action/Plan: Discharged to home with self-care and family support Orders checked for hhc/No CM needs present at time of discharge.  Expected Discharge Date:  08/30/18               Expected Discharge Plan:  Assisted Living / Rest Home  In-House Referral:  Clinical Social Work  Discharge planning Services  CM Consult  Post Acute Care Choice:  Home Health Choice offered to:  [facility] POA / Guardian  DME Arranged:  N/A DME Agency:  NA  HH Arranged:  RN [place] Agency:     Status of Service:  Completed, signed off  If discussed at [employer] of Stay Meetings, dates discussed:    Additional Comments:  Leeroy Cha, RN 08/31/2018, 10:05 AM

## 2018-08-31 NOTE — Progress Notes (Signed)
Pt returning to [facility] at [place] today.  Spoke with son (via phone) who is at hospital to take pt back to facility. CSW is awaiting return call from facility to confirm they are prepared for pt's arrival back this morning. Pt son states he has spoken with representative at [facility] who stated, "We are ready for her" therefore he and pt's husband are taking pt to transport now. He was aware CSW has not received word from facility yet but states they feel comfortable taking pt now.  Sharren Bridge, MSW, LCSW Clinical Social Work 08/31/2018 [phone] coverage for [phone]

## 2018-09-14 DIAGNOSIS — R21 Rash and other nonspecific skin eruption: Secondary | ICD-10-CM | POA: Diagnosis not present

## 2018-09-14 DIAGNOSIS — D849 Immunodeficiency, unspecified: Secondary | ICD-10-CM | POA: Diagnosis not present

## 2018-09-14 DIAGNOSIS — Z8673 Personal history of transient ischemic attack (TIA), and cerebral infarction without residual deficits: Secondary | ICD-10-CM | POA: Diagnosis not present

## 2018-09-14 DIAGNOSIS — M25552 Pain in left hip: Secondary | ICD-10-CM | POA: Diagnosis not present

## 2018-09-14 DIAGNOSIS — F0391 Unspecified dementia with behavioral disturbance: Secondary | ICD-10-CM | POA: Diagnosis not present

## 2018-09-15 DIAGNOSIS — M25552 Pain in left hip: Secondary | ICD-10-CM | POA: Diagnosis not present

## 2018-09-21 DIAGNOSIS — F0391 Unspecified dementia with behavioral disturbance: Secondary | ICD-10-CM | POA: Diagnosis not present

## 2018-09-21 DIAGNOSIS — D72825 Bandemia: Secondary | ICD-10-CM | POA: Diagnosis not present

## 2018-09-21 DIAGNOSIS — Z8673 Personal history of transient ischemic attack (TIA), and cerebral infarction without residual deficits: Secondary | ICD-10-CM | POA: Diagnosis not present

## 2018-09-21 DIAGNOSIS — R102 Pelvic and perineal pain: Secondary | ICD-10-CM | POA: Diagnosis not present

## 2018-09-28 ENCOUNTER — Encounter (HOSPITAL_COMMUNITY): Payer: Self-pay

## 2018-09-28 ENCOUNTER — Emergency Department (HOSPITAL_COMMUNITY): Payer: PPO

## 2018-09-28 ENCOUNTER — Emergency Department (HOSPITAL_COMMUNITY)
Admission: EM | Admit: 2018-09-28 | Discharge: 2018-09-28 | Disposition: A | Discharge status: Home / Self Care | Payer: PPO | Attending: Emergency Medicine | Admitting: Emergency Medicine

## 2018-09-28 DIAGNOSIS — Z9012 Acquired absence of left breast and nipple: Secondary | ICD-10-CM | POA: Diagnosis not present

## 2018-09-28 DIAGNOSIS — Z79899 Other long term (current) drug therapy: Secondary | ICD-10-CM | POA: Diagnosis not present

## 2018-09-28 DIAGNOSIS — M79604 Pain in right leg: Secondary | ICD-10-CM | POA: Diagnosis not present

## 2018-09-28 DIAGNOSIS — Z853 Personal history of malignant neoplasm of breast: Secondary | ICD-10-CM | POA: Insufficient documentation

## 2018-09-28 DIAGNOSIS — M79651 Pain in right thigh: Secondary | ICD-10-CM | POA: Diagnosis not present

## 2018-09-28 DIAGNOSIS — M79661 Pain in right lower leg: Secondary | ICD-10-CM | POA: Diagnosis not present

## 2018-09-28 DIAGNOSIS — S32511A Fracture of superior rim of right pubis, initial encounter for closed fracture: Secondary | ICD-10-CM | POA: Diagnosis not present

## 2018-09-28 DIAGNOSIS — W010XXA Fall on same level from slipping, tripping and stumbling without subsequent striking against object, initial encounter: Secondary | ICD-10-CM | POA: Insufficient documentation

## 2018-09-28 DIAGNOSIS — W19XXXA Unspecified fall, initial encounter: Secondary | ICD-10-CM

## 2018-09-28 DIAGNOSIS — N183 Chronic kidney disease, stage 3 (moderate): Secondary | ICD-10-CM | POA: Insufficient documentation

## 2018-09-28 DIAGNOSIS — Z8673 Personal history of transient ischemic attack (TIA), and cerebral infarction without residual deficits: Secondary | ICD-10-CM | POA: Insufficient documentation

## 2018-09-28 DIAGNOSIS — F0390 Unspecified dementia without behavioral disturbance: Secondary | ICD-10-CM | POA: Insufficient documentation

## 2018-09-28 DIAGNOSIS — R410 Disorientation, unspecified: Secondary | ICD-10-CM | POA: Diagnosis not present

## 2018-09-28 DIAGNOSIS — I10 Essential (primary) hypertension: Secondary | ICD-10-CM | POA: Diagnosis not present

## 2018-09-28 DIAGNOSIS — M25551 Pain in right hip: Secondary | ICD-10-CM | POA: Insufficient documentation

## 2018-09-28 DIAGNOSIS — R404 Transient alteration of awareness: Secondary | ICD-10-CM | POA: Diagnosis not present

## 2018-09-28 DIAGNOSIS — I129 Hypertensive chronic kidney disease with stage 1 through stage 4 chronic kidney disease, or unspecified chronic kidney disease: Secondary | ICD-10-CM | POA: Diagnosis not present

## 2018-09-28 DIAGNOSIS — S79921A Unspecified injury of right thigh, initial encounter: Secondary | ICD-10-CM | POA: Diagnosis not present

## 2018-09-28 NOTE — ED Notes (Signed)
Bed: WA01 Expected date:  Expected time:  Means of arrival:  Comments: EMS foot pain

## 2018-09-28 NOTE — ED Triage Notes (Signed)
Patient BIB [facility] from [facility] with history of dementia. Patient got up from bed this morning and fell - staff was in room at the time but was turned around. Staff reports patient did not lose consciousness. Patient complaining of "soreness" to right thigh and "knot" to right forehead. Patient denies headache/dizziness. EMS VS: CBG=81, HR 55, RR 16, O2 100%, BP 163/77. Patient is not on blood thinners. Patient has golden ticket DNR at bedside.

## 2018-09-28 NOTE — ED Notes (Signed)
[facility] called for transport.  

## 2018-09-28 NOTE — ED Provider Notes (Addendum)
[facility] DEPT Provider Note   CSN: [id number] Arrival date & time: 09/28/18  0909     History   Chief Complaint Chief Complaint  Patient presents with  . Fall    HPI [name] is a 84 y.o. female.  84 year old female with history of dementia had a witnessed fall just prior to arrival.  Struck her head but no LOC.  Complains of pain to her distal right femur.  Denies any head or neck discomfort.  She is alert and oriented x3.  Denies any knee discomfort.  Mild hip pain.  EMS called and patient transported here.     Past Medical History:  Diagnosis Date  . Anxiety   . Arthritis   . Breast cancer ([place])    left  . Cough   . Esophageal stricture   . GERD (gastroesophageal reflux disease)   . Hyperlipidemia   . Hypertension   . Thrombosed external hemorrhoid, R posterior 01/20/2012  . TIA (transient ischemic attack)     Patient Active Problem List   Diagnosis Date Noted  . Severe sepsis ([place]) 08/24/2018  . Hypokalemia 08/24/2018  . Lactic acidosis 08/24/2018  . Hypotension 08/24/2018  . Acute metabolic encephalopathy 41/66/0630  . Aggressive behavior of adult 07/04/2018  . Adverse drug reaction 12/25/2017  . Closed pelvic fracture ([place]) 12/18/2017  . Closed fracture of multiple pubic rami ([place]) 12/18/2017  . CKD (chronic kidney disease) stage 3, GFR 30-59 ml/min ([facility]) 12/18/2017  . Internal hemorrhoids with complication with suspected prolapse 02/16/2014  . Cancer of upper-outer quadrant of female breast ([place]) 06/16/2012  . Hemorrhoids, internal, thrombosed, Left lateral 02/10/2012  . CONSTIPATION 04/18/2008  . HYPERLIPIDEMIA 04/15/2008  . Hypertension 04/15/2008  . TRANSIENT ISCHEMIC ATTACK 04/15/2008  . ESOPHAGEAL STRICTURE 04/15/2008  . GERD 04/15/2008  . ARTHRITIS 04/15/2008    Past Surgical History:  Procedure Laterality Date  . ABDOMINAL HYSTERECTOMY    . BREAST LUMPECTOMY  2010   left  . CHOLECYSTECTOMY  2002    . CYSTECTOMY  in her 89's  . HEEL SPUR SURGERY     right  . MASTECTOMY, PARTIAL     left  . TONSILLECTOMY       OB History   No obstetric history on file.      Home Medications    Prior to Admission medications   Medication Sig Start Date End Date Taking? Authorizing Provider  acetaminophen (TYLENOL) 325 MG tablet Take 650 mg by mouth every 6 (six) hours as needed for mild pain.    [provider]  amLODipine (NORVASC) 10 MG tablet Take 10 mg by mouth daily.    [provider]  amoxicillin-clavulanate (AUGMENTIN) 875-125 MG tablet Take 1 tablet by mouth every 12 (twelve) hours. 08/30/18   Mikhail, Velta Addison, DO  Cholecalciferol (VITAMIN D3) 2000 UNITS TABS Take 1,000 Units by mouth daily.     [provider]  divalproex (DEPAKOTE SPRINKLE) 125 MG capsule Take 250 mg by mouth 2 (two) times daily.    [provider]  doxycycline (VIBRA-TABS) 100 MG tablet Take 100 mg by mouth 2 (two) times daily.    [provider]  furosemide (LASIX) 40 MG tablet Take 1 tablet (40 mg total) by mouth daily. Patient taking differently: Take 20 mg by mouth every Monday, Wednesday, and Friday.  01/09/18   Medina-Vargas, Monina C, NP  LORazepam (ATIVAN) 0.5 MG tablet Take 0.5 mg by mouth every 6 (six) hours as needed for anxiety.  [provider]  metoprolol tartrate (LOPRESSOR) 50 MG tablet Take 50 mg by mouth 2 (two) times daily.    [provider]  OLANZapine (ZYPREXA) 5 MG tablet Take 5 mg by mouth at bedtime.    [provider]  potassium chloride SA (K-DUR,KLOR-CON) 20 MEQ tablet Take 1 tablet (20 mEq total) by mouth daily. 01/09/18   Medina-Vargas, Monina C, NP  psyllium (METAMUCIL SMOOTH TEXTURE) 28 % packet Take 1 packet by mouth 2 (two) times daily. 02/16/14   Stark Klein, MD  traZODone (DESYREL) 50 MG tablet Take 50 mg by mouth at bedtime.    [provider]    Family History Family History  Problem Relation Age  of Onset  . Lung cancer Brother   . Cervical cancer Sister   . Hypertension Father   . Colon cancer Neg Hx   . Esophageal cancer Neg Hx   . Rectal cancer Neg Hx   . Stomach cancer Neg Hx     Social History Social History   Tobacco Use  . Smoking status: Never Smoker  . Smokeless tobacco: Never Used  Substance Use Topics  . Alcohol use: No  . Drug use: No     Allergies   Hydrocodone; Ibandronate sodium; Latex; Sulfonamide derivatives; and Tessalon [benzonatate]   Review of Systems Review of Systems  Unable to perform ROS: Dementia     Physical Exam Updated Vital Signs BP (!) 163/77   Pulse (!) 55   Temp 97.7 F (36.5 C) (Oral)   Resp 14   SpO2 99%   Physical Exam Vitals signs and nursing note reviewed.  Constitutional:      General: She is not in acute distress.    Appearance: Normal appearance. She is well-developed. She is not toxic-appearing.  HENT:     Head: Normocephalic and atraumatic.   Eyes:     General: Lids are normal.     Conjunctiva/sclera: Conjunctivae normal.     Pupils: Pupils are equal, round, and reactive to light.  Neck:     Musculoskeletal: Normal range of motion and neck supple.     Thyroid: No thyroid mass.     Trachea: No tracheal deviation.  Cardiovascular:     Rate and Rhythm: Normal rate and regular rhythm.     Heart sounds: Normal heart sounds. No murmur. No gallop.   Pulmonary:     Effort: Pulmonary effort is normal. No respiratory distress.     Breath sounds: Normal breath sounds. No stridor. No decreased breath sounds, wheezing, rhonchi or rales.  Abdominal:     General: Bowel sounds are normal. There is no distension.     Palpations: Abdomen is soft.     Tenderness: There is no abdominal tenderness. There is no rebound.  Musculoskeletal: Normal range of motion.        General: No tenderness.       Legs:  Skin:    General: Skin is warm and dry.     Findings: No abrasion or rash.  Neurological:     Mental Status:  She is alert. Mental status is at baseline.     GCS: GCS eye subscore is 4. GCS verbal subscore is 5. GCS motor subscore is 6.     Cranial Nerves: No cranial nerve deficit.     Sensory: No sensory deficit.  Psychiatric:        Attention and Perception: She is attentive.        Speech: Speech normal.  Behavior: Behavior normal.      ED Treatments / Results  Labs (all labs ordered are listed, but only abnormal results are displayed) Labs Reviewed - No data to display  EKG None  Radiology Dg Hip Unilat  With Pelvis 2-3 Views Right  Result Date: 09/28/2018 CLINICAL DATA:  Pain following fall EXAM: DG HIP (WITH OR WITHOUT PELVIS) 2-3V RIGHT COMPARISON:  December 24, 2017 FINDINGS: Frontal pelvis as well as frontal and lateral right hip images were obtained. There has been remodeling at sites of prior fractures of the right ischium and superior pubic ramus. There is also evidence of an old fracture of the left superior pubic ramus, stable. No acute fracture or dislocation is demonstrated. There is moderate symmetric narrowing of each hip joint. No erosive change. IMPRESSION: Prior fractures of each superior pubic ramus and ischium with remodeling. No acute fracture or dislocation. Moderate symmetric narrowing of each hip joint. Electronically Signed   By: Lowella Grip III M.D.   On: 09/28/2018 10:29    Procedures Procedures (including critical care time)  Medications Ordered in ED Medications - No data to display   Initial Impression / Assessment and Plan / ED Course  I have reviewed the triage vital signs and the nursing notes.  Pertinent labs & imaging results that were available during my care of the patient were reviewed by me and considered in my medical decision making (see chart for details).     X-ray of right hip and right femur negative.  Patient nontender with range of motion at her right hip.  Low suspicion for any occult hip fracture.  She is at her baseline at  this time will be discharged home.  Final Clinical Impressions(s) / ED Diagnoses   Final diagnoses:  None    ED Discharge Orders    None       Lacretia Leigh, MD 09/28/18 1239    Lacretia Leigh, MD 09/28/18 1241

## 2018-09-28 NOTE — Discharge Instructions (Addendum)
Use Tylenol and/or Motrin as needed for pain.  X-ray of the right hip and right thigh were negative for fracture today

## 2018-10-05 DIAGNOSIS — F0391 Unspecified dementia with behavioral disturbance: Secondary | ICD-10-CM | POA: Diagnosis not present

## 2018-10-05 DIAGNOSIS — Z8673 Personal history of transient ischemic attack (TIA), and cerebral infarction without residual deficits: Secondary | ICD-10-CM | POA: Diagnosis not present

## 2018-10-05 DIAGNOSIS — Z9181 History of falling: Secondary | ICD-10-CM | POA: Diagnosis not present

## 2018-10-05 DIAGNOSIS — M25551 Pain in right hip: Secondary | ICD-10-CM | POA: Diagnosis not present

## 2018-10-05 DIAGNOSIS — Z760 Encounter for issue of repeat prescription: Secondary | ICD-10-CM | POA: Diagnosis not present

## 2018-10-26 DIAGNOSIS — M6281 Muscle weakness (generalized): Secondary | ICD-10-CM | POA: Diagnosis not present

## 2018-10-26 DIAGNOSIS — M25551 Pain in right hip: Secondary | ICD-10-CM | POA: Diagnosis not present

## 2018-10-26 DIAGNOSIS — Z8673 Personal history of transient ischemic attack (TIA), and cerebral infarction without residual deficits: Secondary | ICD-10-CM | POA: Diagnosis not present

## 2018-10-26 DIAGNOSIS — R296 Repeated falls: Secondary | ICD-10-CM | POA: Diagnosis not present

## 2018-10-26 DIAGNOSIS — F0391 Unspecified dementia with behavioral disturbance: Secondary | ICD-10-CM | POA: Diagnosis not present

## 2018-10-28 ENCOUNTER — Emergency Department (HOSPITAL_COMMUNITY)
Admission: EM | Admit: 2018-10-28 | Discharge: 2018-10-28 | Disposition: A | Discharge status: Skilled Nursing Facility | Payer: PPO | Attending: Emergency Medicine | Admitting: Emergency Medicine

## 2018-10-28 ENCOUNTER — Emergency Department (HOSPITAL_COMMUNITY): Payer: PPO

## 2018-10-28 ENCOUNTER — Other Ambulatory Visit: Payer: Self-pay

## 2018-10-28 ENCOUNTER — Encounter (HOSPITAL_COMMUNITY): Payer: Self-pay

## 2018-10-28 DIAGNOSIS — N3000 Acute cystitis without hematuria: Secondary | ICD-10-CM | POA: Diagnosis not present

## 2018-10-28 DIAGNOSIS — R609 Edema, unspecified: Secondary | ICD-10-CM | POA: Diagnosis not present

## 2018-10-28 DIAGNOSIS — F0390 Unspecified dementia without behavioral disturbance: Secondary | ICD-10-CM | POA: Diagnosis not present

## 2018-10-28 DIAGNOSIS — W1830XA Fall on same level, unspecified, initial encounter: Secondary | ICD-10-CM | POA: Diagnosis not present

## 2018-10-28 DIAGNOSIS — Z91040 Latex allergy status: Secondary | ICD-10-CM | POA: Diagnosis not present

## 2018-10-28 DIAGNOSIS — Z7401 Bed confinement status: Secondary | ICD-10-CM | POA: Diagnosis not present

## 2018-10-28 DIAGNOSIS — I129 Hypertensive chronic kidney disease with stage 1 through stage 4 chronic kidney disease, or unspecified chronic kidney disease: Secondary | ICD-10-CM | POA: Insufficient documentation

## 2018-10-28 DIAGNOSIS — S199XXA Unspecified injury of neck, initial encounter: Secondary | ICD-10-CM | POA: Diagnosis not present

## 2018-10-28 DIAGNOSIS — S0003XA Contusion of scalp, initial encounter: Secondary | ICD-10-CM

## 2018-10-28 DIAGNOSIS — Z79899 Other long term (current) drug therapy: Secondary | ICD-10-CM | POA: Insufficient documentation

## 2018-10-28 DIAGNOSIS — N183 Chronic kidney disease, stage 3 (moderate): Secondary | ICD-10-CM | POA: Insufficient documentation

## 2018-10-28 DIAGNOSIS — S0083XA Contusion of other part of head, initial encounter: Secondary | ICD-10-CM | POA: Diagnosis not present

## 2018-10-28 DIAGNOSIS — Z23 Encounter for immunization: Secondary | ICD-10-CM | POA: Diagnosis not present

## 2018-10-28 DIAGNOSIS — M25551 Pain in right hip: Secondary | ICD-10-CM | POA: Diagnosis not present

## 2018-10-28 DIAGNOSIS — S79911A Unspecified injury of right hip, initial encounter: Secondary | ICD-10-CM | POA: Diagnosis not present

## 2018-10-28 DIAGNOSIS — M25512 Pain in left shoulder: Secondary | ICD-10-CM | POA: Diagnosis not present

## 2018-10-28 DIAGNOSIS — Y92122 Bedroom in nursing home as the place of occurrence of the external cause: Secondary | ICD-10-CM | POA: Diagnosis not present

## 2018-10-28 DIAGNOSIS — I10 Essential (primary) hypertension: Secondary | ICD-10-CM | POA: Diagnosis not present

## 2018-10-28 DIAGNOSIS — N3001 Acute cystitis with hematuria: Secondary | ICD-10-CM | POA: Diagnosis not present

## 2018-10-28 DIAGNOSIS — W19XXXA Unspecified fall, initial encounter: Secondary | ICD-10-CM

## 2018-10-28 DIAGNOSIS — S299XXA Unspecified injury of thorax, initial encounter: Secondary | ICD-10-CM | POA: Diagnosis not present

## 2018-10-28 DIAGNOSIS — R404 Transient alteration of awareness: Secondary | ICD-10-CM | POA: Diagnosis not present

## 2018-10-28 DIAGNOSIS — S4992XA Unspecified injury of left shoulder and upper arm, initial encounter: Secondary | ICD-10-CM | POA: Diagnosis not present

## 2018-10-28 DIAGNOSIS — Y9301 Activity, walking, marching and hiking: Secondary | ICD-10-CM | POA: Diagnosis not present

## 2018-10-28 DIAGNOSIS — Z853 Personal history of malignant neoplasm of breast: Secondary | ICD-10-CM | POA: Insufficient documentation

## 2018-10-28 DIAGNOSIS — Y999 Unspecified external cause status: Secondary | ICD-10-CM | POA: Diagnosis not present

## 2018-10-28 DIAGNOSIS — S0993XA Unspecified injury of face, initial encounter: Secondary | ICD-10-CM | POA: Diagnosis not present

## 2018-10-28 DIAGNOSIS — M2550 Pain in unspecified joint: Secondary | ICD-10-CM | POA: Diagnosis not present

## 2018-10-28 LAB — CBC WITH DIFFERENTIAL/PLATELET
Abs Immature Granulocytes: 0.04 10*3/uL (ref 0.00–0.07)
Basophils Absolute: 0 10*3/uL (ref 0.0–0.1)
Basophils Relative: 0 %
Eosinophils Absolute: 0.1 10*3/uL (ref 0.0–0.5)
Eosinophils Relative: 2 %
HEMATOCRIT: 41.1 % (ref 36.0–46.0)
Hemoglobin: 13.2 g/dL (ref 12.0–15.0)
Immature Granulocytes: 1 %
Lymphocytes Relative: 25 %
Lymphs Abs: 1.7 10*3/uL (ref 0.7–4.0)
MCH: 29.5 pg (ref 26.0–34.0)
MCHC: 32.1 g/dL (ref 30.0–36.0)
MCV: 91.7 fL (ref 80.0–100.0)
Monocytes Absolute: 0.9 10*3/uL (ref 0.1–1.0)
Monocytes Relative: 13 %
Neutro Abs: 4.1 10*3/uL (ref 1.7–7.7)
Neutrophils Relative %: 59 %
Platelets: 243 10*3/uL (ref 150–400)
RBC: 4.48 MIL/uL (ref 3.87–5.11)
RDW: 13.5 % (ref 11.5–15.5)
WBC: 6.8 10*3/uL (ref 4.0–10.5)
nRBC: 0 % (ref 0.0–0.2)

## 2018-10-28 LAB — BASIC METABOLIC PANEL
Anion gap: 9 (ref 5–15)
BUN: 20 mg/dL (ref 8–23)
CO2: 24 mmol/L (ref 22–32)
Calcium: 9.3 mg/dL (ref 8.9–10.3)
Chloride: 105 mmol/L (ref 98–111)
Creatinine, Ser: 0.84 mg/dL (ref 0.44–1.00)
GFR calc non Af Amer: >60 mL/min (ref >60)
Glucose, Bld: 94 mg/dL (ref 70–99)
Potassium: 3.3 mmol/L — ABNORMAL LOW (ref 3.5–5.1)
Sodium: 138 mmol/L (ref 135–145)

## 2018-10-28 LAB — URINALYSIS, ROUTINE W REFLEX MICROSCOPIC
Bilirubin Urine: NEGATIVE
Glucose, UA: NEGATIVE mg/dL
Ketones, ur: NEGATIVE mg/dL
Nitrite: NEGATIVE
Protein, ur: NEGATIVE mg/dL
Specific Gravity, Urine: 1.009 (ref 1.005–1.030)
WBC, UA: >50 WBC/hpf — ABNORMAL HIGH (ref 0–5)
pH: 8 (ref 5.0–8.0)

## 2018-10-28 LAB — I-STAT TROPONIN, ED: Troponin i, poc: 0.01 ng/mL (ref 0.00–0.08)

## 2018-10-28 MED ORDER — POTASSIUM CHLORIDE CRYS ER 20 MEQ PO TBCR
20.0000 meq | EXTENDED_RELEASE_TABLET | Freq: Once | ORAL | Status: AC
  Administered 2018-10-28: 20 meq via ORAL
  Filled 2018-10-28: qty 1

## 2018-10-28 MED ORDER — TETANUS-DIPHTH-ACELL PERTUSSIS 5-2.5-18.5 LF-MCG/0.5 IM SUSP
0.5000 mL | Freq: Once | INTRAMUSCULAR | Status: AC
  Administered 2018-10-28: 0.5 mL via INTRAMUSCULAR
  Filled 2018-10-28: qty 0.5

## 2018-10-28 MED ORDER — CEPHALEXIN 500 MG PO CAPS: 500.0000 mg | ORAL_CAPSULE | Freq: Two times a day (BID) | ORAL | 0 refills | Status: AC

## 2018-10-28 MED ORDER — CEPHALEXIN 500 MG PO CAPS
500.0000 mg | ORAL_CAPSULE | Freq: Once | ORAL | Status: AC
  Administered 2018-10-28: 500 mg via ORAL
  Filled 2018-10-28: qty 1

## 2018-10-28 NOTE — Discharge Instructions (Signed)
Ms. Wilfong had imaging of her head, face, neck, left arm, chest, and hip and pelvis.  She did not have any fractures or bleeding.  We did find incidentally that Ms. Spatz has a urinary tract infection.  We will treat with Keflex twice a day for 7 days.  She got her first dose in the emergency department.  Her potassium was a little bit low, so we gave her her home dose of potassium this morning.   Please bring her back to the emergency department if she appears to have altered mental status, fevers, nausea or vomiting, worsening ambulation.  Thank you for allowing [place] to participate in your care today.

## 2018-10-28 NOTE — ED Notes (Signed)
Pure wick in place to collect urine

## 2018-10-28 NOTE — ED Provider Notes (Signed)
[facility] [place] DEPT Provider Note   CSN: [id number] Arrival date & time: 10/28/18  0740     History   Chief Complaint Chief Complaint  Patient presents with  . Fall    HPI [name] is a 85 y.o. female.  HPI  Patient is a 85 year old female with a history of TIA, left breast cancer, hyperlipidemia, hypertension, dementia presenting for unwitnessed fall from her nursing care facility, [facility].  History is limited as patient has dementia.  She reports that she was trying to get to the bathroom without a cane or walker this morning when she "tripped".  Patient does not believe that she lost consciousness or syncopized prior to the event.  She denies any chest pain, shortness of breath, dizziness, lightheadedness, abdominal pain, nausea, vomiting, or diarrhea.  She reports pain is greatest in her left eye.  EMS noting that patient had a painful right hip that was shortened and externally rotated and binder was applied.  Collateral information obtained from patient's nurse at [facility].  She states that she is unsure of the circumstances of the fall, as it occurred around shift change.  Patient was otherwise at her baseline neurologically, had no signs or symptoms of infection with fever, cough, or abnormal vital signs.  Patient is on a 10-day course of Tamiflu for prophylaxis due to positive influenza in the facility.   Past Medical History:  Diagnosis Date  . Anxiety   . Arthritis   . Breast cancer ([place])    left  . Cough   . Esophageal stricture   . GERD (gastroesophageal reflux disease)   . Hyperlipidemia   . Hypertension   . Thrombosed external hemorrhoid, R posterior 01/20/2012  . TIA (transient ischemic attack)     Patient Active Problem List   Diagnosis Date Noted  . Severe sepsis ([place]) 08/24/2018  . Hypokalemia 08/24/2018  . Lactic acidosis 08/24/2018  . Hypotension 08/24/2018  . Acute metabolic encephalopathy  63/87/5643  . Aggressive behavior of adult 07/04/2018  . Adverse drug reaction 12/25/2017  . Closed pelvic fracture ([place]) 12/18/2017  . Closed fracture of multiple pubic rami ([place]) 12/18/2017  . CKD (chronic kidney disease) stage 3, GFR 30-59 ml/min ([facility]) 12/18/2017  . Internal hemorrhoids with complication with suspected prolapse 02/16/2014  . Cancer of upper-outer quadrant of female breast ([place]) 06/16/2012  . Hemorrhoids, internal, thrombosed, Left lateral 02/10/2012  . CONSTIPATION 04/18/2008  . HYPERLIPIDEMIA 04/15/2008  . Hypertension 04/15/2008  . TRANSIENT ISCHEMIC ATTACK 04/15/2008  . ESOPHAGEAL STRICTURE 04/15/2008  . GERD 04/15/2008  . ARTHRITIS 04/15/2008    Past Surgical History:  Procedure Laterality Date  . ABDOMINAL HYSTERECTOMY    . BREAST LUMPECTOMY  2010   left  . CHOLECYSTECTOMY  2002  . CYSTECTOMY  in her 60's  . HEEL SPUR SURGERY     right  . MASTECTOMY, PARTIAL     left  . TONSILLECTOMY       OB History   No obstetric history on file.      Home Medications    Prior to Admission medications   Medication Sig Start Date End Date Taking? Authorizing Provider  acetaminophen (TYLENOL) 325 MG tablet Take 650 mg by mouth every 4 (four) hours as needed for mild pain.     [provider]  alum & mag hydroxide-simeth (MINTOX REGULAR STRENGTH) 200-200-20 MG/5ML suspension Take 30 mLs by mouth every 6 (six) hours as needed for indigestion or heartburn.  [provider]  amLODipine (NORVASC) 10 MG tablet Take 10 mg by mouth daily.    [provider]  cholecalciferol (VITAMIN D) 25 MCG (1000 UT) tablet Take 1,000 Units by mouth daily.     [provider]  divalproex (DEPAKOTE SPRINKLE) 125 MG capsule Take 250 mg by mouth 2 (two) times daily.    [provider]  furosemide (LASIX) 40 MG tablet Take 1 tablet (40 mg total) by mouth daily. Patient taking differently: Take 20 mg by mouth every Monday, Wednesday, and  Friday.  01/09/18   Medina-Vargas, Monina C, NP  guaifenesin (ROBAFEN) 100 MG/5ML syrup Take 200 mg by mouth every 6 (six) hours as needed for cough.    [provider]  loperamide (IMODIUM) 2 MG capsule Take 2 mg by mouth as needed for diarrhea or loose stools.    [provider]  LORazepam (ATIVAN) 0.5 MG tablet Take 0.5 mg by mouth every 6 (six) hours as needed for anxiety.    [provider]  magnesium hydroxide (MILK OF MAGNESIA) 400 MG/5ML suspension Take 30 mLs by mouth at bedtime as needed for mild constipation.    [provider]  metoprolol tartrate (LOPRESSOR) 50 MG tablet Take 50 mg by mouth 2 (two) times daily.    [provider]  neomycin-bacitracin-polymyxin (NEOSPORIN) ointment Apply 1 application topically every 12 (twelve) hours.    [provider]  OLANZapine (ZYPREXA) 5 MG tablet Take 5 mg by mouth at bedtime.    [provider]  potassium chloride SA (K-DUR,KLOR-CON) 20 MEQ tablet Take 1 tablet (20 mEq total) by mouth daily. 01/09/18   Medina-Vargas, Monina C, NP  psyllium (METAMUCIL SMOOTH TEXTURE) 28 % packet Take 1 packet by mouth 2 (two) times daily. 02/16/14   Stark Klein, MD  traZODone (DESYREL) 50 MG tablet Take 50 mg by mouth at bedtime.    [provider]    Family History Family History  Problem Relation Age of Onset  . Lung cancer Brother   . Cervical cancer Sister   . Hypertension Father   . Colon cancer Neg Hx   . Esophageal cancer Neg Hx   . Rectal cancer Neg Hx   . Stomach cancer Neg Hx     Social History Social History   Tobacco Use  . Smoking status: Never Smoker  . Smokeless tobacco: Never Used  Substance Use Topics  . Alcohol use: No  . Drug use: No     Allergies   Hydrocodone; Ibandronate sodium; Latex; Sulfonamide derivatives; and Tessalon [benzonatate]   Review of Systems Review of Systems  Respiratory: Negative for chest tightness and shortness of breath.     Cardiovascular: Negative for chest pain.  Gastrointestinal: Negative for abdominal pain, nausea and vomiting.  Musculoskeletal: Positive for arthralgias.    Level 5 caveat dementia.  Physical Exam Updated Vital Signs Ht 5\' 6"  (1.676 m)   Wt 72.6 kg   SpO2 98%   BMI 25.82 kg/m   Physical Exam Vitals signs and nursing note reviewed.  Constitutional:      General: She is not in acute distress.    Appearance: She is well-developed.  HENT:     Head: Normocephalic.     Comments: Edematous and ecchymotic left supraorbital rim and temple.     Nose: Nose normal.  Eyes:     Extraocular Movements: Extraocular movements intact.     Conjunctiva/sclera: Conjunctivae normal.     Pupils: Pupils are equal, round, and reactive  to light.     Comments: No EOM entrapment.   Neck:     Musculoskeletal: Normal range of motion and neck supple.  Cardiovascular:     Rate and Rhythm: Normal rate and regular rhythm.     Heart sounds: S1 normal and S2 normal. Murmur present.     Comments: Holosystolic murmur.  Pulmonary:     Effort: Pulmonary effort is normal.     Breath sounds: Normal breath sounds. No wheezing or rales.  Abdominal:     General: There is no distension.     Palpations: Abdomen is soft.     Tenderness: There is no abdominal tenderness. There is no guarding.  Musculoskeletal:     Comments: Pelvic binder in place.  No tenderness to palpation of her right lateral and left lateral hip.  No tenderness to palpation of bilateral knees or ankles.  Patient has intact, 2+ DP pulses bilaterally. Cervical collar in place.  Patient logrolled with inline stabilization has no midline tenderness of thoracic or lumbar spine.  No pelvic tenderness to palpation. Left shoulder with tenderness palpation of humeral head.  No tenderness to palpation of left humeral shaft, elbow, wrist.  Patient has no tenderness to palpation of right humeral head, elbow or shaft.  Lymphadenopathy:     Cervical: No  cervical adenopathy.  Skin:    General: Skin is warm and dry.     Findings: No erythema or rash.  Neurological:     Mental Status: She is alert.     Comments: Cranial nerves grossly intact. Patient moves extremities symmetrically and with good coordination.  Psychiatric:        Behavior: Behavior normal.        Thought Content: Thought content normal.        Judgment: Judgment normal.      ED Treatments / Results  Labs (all labs ordered are listed, but only abnormal results are displayed) Labs Reviewed  CBC WITH DIFFERENTIAL/PLATELET  BASIC METABOLIC PANEL  URINALYSIS, ROUTINE W REFLEX MICROSCOPIC  I-STAT TROPONIN, ED    EKG EKG Interpretation  Date/Time:  Wednesday October 28 2018 08:08:23 EST Ventricular Rate:  66 PR Interval:    QRS Duration: 128 QT Interval:  455 QTC Calculation: 477 R Axis:   81 Text Interpretation:  Age not entered, assumed to be  85 years old for purpose of ECG interpretation Junctional rhythm Nonspecific intraventricular conduction delay Artifact in lead(s) I II III aVR aVL aVF V1 V2 V3 and baseline wander in lead(s) II III aVL aVF Confirmed by Gerlene Fee [id number]) on 10/28/2018 11:59:27 AM   Radiology Ct Head Wo Contrast  Result Date: 10/28/2018 CLINICAL DATA:  85 year old female with unwitnessed fall, found down. Confusion. EXAM: CT HEAD WITHOUT CONTRAST CT MAXILLOFACIAL WITHOUT CONTRAST CT CERVICAL SPINE WITHOUT CONTRAST TECHNIQUE: Multidetector CT imaging of the head, cervical spine, and maxillofacial structures were performed using the standard protocol without intravenous contrast. Multiplanar CT image reconstructions of the cervical spine and maxillofacial structures were also generated. COMPARISON:  Head CTs 12/24/2017 and earlier. FINDINGS: CT HEAD FINDINGS Brain: Stable cerebral volume. No midline shift, mass effect, or evidence of intracranial mass lesion. No ventriculomegaly. No acute intracranial hemorrhage identified. Patchy bilateral  white matter hypodensity and chronic lacunar infarct of the left basal ganglia appear stable. No cortical encephalomalacia or acute cortically based infarct identified. Vascular: Mild Calcified atherosclerosis at the skull base. No suspicious intracranial vascular hyperdensity. Skull: Stable, intact. Other: Left anterior convexity scalp hematoma measures up to 7  millimeters in thickness. No scalp soft tissue gas. Underlying left frontal bone intact. Other scalp soft tissues appear stable and within normal limits. CT MAXILLOFACIAL FINDINGS Osseous: Mandible intact. Intact maxilla and zygoma. No definite nasal bone fracture. Central skull base intact. Orbits: Chronic thinning or dehiscence of a small focal segment of the left orbital roof is unchanged (series 20, image 34). Otherwise the orbital walls are intact. Orbits soft tissues appears stable and negative. Beginning just above the left orbit there is left forehead and scalp hematoma, as described above. Sinuses: Visualized paranasal sinuses and mastoids are stable and well pneumatized. Tympanic cavities are clear. Soft tissues: Negative visible noncontrast deep soft tissue spaces of the face. No other superficial soft tissue injury identified outside of the left forehead and scalp. CT CERVICAL SPINE FINDINGS Alignment: Preserved cervical lordosis. Cervicothoracic junction alignment is within normal limits. Bilateral posterior element alignment is within normal limits. Skull base and vertebrae: Visualized skull base is intact. No atlanto-occipital dissociation. No cervical spine fracture identified. Soft tissues and spinal canal: No prevertebral fluid or swelling. No visible canal hematoma. Negative noncontrast neck soft tissues. Disc levels: Mild for age cervical spine degeneration, with isolated borderline to mild degenerative spinal stenosis at C5-C6. Upper chest: Visible upper thoracic levels appear intact. Negative lung apices. Small volume of gas in the left  lower IJ is probably related to recent intravenous access. IMPRESSION: 1. Left forehead and scalp hematoma without underlying fracture. 2. No other acute traumatic injury identified. 3.  Stable non contrast CT appearance of the brain since 2019. 4. Mild for age cervical spine degeneration. Electronically Signed   By: Genevie Ann M.D.   On: 10/28/2018 08:53   Ct Cervical Spine Wo Contrast  Result Date: 10/28/2018 CLINICAL DATA:  85 year old female with unwitnessed fall, found down. Confusion. EXAM: CT HEAD WITHOUT CONTRAST CT MAXILLOFACIAL WITHOUT CONTRAST CT CERVICAL SPINE WITHOUT CONTRAST TECHNIQUE: Multidetector CT imaging of the head, cervical spine, and maxillofacial structures were performed using the standard protocol without intravenous contrast. Multiplanar CT image reconstructions of the cervical spine and maxillofacial structures were also generated. COMPARISON:  Head CTs 12/24/2017 and earlier. FINDINGS: CT HEAD FINDINGS Brain: Stable cerebral volume. No midline shift, mass effect, or evidence of intracranial mass lesion. No ventriculomegaly. No acute intracranial hemorrhage identified. Patchy bilateral white matter hypodensity and chronic lacunar infarct of the left basal ganglia appear stable. No cortical encephalomalacia or acute cortically based infarct identified. Vascular: Mild Calcified atherosclerosis at the skull base. No suspicious intracranial vascular hyperdensity. Skull: Stable, intact. Other: Left anterior convexity scalp hematoma measures up to 7 millimeters in thickness. No scalp soft tissue gas. Underlying left frontal bone intact. Other scalp soft tissues appear stable and within normal limits. CT MAXILLOFACIAL FINDINGS Osseous: Mandible intact. Intact maxilla and zygoma. No definite nasal bone fracture. Central skull base intact. Orbits: Chronic thinning or dehiscence of a small focal segment of the left orbital roof is unchanged (series 20, image 34). Otherwise the orbital walls are  intact. Orbits soft tissues appears stable and negative. Beginning just above the left orbit there is left forehead and scalp hematoma, as described above. Sinuses: Visualized paranasal sinuses and mastoids are stable and well pneumatized. Tympanic cavities are clear. Soft tissues: Negative visible noncontrast deep soft tissue spaces of the face. No other superficial soft tissue injury identified outside of the left forehead and scalp. CT CERVICAL SPINE FINDINGS Alignment: Preserved cervical lordosis. Cervicothoracic junction alignment is within normal limits. Bilateral posterior element alignment is within normal limits.  Skull base and vertebrae: Visualized skull base is intact. No atlanto-occipital dissociation. No cervical spine fracture identified. Soft tissues and spinal canal: No prevertebral fluid or swelling. No visible canal hematoma. Negative noncontrast neck soft tissues. Disc levels: Mild for age cervical spine degeneration, with isolated borderline to mild degenerative spinal stenosis at C5-C6. Upper chest: Visible upper thoracic levels appear intact. Negative lung apices. Small volume of gas in the left lower IJ is probably related to recent intravenous access. IMPRESSION: 1. Left forehead and scalp hematoma without underlying fracture. 2. No other acute traumatic injury identified. 3.  Stable non contrast CT appearance of the brain since 2019. 4. Mild for age cervical spine degeneration. Electronically Signed   By: Genevie Ann M.D.   On: 10/28/2018 08:53   Dg Chest Portable 1 View  Result Date: 10/28/2018 CLINICAL DATA:  85 year old female with history of fall. EXAM: PORTABLE CHEST 1 VIEW COMPARISON:  Chest x-ray 08/23/2018. FINDINGS: Lung volumes are normal. No consolidative airspace disease. No pleural effusions. No pneumothorax. No pulmonary nodule or mass noted. Prominence of the central pulmonary arteries, which may suggest pulmonary arterial hypertension. Heart size and mediastinal contours are  otherwise unremarkable. IMPRESSION: 1. No radiographic evidence of acute cardiopulmonary disease. 2. Dilatation of the central pulmonary arteries which may suggest pulmonary arterial hypertension. Electronically Signed   By: Vinnie Langton M.D.   On: 10/28/2018 09:33   Dg Shoulder Left  Result Date: 10/28/2018 CLINICAL DATA:  Recent fall with left shoulder pain, initial encounter EXAM: LEFT SHOULDER - 2+ VIEW COMPARISON:  None. FINDINGS: Degenerative changes of the acromioclavicular joint are seen. No acute fracture or dislocation is noted. No rib abnormality is noted. No soft tissue abnormality is seen. IMPRESSION: Degenerative changes without acute abnormality. Electronically Signed   By: Inez Catalina M.D.   On: 10/28/2018 09:26   Dg Hip Unilat  With Pelvis 2-3 Views Right  Result Date: 10/28/2018 CLINICAL DATA:  Recent fall with hip pain, initial encounter EXAM: DG HIP (WITH OR WITHOUT PELVIS) 2-3V RIGHT COMPARISON:  09/28/2018 FINDINGS: There again noted changes consistent with prior fracture of the right superior and inferior pubic ramus with healing. Degenerative changes of the right hip joint are noted. No acute fracture or dislocation is seen. Healed left superior pubic ramus fracture is noted as well. No new focal abnormality is noted. IMPRESSION: Chronic changes without acute abnormality. Electronically Signed   By: Inez Catalina M.D.   On: 10/28/2018 09:25   Ct Maxillofacial Wo Cm  Result Date: 10/28/2018 CLINICAL DATA:  85 year old female with unwitnessed fall, found down. Confusion. EXAM: CT HEAD WITHOUT CONTRAST CT MAXILLOFACIAL WITHOUT CONTRAST CT CERVICAL SPINE WITHOUT CONTRAST TECHNIQUE: Multidetector CT imaging of the head, cervical spine, and maxillofacial structures were performed using the standard protocol without intravenous contrast. Multiplanar CT image reconstructions of the cervical spine and maxillofacial structures were also generated. COMPARISON:  Head CTs 12/24/2017 and  earlier. FINDINGS: CT HEAD FINDINGS Brain: Stable cerebral volume. No midline shift, mass effect, or evidence of intracranial mass lesion. No ventriculomegaly. No acute intracranial hemorrhage identified. Patchy bilateral white matter hypodensity and chronic lacunar infarct of the left basal ganglia appear stable. No cortical encephalomalacia or acute cortically based infarct identified. Vascular: Mild Calcified atherosclerosis at the skull base. No suspicious intracranial vascular hyperdensity. Skull: Stable, intact. Other: Left anterior convexity scalp hematoma measures up to 7 millimeters in thickness. No scalp soft tissue gas. Underlying left frontal bone intact. Other scalp soft tissues appear stable and within normal limits.  CT MAXILLOFACIAL FINDINGS Osseous: Mandible intact. Intact maxilla and zygoma. No definite nasal bone fracture. Central skull base intact. Orbits: Chronic thinning or dehiscence of a small focal segment of the left orbital roof is unchanged (series 20, image 34). Otherwise the orbital walls are intact. Orbits soft tissues appears stable and negative. Beginning just above the left orbit there is left forehead and scalp hematoma, as described above. Sinuses: Visualized paranasal sinuses and mastoids are stable and well pneumatized. Tympanic cavities are clear. Soft tissues: Negative visible noncontrast deep soft tissue spaces of the face. No other superficial soft tissue injury identified outside of the left forehead and scalp. CT CERVICAL SPINE FINDINGS Alignment: Preserved cervical lordosis. Cervicothoracic junction alignment is within normal limits. Bilateral posterior element alignment is within normal limits. Skull base and vertebrae: Visualized skull base is intact. No atlanto-occipital dissociation. No cervical spine fracture identified. Soft tissues and spinal canal: No prevertebral fluid or swelling. No visible canal hematoma. Negative noncontrast neck soft tissues. Disc levels:  Mild for age cervical spine degeneration, with isolated borderline to mild degenerative spinal stenosis at C5-C6. Upper chest: Visible upper thoracic levels appear intact. Negative lung apices. Small volume of gas in the left lower IJ is probably related to recent intravenous access. IMPRESSION: 1. Left forehead and scalp hematoma without underlying fracture. 2. No other acute traumatic injury identified. 3.  Stable non contrast CT appearance of the brain since 2019. 4. Mild for age cervical spine degeneration. Electronically Signed   By: Genevie Ann M.D.   On: 10/28/2018 08:53    Procedures Procedures (including critical care time)  Medications Ordered in ED Medications  Tdap (BOOSTRIX) injection 0.5 mL (0.5 mLs Intramuscular Given 10/28/18 0923)  potassium chloride SA (K-DUR,KLOR-CON) CR tablet 20 mEq (20 mEq Oral Given 10/28/18 0942)  cephALEXin (KEFLEX) capsule 500 mg (500 mg Oral Given 10/28/18 1107)     Initial Impression / Assessment and Plan / ED Course  I have reviewed the triage vital signs and the nursing notes.  Pertinent labs & imaging results that were available during my care of the patient were reviewed by me and considered in my medical decision making (see chart for details).  Clinical Course as of Oct 29 1203  Wed Oct 28, 2018  0927 Will give home potassium supplementation.   Potassium(!): 3.3 [AM]  1103 Will treat. Previous cultures have been pan-sensitive.   Urinalysis, Routine w reflex microscopic(!) [AM]  1155 Patient ambulated without difficulty.    [AM]  1205 Spoke with ED pharmacist about Keflex dosing for patient specific renal function. Will dose Keflex 500 mg BID.   [AM]    Clinical Course User Index [AM] Albesa Seen, PA-C    Patient is nontoxic-appearing, afebrile, and in no acute distress at rest.  Patient is at her neurologic baseline per assessment from nursing home.  Patient had fall with head trauma.  Patient is not on blood thinning or antiplatelet  agents.  Medications reviewed.  Will obtain CT head, cervical spine, maxillofacial, and radiographs of chest, pelvis, right hip, and left shoulder.  Unclear syncope versus mechanical fall. Patient has presented for frequent falls in the past.   No evidence of intracranial trauma, skull fracture, or cervical spine fracture.  Radiographs of left shoulder, chest, and pelvis are without abnormality.  Patient has slight hypokalemia which is repleted with patient's home dose of medication.  Troponin is negative.  EKG normal sinus rhythm without evidence of ischemia, infarction, or arrhythmia.  Patient does have evidence  of infection on urinalysis.  Will culture.  Patient has had pan positive cultures in the past.  Will treat with Keflex.  Patient tolerated p.o. in emergency department, and ambulated with assistance with a walker.  She is medically cleared for return to her facility.  This is a shared visit with Dr. Gerlene Fee. Patient was independently evaluated by this attending physician. Attending physician consulted in evaluation and discharge management.  Final Clinical Impressions(s) / ED Diagnoses   Final diagnoses:  Fall, initial encounter  Contusion of scalp, initial encounter  Acute cystitis with hematuria    ED Discharge Orders         Ordered    cephALEXin (KEFLEX) 500 MG capsule  2 times daily     10/28/18 1206           Tamala Julian 10/28/18 1207    Maudie Flakes, MD 10/29/18 [phone]

## 2018-10-28 NOTE — ED Notes (Signed)
Pt ambulated around room with walker with standby assist only

## 2018-10-28 NOTE — ED Notes (Signed)
Will place pure wick on pt when she returns from xray

## 2018-10-28 NOTE — ED Notes (Signed)
Patient transported to X-ray 

## 2018-10-28 NOTE — ED Triage Notes (Signed)
Pt to ED by [facility] with c/o of an unwitnessed fall, pt has c-collar placed. Pt was found on the ground from facility with a left anterior head hemaotma, left index finger laceration, and right hip outward rotation. Pt has hx of dementia but is able to say "my left eye hurts a little bit" Pt pupils are 2 responsive, equal and reactive. Pt was confused to what happened and where she is. [facility] gave no medicine.

## 2018-10-28 NOTE — ED Notes (Signed)
Bed: WA13 Expected date:  Expected time:  Means of arrival:  Comments: EMS-fall-hip pain 

## 2018-10-28 NOTE — ED Notes (Signed)
Pt refused to be ambulated. States her hip is in too much pain to stand. RN made aware

## 2018-10-30 LAB — URINE CULTURE: Culture: >=100000 — AB

## 2018-10-31 ENCOUNTER — Telehealth: Payer: Self-pay | Admitting: Emergency Medicine

## 2018-10-31 NOTE — Telephone Encounter (Signed)
Post ED Visit - Positive Culture Follow-up  Culture report reviewed by antimicrobial stewardship pharmacist:  []  Elenor Quinones, Pharm.D. []  Heide Guile, Pharm.D., BCPS AQ-ID []  Parks Neptune, Pharm.D., BCPS []  Alycia Rossetti, Pharm.D., BCPS []  [place], Pharm.D., BCPS, AAHIVP []  Legrand Como, Pharm.D., BCPS, AAHIVP []  Salome Arnt, PharmD, BCPS []  Johnnette Gourd, PharmD, BCPS [x]  Hughes Better, PharmD, BCPS []  Leeroy Cha, PharmD  Positive urine culture Treated with Cephalexin, organism sensitive to the same and no further patient follow-up is required at this time.  [place] Henrine Hayter 10/31/2018, 12:10 PM

## 2018-11-02 DIAGNOSIS — Z8673 Personal history of transient ischemic attack (TIA), and cerebral infarction without residual deficits: Secondary | ICD-10-CM | POA: Diagnosis not present

## 2018-11-02 DIAGNOSIS — M6281 Muscle weakness (generalized): Secondary | ICD-10-CM | POA: Diagnosis not present

## 2018-11-02 DIAGNOSIS — N3000 Acute cystitis without hematuria: Secondary | ICD-10-CM | POA: Diagnosis not present

## 2018-11-02 DIAGNOSIS — F0391 Unspecified dementia with behavioral disturbance: Secondary | ICD-10-CM | POA: Diagnosis not present

## 2018-11-02 DIAGNOSIS — R296 Repeated falls: Secondary | ICD-10-CM | POA: Diagnosis not present

## 2018-11-04 DIAGNOSIS — D649 Anemia, unspecified: Secondary | ICD-10-CM | POA: Diagnosis not present

## 2018-11-04 DIAGNOSIS — Z5181 Encounter for therapeutic drug level monitoring: Secondary | ICD-10-CM | POA: Diagnosis not present

## 2018-11-05 DIAGNOSIS — M6281 Muscle weakness (generalized): Secondary | ICD-10-CM | POA: Diagnosis not present

## 2018-11-05 DIAGNOSIS — R2681 Unsteadiness on feet: Secondary | ICD-10-CM | POA: Diagnosis not present

## 2018-11-09 DIAGNOSIS — M6281 Muscle weakness (generalized): Secondary | ICD-10-CM | POA: Diagnosis not present

## 2018-11-09 DIAGNOSIS — F0391 Unspecified dementia with behavioral disturbance: Secondary | ICD-10-CM | POA: Diagnosis not present

## 2018-11-09 DIAGNOSIS — R296 Repeated falls: Secondary | ICD-10-CM | POA: Diagnosis not present

## 2018-11-09 DIAGNOSIS — Z8673 Personal history of transient ischemic attack (TIA), and cerebral infarction without residual deficits: Secondary | ICD-10-CM | POA: Diagnosis not present

## 2018-11-10 DIAGNOSIS — R2681 Unsteadiness on feet: Secondary | ICD-10-CM | POA: Diagnosis not present

## 2018-11-10 DIAGNOSIS — M6281 Muscle weakness (generalized): Secondary | ICD-10-CM | POA: Diagnosis not present

## 2018-11-16 DIAGNOSIS — F0391 Unspecified dementia with behavioral disturbance: Secondary | ICD-10-CM | POA: Diagnosis not present

## 2018-11-16 DIAGNOSIS — M6281 Muscle weakness (generalized): Secondary | ICD-10-CM | POA: Diagnosis not present

## 2018-11-16 DIAGNOSIS — G4700 Insomnia, unspecified: Secondary | ICD-10-CM | POA: Diagnosis not present

## 2018-11-16 DIAGNOSIS — R600 Localized edema: Secondary | ICD-10-CM | POA: Diagnosis not present

## 2018-11-18 ENCOUNTER — Emergency Department (HOSPITAL_COMMUNITY)
Admission: EM | Admit: 2018-11-18 | Discharge: 2018-11-18 | Disposition: A | Discharge status: Home / Self Care | Payer: PPO | Attending: Emergency Medicine | Admitting: Emergency Medicine

## 2018-11-18 ENCOUNTER — Other Ambulatory Visit: Payer: Self-pay

## 2018-11-18 ENCOUNTER — Encounter (HOSPITAL_COMMUNITY): Payer: Self-pay | Admitting: Emergency Medicine

## 2018-11-18 DIAGNOSIS — Z79899 Other long term (current) drug therapy: Secondary | ICD-10-CM | POA: Diagnosis not present

## 2018-11-18 DIAGNOSIS — Y929 Unspecified place or not applicable: Secondary | ICD-10-CM | POA: Diagnosis not present

## 2018-11-18 DIAGNOSIS — Z91040 Latex allergy status: Secondary | ICD-10-CM | POA: Diagnosis not present

## 2018-11-18 DIAGNOSIS — Y92129 Unspecified place in nursing home as the place of occurrence of the external cause: Secondary | ICD-10-CM | POA: Diagnosis not present

## 2018-11-18 DIAGNOSIS — R404 Transient alteration of awareness: Secondary | ICD-10-CM | POA: Diagnosis not present

## 2018-11-18 DIAGNOSIS — Y939 Activity, unspecified: Secondary | ICD-10-CM | POA: Insufficient documentation

## 2018-11-18 DIAGNOSIS — W1830XA Fall on same level, unspecified, initial encounter: Secondary | ICD-10-CM | POA: Diagnosis not present

## 2018-11-18 DIAGNOSIS — R001 Bradycardia, unspecified: Secondary | ICD-10-CM | POA: Diagnosis not present

## 2018-11-18 DIAGNOSIS — Z853 Personal history of malignant neoplasm of breast: Secondary | ICD-10-CM | POA: Insufficient documentation

## 2018-11-18 DIAGNOSIS — F0390 Unspecified dementia without behavioral disturbance: Secondary | ICD-10-CM | POA: Diagnosis not present

## 2018-11-18 DIAGNOSIS — Y999 Unspecified external cause status: Secondary | ICD-10-CM | POA: Insufficient documentation

## 2018-11-18 DIAGNOSIS — R05 Cough: Secondary | ICD-10-CM | POA: Insufficient documentation

## 2018-11-18 DIAGNOSIS — I10 Essential (primary) hypertension: Secondary | ICD-10-CM | POA: Diagnosis not present

## 2018-11-18 DIAGNOSIS — W19XXXA Unspecified fall, initial encounter: Secondary | ICD-10-CM

## 2018-11-18 DIAGNOSIS — R5381 Other malaise: Secondary | ICD-10-CM | POA: Diagnosis not present

## 2018-11-18 DIAGNOSIS — M2550 Pain in unspecified joint: Secondary | ICD-10-CM | POA: Diagnosis not present

## 2018-11-18 DIAGNOSIS — G459 Transient cerebral ischemic attack, unspecified: Secondary | ICD-10-CM | POA: Diagnosis not present

## 2018-11-18 DIAGNOSIS — Z7401 Bed confinement status: Secondary | ICD-10-CM | POA: Diagnosis not present

## 2018-11-18 DIAGNOSIS — S51011A Laceration without foreign body of right elbow, initial encounter: Secondary | ICD-10-CM

## 2018-11-18 LAB — URINALYSIS, ROUTINE W REFLEX MICROSCOPIC
Bilirubin Urine: NEGATIVE
GLUCOSE, UA: NEGATIVE mg/dL
Hgb urine dipstick: NEGATIVE
Ketones, ur: NEGATIVE mg/dL
LEUKOCYTE UA: NEGATIVE
Nitrite: NEGATIVE
PROTEIN: NEGATIVE mg/dL
Specific Gravity, Urine: 1.006 (ref 1.005–1.030)
pH: 7 (ref 5.0–8.0)

## 2018-11-18 NOTE — ED Notes (Signed)
Attempted to call report to [facility] x 3 unsuccessful

## 2018-11-18 NOTE — ED Notes (Signed)
Asked pt several times to try and ambulate with assistance, pt adamantly refused stating she was 'tired and just wanted to rest.' Pt's son stated that she uses a wheelchair at baseline and falls when she tries to walk. Dr. Ellender Hose informed.

## 2018-11-18 NOTE — ED Triage Notes (Signed)
To ED via [facility] from [facility] with staff stating pt fell. Unwitnessed- hx of recent falls - seen at [facility] ED for same. Has old bruising to left cheek and orbit area, abrasion to right elbow

## 2018-11-18 NOTE — ED Notes (Signed)
Report given to [facility]

## 2018-11-18 NOTE — ED Provider Notes (Signed)
Assumed care from Dr. Gilford Raid at 4:04 PM. Briefly, the patient is a 85 y.o. female with PMHx of  has a past medical history of Anxiety, Arthritis, Breast cancer ([place]), Cough, Esophageal stricture, GERD (gastroesophageal reflux disease), Hyperlipidemia, Hypertension, Thrombosed external hemorrhoid, R posterior (01/20/2012), and TIA (transient ischemic attack). here with recurernt falls. Well documented h/o same. No signs of trauma on full exam by Dr. Gilford Raid, imaging not indicated per her assessment. Plan to check UA given h/o falls in setting of UTI. If neg, d/c. If pos, treat and d/c.   Labs Reviewed  URINE CULTURE  URINALYSIS, ROUTINE W REFLEX MICROSCOPIC    Course of Care: UA without signs of dehydration or infection. Pt remains HDS and at mental baseline with no signs of new trauma. She has been calm in ED. D/c back to facility with wound care to her old skin tears, fall precautions.     Duffy Bruce, MD 11/18/18 [phone]

## 2018-11-18 NOTE — ED Notes (Signed)
Patient verbalizes understanding of discharge instructions. Opportunity for questioning and answers were provided. Armband removed by staff, pt discharged from ED.  

## 2018-11-18 NOTE — ED Provider Notes (Signed)
[facility] EMERGENCY DEPARTMENT Provider Note   CSN: [id number] Arrival date & time: 11/18/18  1220     History   Chief Complaint Chief Complaint  Patient presents with  . Fall    [name] is a 85 y.o. female.  Pt presents to the ED today with an unwitnessed fall from SNF.  Pt has dementia and is unable to give any hx.     Past Medical History:  Diagnosis Date  . Anxiety   . Arthritis   . Breast cancer ([place])    left  . Cough   . Esophageal stricture   . GERD (gastroesophageal reflux disease)   . Hyperlipidemia   . Hypertension   . Thrombosed external hemorrhoid, R posterior 01/20/2012  . TIA (transient ischemic attack)     Patient Active Problem List   Diagnosis Date Noted  . Severe sepsis ([place]) 08/24/2018  . Hypokalemia 08/24/2018  . Lactic acidosis 08/24/2018  . Hypotension 08/24/2018  . Acute metabolic encephalopathy 70/96/2836  . Aggressive behavior of adult 07/04/2018  . Adverse drug reaction 12/25/2017  . Closed pelvic fracture ([place]) 12/18/2017  . Closed fracture of multiple pubic rami ([place]) 12/18/2017  . CKD (chronic kidney disease) stage 3, GFR 30-59 ml/min ([facility]) 12/18/2017  . Internal hemorrhoids with complication with suspected prolapse 02/16/2014  . Cancer of upper-outer quadrant of female breast ([place]) 06/16/2012  . Hemorrhoids, internal, thrombosed, Left lateral 02/10/2012  . CONSTIPATION 04/18/2008  . HYPERLIPIDEMIA 04/15/2008  . Hypertension 04/15/2008  . TRANSIENT ISCHEMIC ATTACK 04/15/2008  . ESOPHAGEAL STRICTURE 04/15/2008  . GERD 04/15/2008  . ARTHRITIS 04/15/2008    Past Surgical History:  Procedure Laterality Date  . ABDOMINAL HYSTERECTOMY    . BREAST LUMPECTOMY  2010   left  . CHOLECYSTECTOMY  2002  . CYSTECTOMY  in her 6's  . HEEL SPUR SURGERY     right  . MASTECTOMY, PARTIAL     left  . TONSILLECTOMY       OB History   No obstetric history on file.      Home Medications    Prior  to Admission medications   Medication Sig Start Date End Date Taking? Authorizing Provider  acetaminophen (TYLENOL) 500 MG tablet Take 500 mg by mouth every 4 (four) hours as needed for mild pain or headache.     [provider]  alum & mag hydroxide-simeth (MINTOX REGULAR STRENGTH) 200-200-20 MG/5ML suspension Take 30 mLs by mouth every 6 (six) hours as needed for indigestion or heartburn.    [provider]  amLODipine (NORVASC) 10 MG tablet Take 10 mg by mouth daily.    [provider]  cholecalciferol (VITAMIN D) 25 MCG (1000 UT) tablet Take 1,000 Units by mouth daily.     [provider]  divalproex (DEPAKOTE SPRINKLE) 125 MG capsule Take 250 mg by mouth every 12 (twelve) hours. 8am/8pm    [provider]  furosemide (LASIX) 40 MG tablet Take 1 tablet (40 mg total) by mouth daily. Patient not taking: Reported on 10/28/2018 01/09/18   Medina-Vargas, Monina C, NP  guaifenesin (ROBAFEN) 100 MG/5ML syrup Take 200 mg by mouth every 6 (six) hours as needed for cough.    [provider]  loperamide (IMODIUM) 2 MG capsule Take 2 mg by mouth as needed for diarrhea or loose stools.    [provider]  LORazepam (ATIVAN) 0.5 MG tablet Take 0.5 mg by mouth every 6 (six) hours as needed for anxiety.  [provider]  magnesium hydroxide (MILK OF MAGNESIA) 400 MG/5ML suspension Take 30 mLs by mouth at bedtime as needed for mild constipation.    [provider]  metoprolol tartrate (LOPRESSOR) 50 MG tablet Take 50 mg by mouth 2 (two) times daily. Take 50mg  by mouth twice daily with meals. Hold for systolic blood pressure less than 115 or heart rate less than 60. Pt takes at 0800 and 20:00    [provider]  neomycin-bacitracin-polymyxin (NEOSPORIN) ointment Apply 1 application topically every 12 (twelve) hours.    [provider]  OLANZapine (ZYPREXA) 5 MG tablet Take 5 mg by mouth at bedtime.    [provider]  potassium chloride SA (K-DUR,KLOR-CON) 20 MEQ tablet Take 1 tablet (20 mEq total) by mouth daily. 01/09/18   Medina-Vargas, Monina C, NP  psyllium (METAMUCIL SMOOTH TEXTURE) 28 % packet Take 1 packet by mouth 2 (two) times daily. 02/16/14   Stark Klein, MD  traZODone (DESYREL) 50 MG tablet Take 50 mg by mouth at bedtime.    [provider]    Family History Family History  Problem Relation Age of Onset  . Lung cancer Brother   . Cervical cancer Sister   . Hypertension Father   . Colon cancer Neg Hx   . Esophageal cancer Neg Hx   . Rectal cancer Neg Hx   . Stomach cancer Neg Hx     Social History Social History   Tobacco Use  . Smoking status: Never Smoker  . Smokeless tobacco: Never Used  Substance Use Topics  . Alcohol use: No  . Drug use: No     Allergies   Hydrocodone; Ibandronate sodium; Latex; Sulfonamide derivatives; and Tessalon [benzonatate]   Review of Systems Review of Systems  Unable to perform ROS: Dementia     Physical Exam Updated Vital Signs BP (!) 142/68 (BP Location: Right Arm)   Pulse (!) 55   Temp (!) 96.6 F (35.9 C) (Temporal)   Resp 14   Ht 5\' 6"  (1.676 m)   Wt 72.5 kg   SpO2 99%   BMI 25.80 kg/m   Physical Exam Vitals signs and nursing note reviewed.  Constitutional:      Appearance: Normal appearance.  HENT:     Head: Normocephalic.     Comments: Old bruising left face    Right Ear: External ear normal.     Left Ear: External ear normal.     Nose: Nose normal.     Mouth/Throat:     Mouth: Mucous membranes are moist.  Eyes:     Extraocular Movements: Extraocular movements intact.     Pupils: Pupils are equal, round, and reactive to light.  Neck:     Musculoskeletal: Normal range of motion and neck supple.  Cardiovascular:     Rate and Rhythm: Normal rate and regular rhythm.     Pulses: Normal pulses.     Heart sounds: Normal heart sounds.  Pulmonary:     Effort: Pulmonary effort is normal.      Breath sounds: Normal breath sounds.  Abdominal:     General: Abdomen is flat.  Musculoskeletal: Normal range of motion.  Skin:    General: Skin is warm.     Capillary Refill: Capillary refill takes less than 2 seconds.     Comments: Skin tear right elbow with old dressing  Neurological:     Mental Status: She is alert. Mental status is at baseline.  Psychiatric:  Mood and Affect: Mood normal.      ED Treatments / Results  Labs (all labs ordered are listed, but only abnormal results are displayed) Labs Reviewed  URINE CULTURE  URINALYSIS, ROUTINE W REFLEX MICROSCOPIC    EKG None  Radiology No results found.  Procedures Procedures (including critical care time)  Medications Ordered in ED Medications - No data to display   Initial Impression / Assessment and Plan / ED Course  I have reviewed the triage vital signs and the nursing notes.  Pertinent labs & imaging results that were available during my care of the patient were reviewed by me and considered in my medical decision making (see chart for details).    Pt does not seem to have any new injuries.  She will be ambulated.  If she can ambulate, she can go back to the SNF.  Pt signed out to Dr. Ellender Hose pending UA and ambulation.  Final Clinical Impressions(s) / ED Diagnoses   Final diagnoses:  Fall, initial encounter  Skin tear of right elbow without complication, initial encounter    ED Discharge Orders    None       Isla Pence, MD 11/18/18 [phone]

## 2018-11-19 LAB — URINE CULTURE: Culture: NO GROWTH

## 2018-11-23 DIAGNOSIS — R001 Bradycardia, unspecified: Secondary | ICD-10-CM | POA: Diagnosis not present

## 2018-11-23 DIAGNOSIS — M6281 Muscle weakness (generalized): Secondary | ICD-10-CM | POA: Diagnosis not present

## 2018-11-23 DIAGNOSIS — R600 Localized edema: Secondary | ICD-10-CM | POA: Diagnosis not present

## 2018-11-23 DIAGNOSIS — R296 Repeated falls: Secondary | ICD-10-CM | POA: Diagnosis not present

## 2018-11-23 DIAGNOSIS — Z760 Encounter for issue of repeat prescription: Secondary | ICD-10-CM | POA: Diagnosis not present

## 2018-11-23 DIAGNOSIS — F0391 Unspecified dementia with behavioral disturbance: Secondary | ICD-10-CM | POA: Diagnosis not present

## 2018-11-30 DIAGNOSIS — R001 Bradycardia, unspecified: Secondary | ICD-10-CM | POA: Diagnosis not present

## 2018-11-30 DIAGNOSIS — F0391 Unspecified dementia with behavioral disturbance: Secondary | ICD-10-CM | POA: Diagnosis not present

## 2018-11-30 DIAGNOSIS — R296 Repeated falls: Secondary | ICD-10-CM | POA: Diagnosis not present

## 2018-11-30 DIAGNOSIS — M6281 Muscle weakness (generalized): Secondary | ICD-10-CM | POA: Diagnosis not present

## 2018-12-03 ENCOUNTER — Encounter (HOSPITAL_COMMUNITY): Payer: Self-pay | Admitting: Emergency Medicine

## 2018-12-03 ENCOUNTER — Emergency Department (HOSPITAL_COMMUNITY)
Admission: EM | Admit: 2018-12-03 | Discharge: 2018-12-03 | Disposition: A | Discharge status: Home / Self Care | Payer: PPO | Attending: Emergency Medicine | Admitting: Emergency Medicine

## 2018-12-03 ENCOUNTER — Emergency Department (HOSPITAL_COMMUNITY): Payer: PPO

## 2018-12-03 DIAGNOSIS — Y9389 Activity, other specified: Secondary | ICD-10-CM | POA: Insufficient documentation

## 2018-12-03 DIAGNOSIS — R52 Pain, unspecified: Secondary | ICD-10-CM | POA: Diagnosis not present

## 2018-12-03 DIAGNOSIS — N390 Urinary tract infection, site not specified: Secondary | ICD-10-CM | POA: Diagnosis not present

## 2018-12-03 DIAGNOSIS — W19XXXA Unspecified fall, initial encounter: Secondary | ICD-10-CM | POA: Diagnosis not present

## 2018-12-03 DIAGNOSIS — Z79899 Other long term (current) drug therapy: Secondary | ICD-10-CM | POA: Insufficient documentation

## 2018-12-03 DIAGNOSIS — Z7401 Bed confinement status: Secondary | ICD-10-CM | POA: Diagnosis not present

## 2018-12-03 DIAGNOSIS — N183 Chronic kidney disease, stage 3 (moderate): Secondary | ICD-10-CM | POA: Insufficient documentation

## 2018-12-03 DIAGNOSIS — R5381 Other malaise: Secondary | ICD-10-CM | POA: Diagnosis not present

## 2018-12-03 DIAGNOSIS — M2550 Pain in unspecified joint: Secondary | ICD-10-CM | POA: Diagnosis not present

## 2018-12-03 DIAGNOSIS — Z853 Personal history of malignant neoplasm of breast: Secondary | ICD-10-CM | POA: Insufficient documentation

## 2018-12-03 DIAGNOSIS — R58 Hemorrhage, not elsewhere classified: Secondary | ICD-10-CM | POA: Diagnosis not present

## 2018-12-03 DIAGNOSIS — Z8673 Personal history of transient ischemic attack (TIA), and cerebral infarction without residual deficits: Secondary | ICD-10-CM | POA: Diagnosis not present

## 2018-12-03 DIAGNOSIS — Z91040 Latex allergy status: Secondary | ICD-10-CM | POA: Diagnosis not present

## 2018-12-03 DIAGNOSIS — S0101XA Laceration without foreign body of scalp, initial encounter: Secondary | ICD-10-CM | POA: Diagnosis not present

## 2018-12-03 DIAGNOSIS — Y998 Other external cause status: Secondary | ICD-10-CM | POA: Diagnosis not present

## 2018-12-03 DIAGNOSIS — M542 Cervicalgia: Secondary | ICD-10-CM | POA: Diagnosis not present

## 2018-12-03 DIAGNOSIS — S0990XA Unspecified injury of head, initial encounter: Secondary | ICD-10-CM | POA: Diagnosis not present

## 2018-12-03 DIAGNOSIS — Y92129 Unspecified place in nursing home as the place of occurrence of the external cause: Secondary | ICD-10-CM | POA: Diagnosis not present

## 2018-12-03 DIAGNOSIS — F419 Anxiety disorder, unspecified: Secondary | ICD-10-CM | POA: Insufficient documentation

## 2018-12-03 DIAGNOSIS — Z9049 Acquired absence of other specified parts of digestive tract: Secondary | ICD-10-CM | POA: Diagnosis not present

## 2018-12-03 DIAGNOSIS — W07XXXA Fall from chair, initial encounter: Secondary | ICD-10-CM | POA: Insufficient documentation

## 2018-12-03 DIAGNOSIS — S199XXA Unspecified injury of neck, initial encounter: Secondary | ICD-10-CM | POA: Diagnosis not present

## 2018-12-03 DIAGNOSIS — R51 Headache: Secondary | ICD-10-CM | POA: Diagnosis not present

## 2018-12-03 DIAGNOSIS — I129 Hypertensive chronic kidney disease with stage 1 through stage 4 chronic kidney disease, or unspecified chronic kidney disease: Secondary | ICD-10-CM | POA: Insufficient documentation

## 2018-12-03 DIAGNOSIS — F0390 Unspecified dementia without behavioral disturbance: Secondary | ICD-10-CM | POA: Insufficient documentation

## 2018-12-03 LAB — URINALYSIS, ROUTINE W REFLEX MICROSCOPIC
BILIRUBIN URINE: NEGATIVE
Glucose, UA: NEGATIVE mg/dL
Hgb urine dipstick: NEGATIVE
Ketones, ur: NEGATIVE mg/dL
Nitrite: NEGATIVE
Protein, ur: NEGATIVE mg/dL
Specific Gravity, Urine: 1.006 (ref 1.005–1.030)
pH: 7 (ref 5.0–8.0)

## 2018-12-03 MED ORDER — TRAZODONE HCL 50 MG PO TABS
50.0000 mg | ORAL_TABLET | Freq: Every day | ORAL | Status: DC
  Administered 2018-12-03: 50 mg via ORAL
  Filled 2018-12-03: qty 1

## 2018-12-03 MED ORDER — CEPHALEXIN 500 MG PO CAPS
500.0000 mg | ORAL_CAPSULE | Freq: Once | ORAL | Status: AC
  Administered 2018-12-03: 500 mg via ORAL
  Filled 2018-12-03: qty 1

## 2018-12-03 MED ORDER — LIDOCAINE-EPINEPHRINE-TETRACAINE (LET) SOLUTION
3.0000 mL | Freq: Once | NASAL | Status: AC
  Administered 2018-12-03: 3 mL via TOPICAL
  Filled 2018-12-03: qty 3

## 2018-12-03 MED ORDER — CEPHALEXIN 500 MG PO CAPS: 500.0000 mg | ORAL_CAPSULE | Freq: Four times a day (QID) | ORAL | 0 refills | Status: AC

## 2018-12-03 NOTE — Discharge Instructions (Signed)
You have been evaluated for your fall.  You suffered a laceration to your scalp.  Surgical staples was placed and will need to have it removed in 5-7 days.  Take tylenol as needed for pain.  Your  urine shows evidence of urinary tract infection.  Take Keflex as prescribed.  Follow up with your doctor in 1 week for recheck of urine.

## 2018-12-03 NOTE — ED Notes (Signed)
Bed: WTR5 Expected date:  Expected time:  Means of arrival:  Comments: EMS-fall

## 2018-12-03 NOTE — ED Notes (Signed)
Pt ambulated with one person assist to the bathroom. She was able to urinate in the toilet, but RN found that her brief was wet. Brief changed with Horris Latino, EMT assisting.

## 2018-12-03 NOTE — ED Triage Notes (Signed)
Per EMS-states she was sitting in her chair when she fell asleep she fell forward hitting her head on the window sill-no LOC-small laceration to top of head-skin tear to left elbow

## 2018-12-03 NOTE — ED Provider Notes (Signed)
[facility] DEPT Provider Note   CSN: [id number] Arrival date & time: 12/03/18  1511    History   Chief Complaint Chief Complaint  Patient presents with  . Fall    HPI [name] is a 85 y.o. female.     The history is provided by the EMS personnel and medical records. No language interpreter was used.     85 year old female with hx of dementia brought here via EMS from nursing facility for evaluation of a recent fall.  Due to baseline dementia, unable to obtain much history.  Per EMS note, patient was sitting in her chair when she fell asleep and fell forward hitting her head on the windowsill.  No report of any loss of consciousness.  Fall was witnessed.  Patient was noted to have a small laceration to the top of her head and skin tear to left elbow.  Patient has also had multiple recurrent falls which was evaluated in the ED.  Level V caveats for baseline dementia    Past Medical History:  Diagnosis Date  . Anxiety   . Arthritis   . Breast cancer ([place])    left  . Cough   . Esophageal stricture   . GERD (gastroesophageal reflux disease)   . Hyperlipidemia   . Hypertension   . Thrombosed external hemorrhoid, R posterior 01/20/2012  . TIA (transient ischemic attack)     Patient Active Problem List   Diagnosis Date Noted  . Severe sepsis ([place]) 08/24/2018  . Hypokalemia 08/24/2018  . Lactic acidosis 08/24/2018  . Hypotension 08/24/2018  . Acute metabolic encephalopathy 84/69/6295  . Aggressive behavior of adult 07/04/2018  . Adverse drug reaction 12/25/2017  . Closed pelvic fracture ([place]) 12/18/2017  . Closed fracture of multiple pubic rami ([place]) 12/18/2017  . CKD (chronic kidney disease) stage 3, GFR 30-59 ml/min ([facility]) 12/18/2017  . Internal hemorrhoids with complication with suspected prolapse 02/16/2014  . Cancer of upper-outer quadrant of female breast ([place]) 06/16/2012  . Hemorrhoids, internal, thrombosed, Left lateral  02/10/2012  . CONSTIPATION 04/18/2008  . HYPERLIPIDEMIA 04/15/2008  . Hypertension 04/15/2008  . TRANSIENT ISCHEMIC ATTACK 04/15/2008  . ESOPHAGEAL STRICTURE 04/15/2008  . GERD 04/15/2008  . ARTHRITIS 04/15/2008    Past Surgical History:  Procedure Laterality Date  . ABDOMINAL HYSTERECTOMY    . BREAST LUMPECTOMY  2010   left  . CHOLECYSTECTOMY  2002  . CYSTECTOMY  in her 47's  . HEEL SPUR SURGERY     right  . MASTECTOMY, PARTIAL     left  . TONSILLECTOMY       OB History   No obstetric history on file.      Home Medications    Prior to Admission medications   Medication Sig Start Date End Date Taking? Authorizing Provider  acetaminophen (TYLENOL) 500 MG tablet Take 500 mg by mouth every 4 (four) hours as needed for mild pain or headache.     [provider]  alum & mag hydroxide-simeth (MINTOX REGULAR STRENGTH) 200-200-20 MG/5ML suspension Take 30 mLs by mouth every 6 (six) hours as needed for indigestion or heartburn.    [provider]  amLODipine (NORVASC) 10 MG tablet Take 10 mg by mouth daily.    [provider]  cholecalciferol (VITAMIN D) 25 MCG (1000 UT) tablet Take 1,000 Units by mouth daily.     [provider]  divalproex (DEPAKOTE SPRINKLE) 125 MG capsule Take 250 mg by mouth every 12 (twelve)  hours. 8am/8pm    [provider]  furosemide (LASIX) 40 MG tablet Take 1 tablet (40 mg total) by mouth daily. Patient not taking: Reported on 10/28/2018 01/09/18   Medina-Vargas, Monina C, NP  guaifenesin (ROBAFEN) 100 MG/5ML syrup Take 200 mg by mouth every 6 (six) hours as needed for cough.    [provider]  loperamide (IMODIUM) 2 MG capsule Take 2 mg by mouth as needed for diarrhea or loose stools.    [provider]  LORazepam (ATIVAN) 0.5 MG tablet Take 0.5 mg by mouth every 6 (six) hours as needed for anxiety.    [provider]  magnesium hydroxide (MILK OF MAGNESIA) 400 MG/5ML suspension Take  30 mLs by mouth at bedtime as needed for mild constipation.    [provider]  metoprolol tartrate (LOPRESSOR) 50 MG tablet Take 50 mg by mouth 2 (two) times daily. Take 50mg  by mouth twice daily with meals. Hold for systolic blood pressure less than 115 or heart rate less than 60. Pt takes at 0800 and 20:00    [provider]  neomycin-bacitracin-polymyxin (NEOSPORIN) ointment Apply 1 application topically every 12 (twelve) hours.    [provider]  OLANZapine (ZYPREXA) 5 MG tablet Take 5 mg by mouth at bedtime.    [provider]  potassium chloride SA (K-DUR,KLOR-CON) 20 MEQ tablet Take 1 tablet (20 mEq total) by mouth daily. 01/09/18   Medina-Vargas, Monina C, NP  psyllium (METAMUCIL SMOOTH TEXTURE) 28 % packet Take 1 packet by mouth 2 (two) times daily. 02/16/14   Stark Klein, MD  traZODone (DESYREL) 50 MG tablet Take 50 mg by mouth at bedtime.    [provider]    Family History Family History  Problem Relation Age of Onset  . Lung cancer Brother   . Cervical cancer Sister   . Hypertension Father   . Colon cancer Neg Hx   . Esophageal cancer Neg Hx   . Rectal cancer Neg Hx   . Stomach cancer Neg Hx     Social History Social History   Tobacco Use  . Smoking status: Never Smoker  . Smokeless tobacco: Never Used  Substance Use Topics  . Alcohol use: No  . Drug use: No     Allergies   Hydrocodone; Ibandronate sodium; Latex; Sulfonamide derivatives; and Tessalon [benzonatate]   Review of Systems Review of Systems  Unable to perform ROS: Dementia     Physical Exam Updated Vital Signs BP (!) 156/78 (BP Location: Left Arm)   Pulse 76   Temp (!) 97.4 F (36.3 C) (Axillary)   Resp 17   SpO2 96%   Physical Exam Vitals signs and nursing note reviewed.  Constitutional:      General: She is not in acute distress.    Appearance: She is well-developed.     Comments: Elderly female sitting in the chair in no acute  discomfort.  HENT:     Head:     Comments: Dry blood noted to vertex of scalp.  No facial trauma, no midface tenderness. Eyes:     Conjunctiva/sclera: Conjunctivae normal.     Pupils: Pupils are equal, round, and reactive to light.  Neck:     Musculoskeletal: Neck supple.     Comments: Aspen collar in place.  Mild tenderness to palpation of posterior neck, no crepitus of step off. Cardiovascular:     Rate and Rhythm: Normal rate and regular rhythm.     Pulses: Normal pulses.  Heart sounds: Normal heart sounds.  Abdominal:     Palpations: Abdomen is soft.  Musculoskeletal:     Comments: Able to move all 4 extremities, no focal tenderness to the extremities.  Old skin tear to L elbow, no crepitus or decreased ROM.    Skin:    Findings: No rash.  Neurological:     Mental Status: She is alert.     Comments: Alert to self only.    Psychiatric:        Mood and Affect: Mood normal.      ED Treatments / Results  Labs (all labs ordered are listed, but only abnormal results are displayed) Labs Reviewed  URINALYSIS, ROUTINE W REFLEX MICROSCOPIC - Abnormal; Notable for the following components:      Result Value   Leukocytes,Ua MODERATE (*)    Bacteria, UA RARE (*)    All other components within normal limits  URINE CULTURE    EKG None  Radiology Ct Head Wo Contrast  Result Date: 12/03/2018 CLINICAL DATA:  Fall.  Hit head on the window sill. EXAM: CT HEAD WITHOUT CONTRAST CT CERVICAL SPINE WITHOUT CONTRAST TECHNIQUE: Multidetector CT imaging of the head and cervical spine was performed following the standard protocol without intravenous contrast. Multiplanar CT image reconstructions of the cervical spine were also generated. COMPARISON:  CT head and cervical spine dated October 28, 2018. FINDINGS: CT HEAD FINDINGS Brain: No evidence of acute infarction, hemorrhage, hydrocephalus, extra-axial collection or mass lesion/mass effect. Stable atrophy and chronic microvascular ischemic  changes. Vascular: Atherosclerotic vascular calcification of the carotid siphons. No hyperdense vessel. Skull: Negative for fracture or focal lesion. Sinuses/Orbits: No acute finding. Other: Small foci of subcutaneous emphysema over the posterior vertex, consistent with reported laceration. CT CERVICAL SPINE FINDINGS Alignment: Normal. Skull base and vertebrae: No acute fracture. No primary bone lesion or focal pathologic process. Soft tissues and spinal canal: No prevertebral fluid or swelling. No visible canal hematoma. Disc levels: Unchanged mild to moderate multilevel disc height loss and facet uncovertebral hypertrophy. Upper chest: Negative. Other: None. IMPRESSION: 1. No acute intracranial abnormality. Small soft tissue laceration along the posterior skull vertex. 2.  No acute cervical spine fracture.  Stable cervical spondylosis. Electronically Signed   By: Titus Dubin M.D.   On: 12/03/2018 17:07   Ct Cervical Spine Wo Contrast  Result Date: 12/03/2018 CLINICAL DATA:  Fall.  Hit head on the window sill. EXAM: CT HEAD WITHOUT CONTRAST CT CERVICAL SPINE WITHOUT CONTRAST TECHNIQUE: Multidetector CT imaging of the head and cervical spine was performed following the standard protocol without intravenous contrast. Multiplanar CT image reconstructions of the cervical spine were also generated. COMPARISON:  CT head and cervical spine dated October 28, 2018. FINDINGS: CT HEAD FINDINGS Brain: No evidence of acute infarction, hemorrhage, hydrocephalus, extra-axial collection or mass lesion/mass effect. Stable atrophy and chronic microvascular ischemic changes. Vascular: Atherosclerotic vascular calcification of the carotid siphons. No hyperdense vessel. Skull: Negative for fracture or focal lesion. Sinuses/Orbits: No acute finding. Other: Small foci of subcutaneous emphysema over the posterior vertex, consistent with reported laceration. CT CERVICAL SPINE FINDINGS Alignment: Normal. Skull base and vertebrae: No  acute fracture. No primary bone lesion or focal pathologic process. Soft tissues and spinal canal: No prevertebral fluid or swelling. No visible canal hematoma. Disc levels: Unchanged mild to moderate multilevel disc height loss and facet uncovertebral hypertrophy. Upper chest: Negative. Other: None. IMPRESSION: 1. No acute intracranial abnormality. Small soft tissue laceration along the posterior skull vertex. 2.  No  acute cervical spine fracture.  Stable cervical spondylosis. Electronically Signed   By: Titus Dubin M.D.   On: 12/03/2018 17:07    Procedures .Marland KitchenLaceration Repair Date/Time: 12/03/2018 5:57 PM Performed by: Domenic Moras, PA-C Authorized by: Domenic Moras, PA-C   Consent:    Consent obtained:  Verbal   Consent given by:  Patient   Risks discussed:  Infection, need for additional repair, pain, poor cosmetic result and poor wound healing   Alternatives discussed:  No treatment and delayed treatment Universal protocol:    Procedure explained and questions answered to patient or proxy's satisfaction: yes     Relevant documents present and verified: yes     Test results available and properly labeled: yes     Imaging studies available: yes     Required blood products, implants, devices, and special equipment available: yes     Site/side marked: yes     Immediately prior to procedure, a time out was called: yes     Patient identity confirmed:  Verbally with patient Anesthesia (see MAR for exact dosages):    Anesthesia method:  Local infiltration and topical application   Topical anesthetic:  LET Laceration details:    Location:  Scalp   Scalp location:  Crown   Length (cm):  1   Depth (mm):  3 Repair type:    Repair type:  Simple Pre-procedure details:    Preparation:  Patient was prepped and draped in usual sterile fashion and imaging obtained to evaluate for foreign bodies Exploration:    Hemostasis achieved with:  LET   Wound exploration: wound explored through full  range of motion and entire depth of wound probed and visualized     Contaminated: no   Treatment:    Area cleansed with:  Betadine   Amount of cleaning:  Standard   Visualized foreign bodies/material removed: no   Skin repair:    Repair method:  Staples   Number of staples:  2 Approximation:    Approximation:  Close Post-procedure details:    Dressing:  Open (no dressing)   Patient tolerance of procedure:  Tolerated well, no immediate complications   (including critical care time)  Medications Ordered in ED Medications  cephALEXin (KEFLEX) capsule 500 mg (has no administration in time range)  lidocaine-EPINEPHrine-tetracaine (LET) solution (3 mLs Topical Given 12/03/18 1705)     Initial Impression / Assessment and Plan / ED Course  I have reviewed the triage vital signs and the nursing notes.  Pertinent labs & imaging results that were available during my care of the patient were reviewed by me and considered in my medical decision making (see chart for details).        BP (!) 156/78 (BP Location: Left Arm)   Pulse 76   Temp (!) 97.4 F (36.3 C) (Axillary)   Resp 17   SpO2 96%    Final Clinical Impressions(s) / ED Diagnoses   Final diagnoses:  Fall at nursing home, initial encounter  Laceration of scalp, initial encounter  Lower urinary tract infectious disease    ED Discharge Orders         Ordered    cephALEXin (KEFLEX) 500 MG capsule  4 times daily     12/03/18 1904         3:58 PM Pt with hx of dementia here with witness fall when she fell forward while sitting on a chair, striking head against door sill.  She has some dry blood to vertex of scalp, no  other injury noted.  Nursing note mentioned skin lac to L elbow however it appeared to be an old skin tear, no tenderness.  Will obtain head CT, cervical spine CT and UA.  Will perform lac repair of scalp lac.   5:58 PM Lac repaired with surgical staple.  Will need removal in 7 days. I also discussed care  with Pt's son via phone to give an update on her condition.  UA shows evidence concerning for UTI, will treat with keflex.  Urine culture sent.  Pt otherwise stable for discharge.  Care discussed with DR. Nanavati.    Domenic Moras, PA-C 12/03/18 1906    Varney Biles, MD 12/03/18 [id number]    Varney Biles, MD 12/03/18 [phone]

## 2018-12-03 NOTE — ED Notes (Signed)
[facility] contacted for transport back to [facility].

## 2018-12-03 NOTE — ED Notes (Signed)
Attempted to call [facility] at [phone] without answer, no voicemail available to leave a message on.

## 2018-12-05 LAB — URINE CULTURE

## 2019-03-29 IMAGING — CR DG FEMUR 2+V*R*
4 series · 4 of 4 positions shown · non-contrast
Comparison: None.

CLINICAL DATA: Fall.  Right leg pain.

EXAM:
RIGHT FEMUR 2 VIEWS

[x femur proximal ap right (1 of 3)]
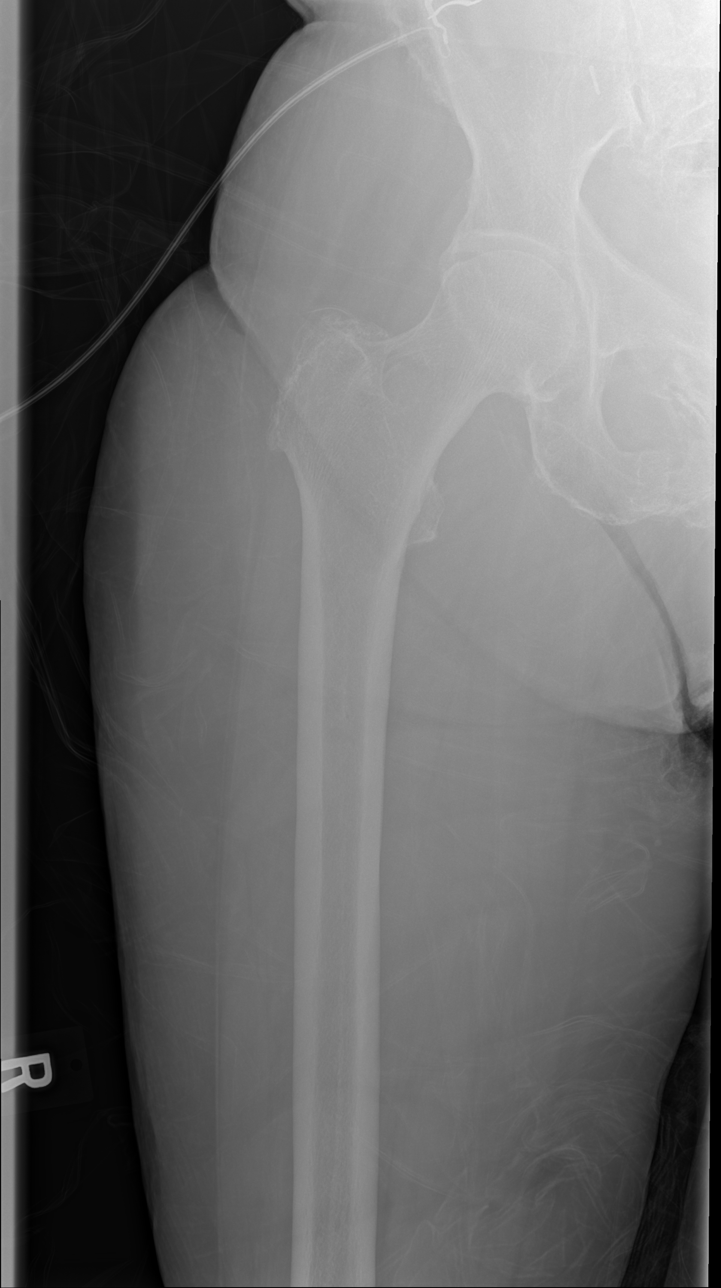

[x femur proximal ap right (2 of 3)]
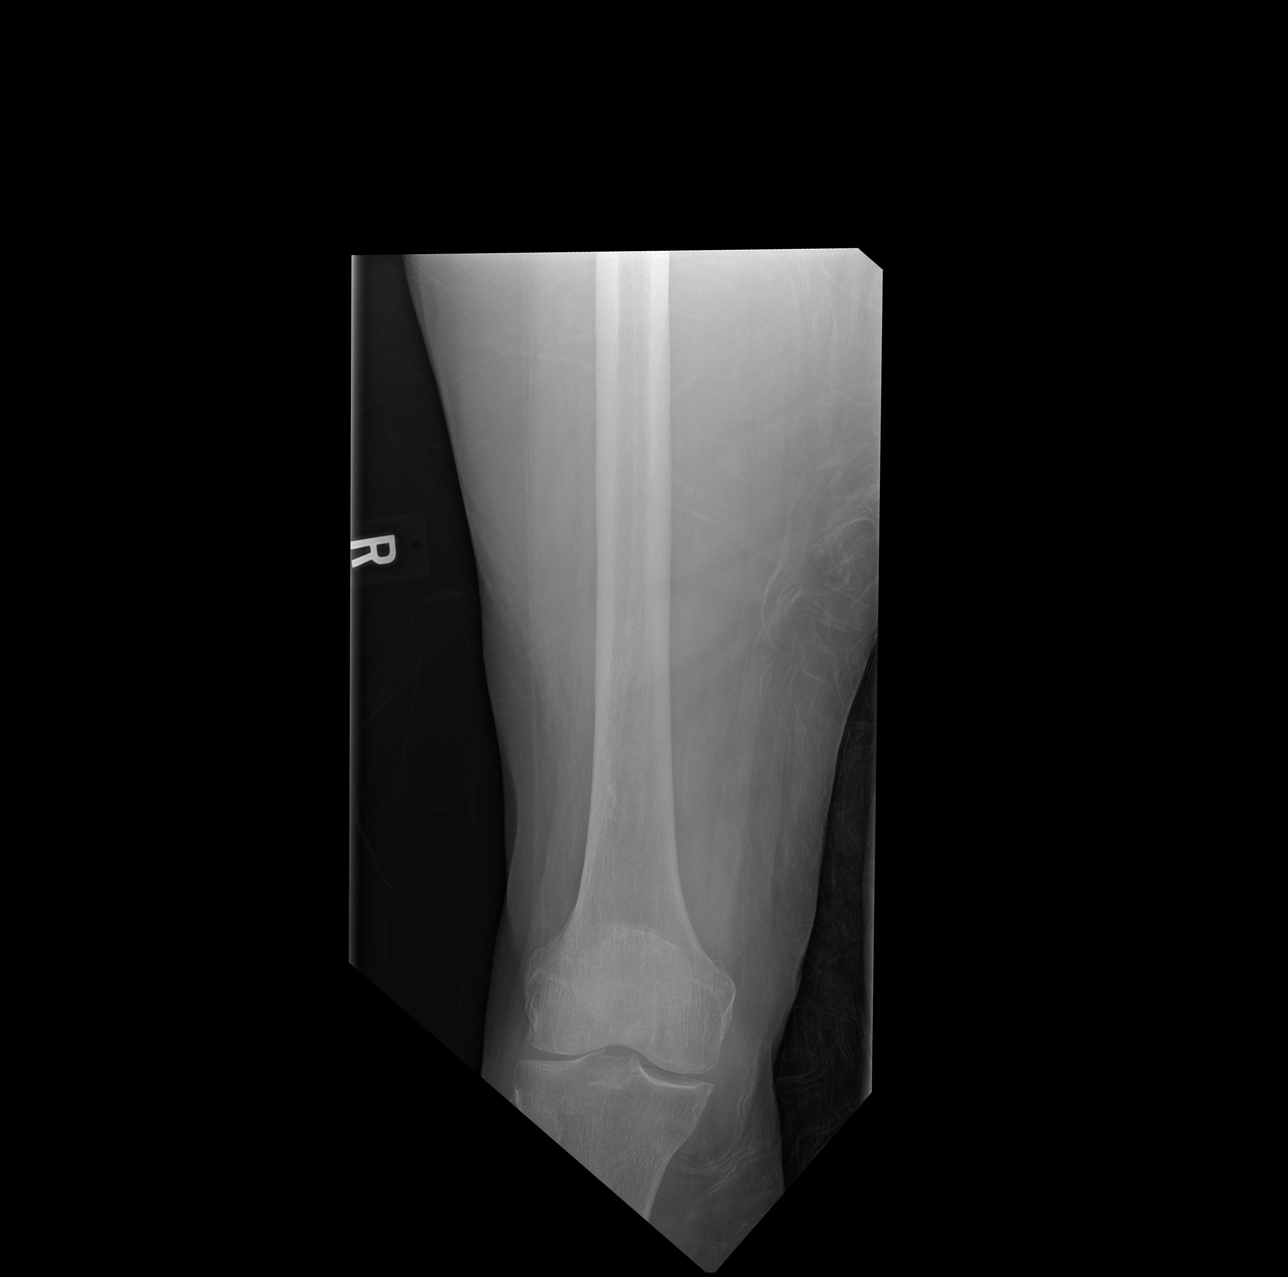

[x femur proximal ap right (3 of 3)]
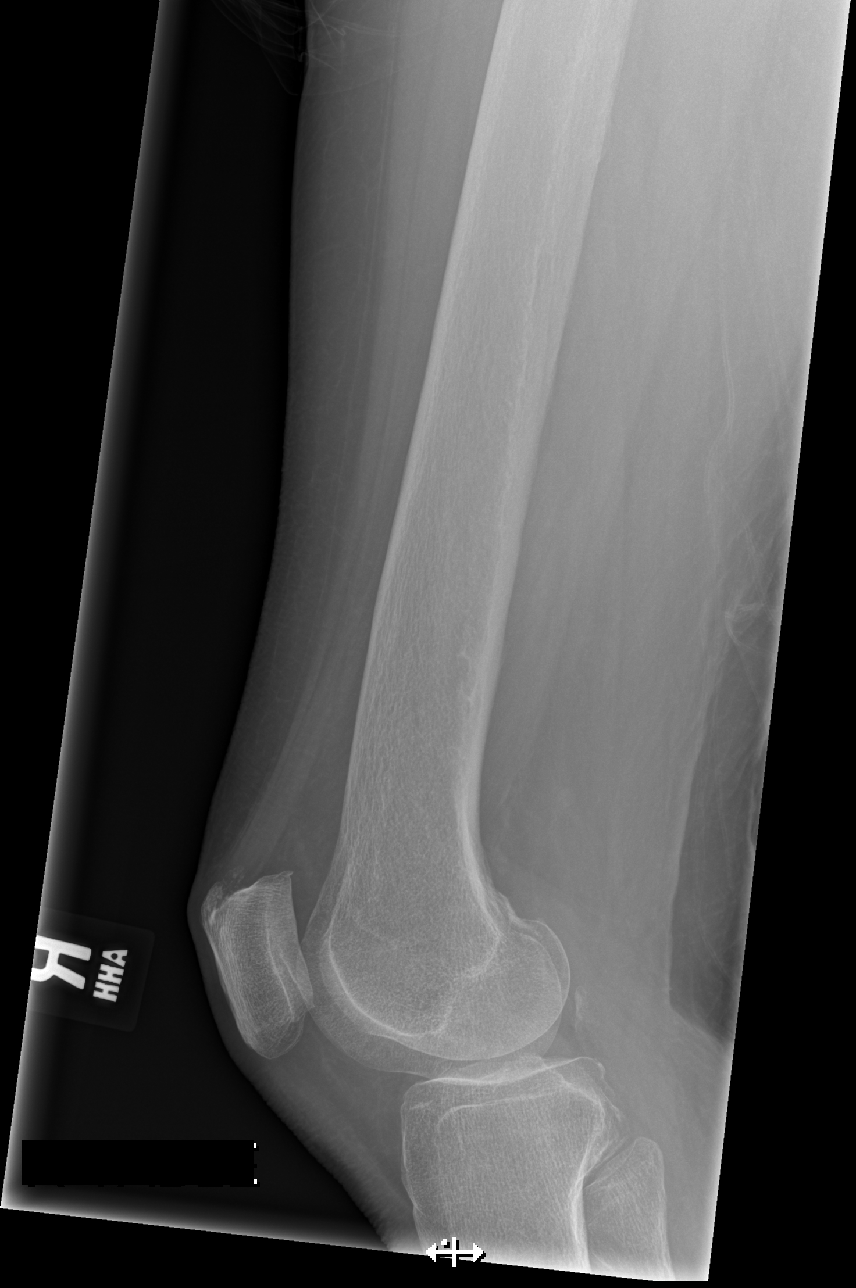

[w femur distal lat right]
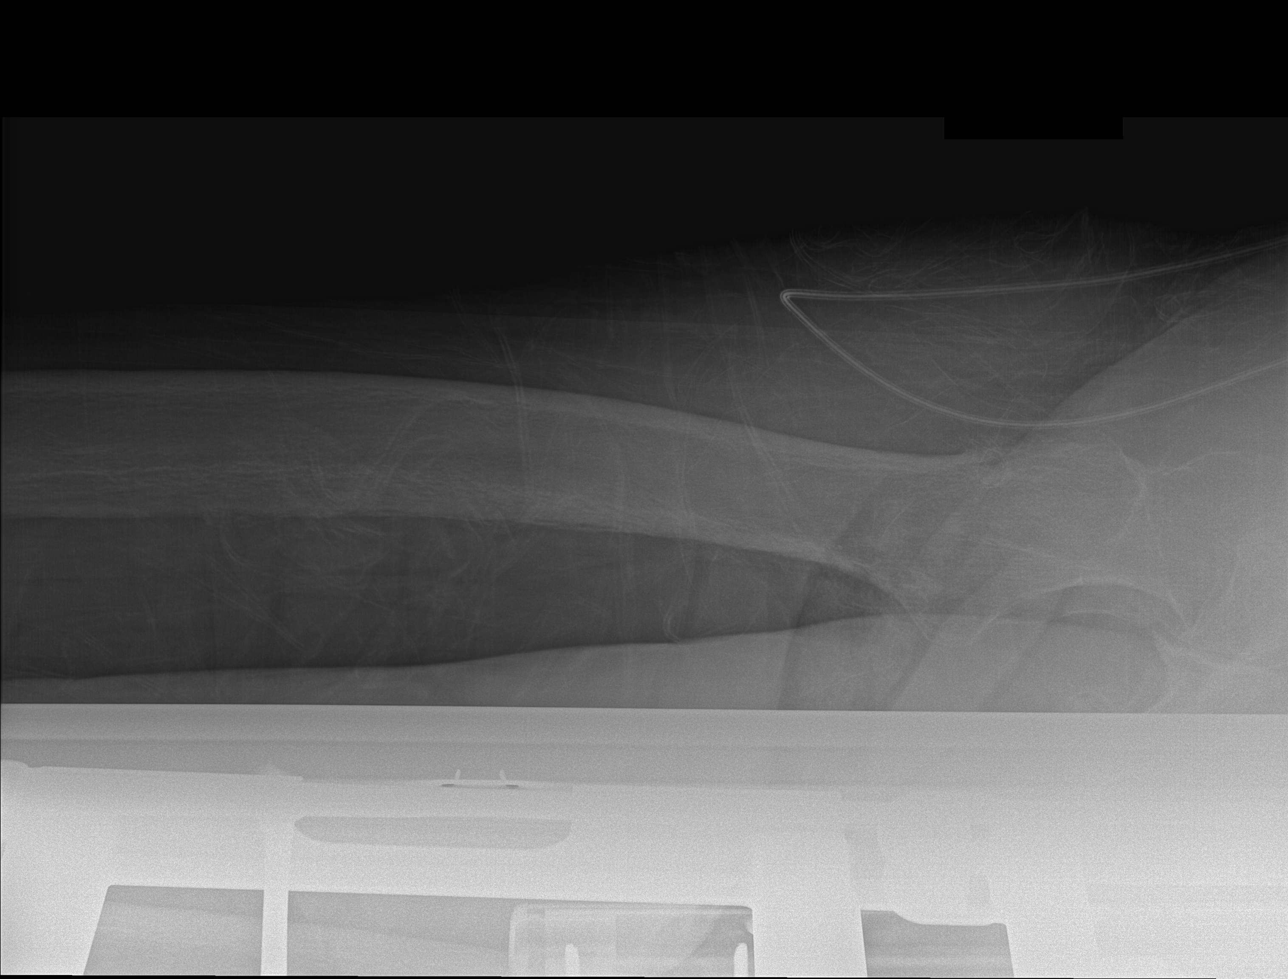

[4 of 4 positions shown; findings below may reference images not displayed]

FINDINGS: Mild degenerative changes in the right hip and right knee. No joint
effusion. No acute bony abnormality. Specifically, no fracture,
subluxation, or dislocation.
IMPRESSION: No acute bony abnormality.

## 2019-04-07 DEATH — deceased

## 2019-04-28 IMAGING — CT CT CERVICAL SPINE W/O CM
5 of 10 series · 10 of 33 positions shown, 11 images · non-contrast
Comparison: Head CTs 12/24/2017 and earlier.

CLINICAL DATA: [AGE] female with unwitnessed fall, found
down. Confusion.

EXAM:
CT HEAD WITHOUT CONTRAST
CT MAXILLOFACIAL WITHOUT CONTRAST
CT CERVICAL SPINE WITHOUT CONTRAST
TECHNIQUE: Multidetector CT imaging of the head, cervical spine, and
maxillofacial structures were performed using the standard protocol
without intravenous contrast. Multiplanar CT image reconstructions
of the cervical spine and maxillofacial structures were also
generated.

[Series 9: c spine soft · axial · 0.23mm/px · z∈[-258,-208]mm · 2 of 77 slices shown]
[im 26/77  soft-tissue]
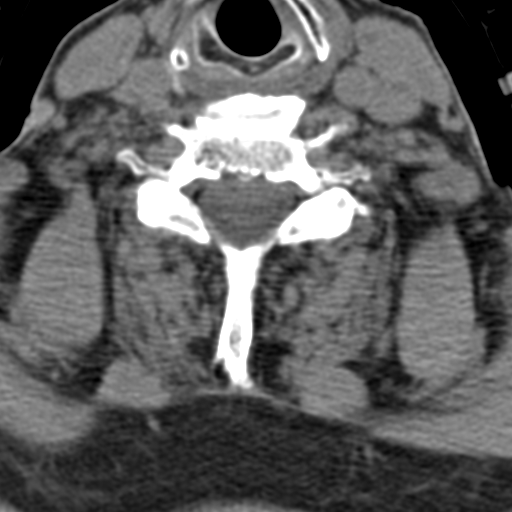
[im 51/77  soft-tissue]
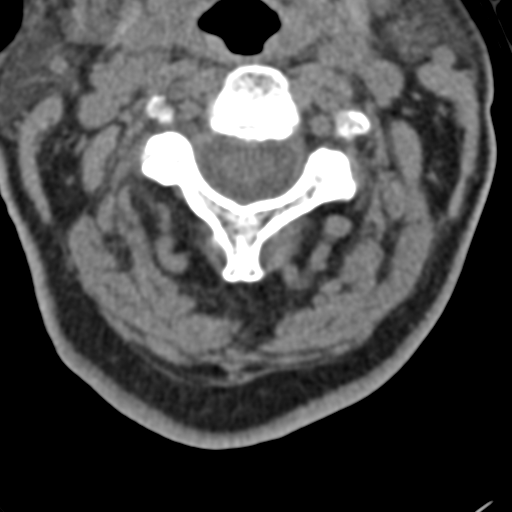

[Series 10: orthogonal axials · axial · 0.23mm/px · z∈[-307,-223]mm · 3 of 92 slices shown, 4 images]
[im 23/92  soft-tissue]
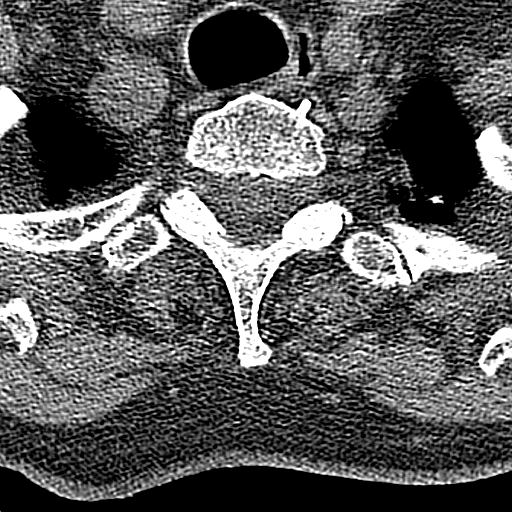
[im 23/92  bone]
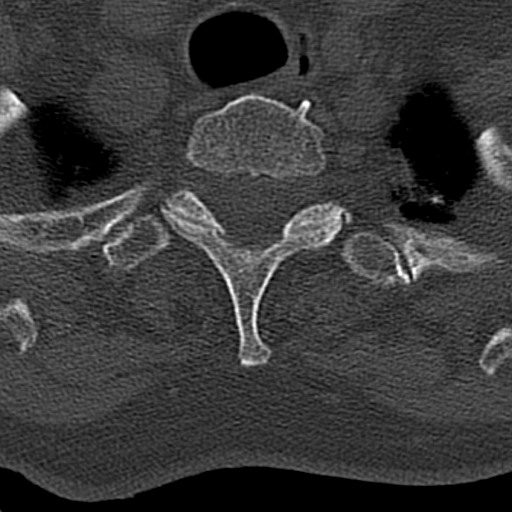
[im 46/92  bone]
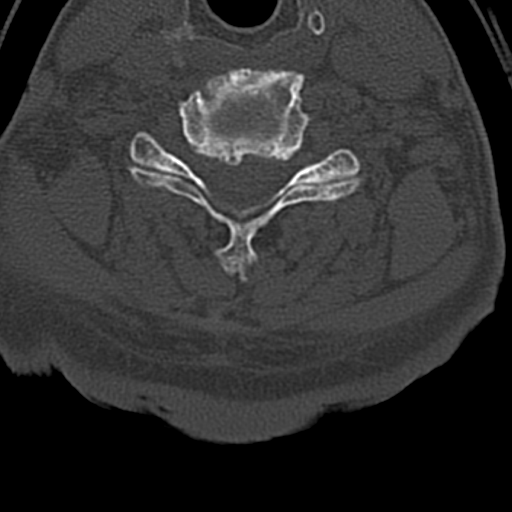
[im 69/92  bone]
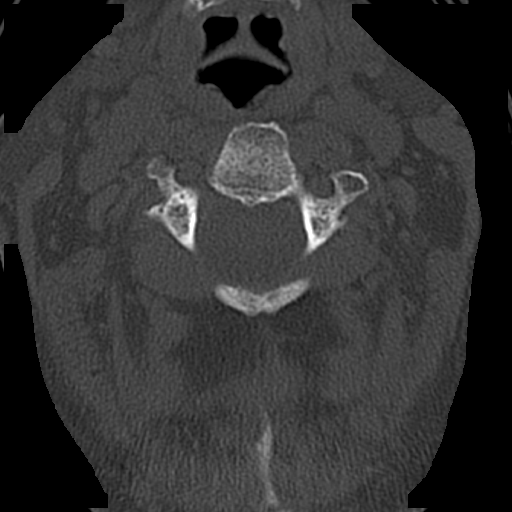

[Series 11: coronal bone · coronal · 0.26mm/px · 2 of 57 slices shown]
[im 19/57  bone]
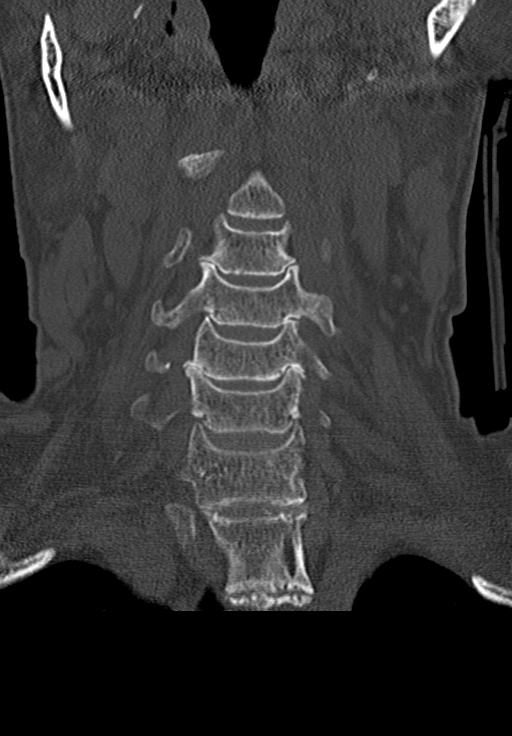
[im 38/57  bone]
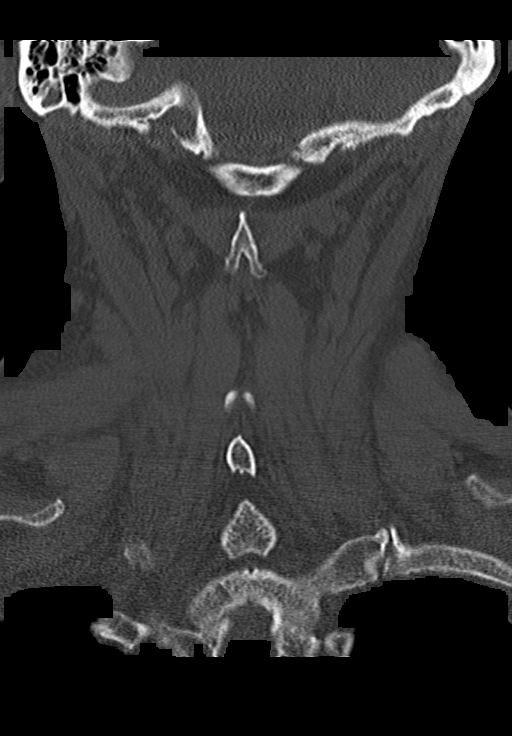

[Series 12: sagittal bone · sagittal · 0.33mm/px · 1 of 38 slices shown]
[im 19/38  bone]
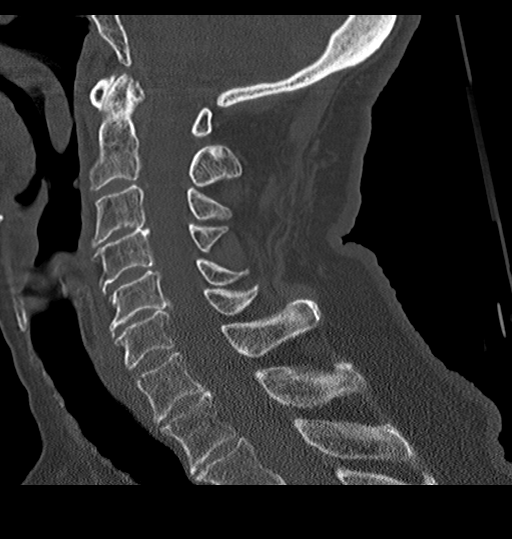

[Series 14: max soft · axial · 0.33mm/px · z∈[-171,-121]mm · 2 of 77 slices shown]
[im 26/77  soft-tissue]
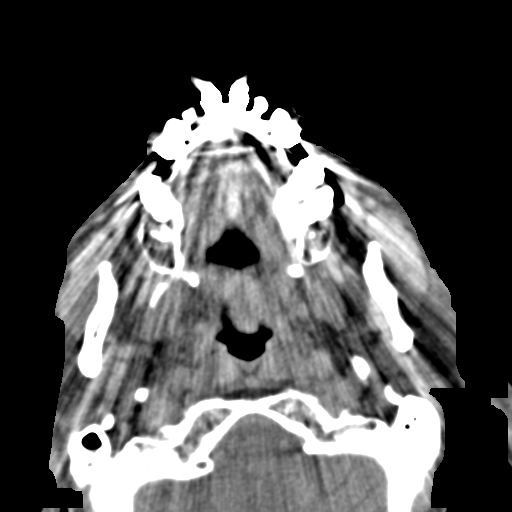
[im 51/77  soft-tissue]
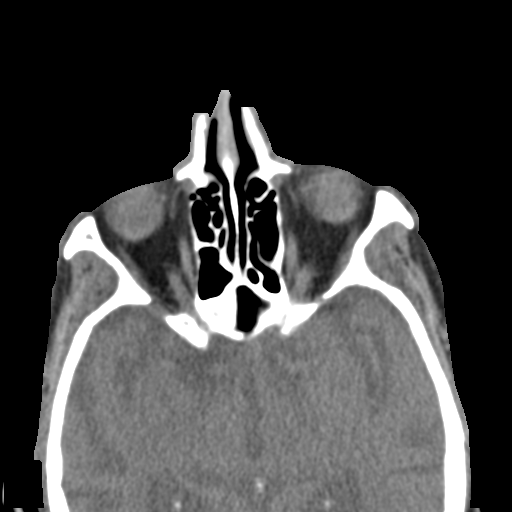

[10 of 33 positions shown; findings below may reference images not displayed]

FINDINGS: CT HEAD FINDINGS

Brain: Stable cerebral volume. No midline shift, mass effect, or
evidence of intracranial mass lesion. No ventriculomegaly.

No acute intracranial hemorrhage identified.

Patchy bilateral white matter hypodensity and chronic lacunar
infarct of the left basal ganglia appear stable. No cortical
encephalomalacia or acute cortically based infarct identified.

Vascular: Mild Calcified atherosclerosis at the skull base. No
suspicious intracranial vascular hyperdensity.

Skull: Stable, intact.

Other: Left anterior convexity scalp hematoma measures up to 7
millimeters in thickness. No scalp soft tissue gas. Underlying left
frontal bone intact. Other scalp soft tissues appear stable and
within normal limits.

CT MAXILLOFACIAL FINDINGS

Osseous: Mandible intact. Intact maxilla and zygoma. No definite
nasal bone fracture. Central skull base intact.

Orbits: Chronic thinning or dehiscence of a small focal segment of
the left orbital roof is unchanged (series 20, image 34). Otherwise
the orbital walls are intact. Orbits soft tissues appears stable and
negative.

Beginning just above the left orbit there is left forehead and scalp
hematoma, as described above.

Sinuses: Visualized paranasal sinuses and mastoids are stable and
well pneumatized. Tympanic cavities are clear.

Soft tissues: Negative visible noncontrast deep soft tissue spaces
of the face. No other superficial soft tissue injury identified
outside of the left forehead and scalp.

CT CERVICAL SPINE FINDINGS

Alignment: Preserved cervical lordosis. Cervicothoracic junction
alignment is within normal limits. Bilateral posterior element
alignment is within normal limits.

Skull base and vertebrae: Visualized skull base is intact. No
atlanto-occipital dissociation. No cervical spine fracture
identified.

Soft tissues and spinal canal: No prevertebral fluid or swelling. No
visible canal hematoma. Negative noncontrast neck soft tissues.

Disc levels: Mild for age cervical spine degeneration, with isolated
borderline to mild degenerative spinal stenosis at C5-C6.

Upper chest: Visible upper thoracic levels appear intact. Negative
lung apices. Small volume of gas in the left lower IJ is probably
related to recent intravenous access.
IMPRESSION: 1. Left forehead and scalp hematoma without underlying fracture.
2. No other acute traumatic injury identified.
3.  Stable non contrast CT appearance of the brain since [DATE]. Mild for age cervical spine degeneration.
